# Patient Record
Sex: Male | Born: 1970
Health system: Southern US, Community
[De-identification: ages and names within clinical notes are randomized; demographics above are authoritative.]

## PROBLEM LIST (undated history)

## (undated) DIAGNOSIS — K219 Gastro-esophageal reflux disease without esophagitis: Secondary | ICD-10-CM

## (undated) DIAGNOSIS — E669 Obesity, unspecified: Secondary | ICD-10-CM

## (undated) DIAGNOSIS — E119 Type 2 diabetes mellitus without complications: Secondary | ICD-10-CM

## (undated) DIAGNOSIS — M1990 Unspecified osteoarthritis, unspecified site: Secondary | ICD-10-CM

## (undated) DIAGNOSIS — I10 Essential (primary) hypertension: Secondary | ICD-10-CM

## (undated) DIAGNOSIS — Z972 Presence of dental prosthetic device (complete) (partial): Secondary | ICD-10-CM

## (undated) DIAGNOSIS — Z9989 Dependence on other enabling machines and devices: Secondary | ICD-10-CM

## (undated) DIAGNOSIS — G4733 Obstructive sleep apnea (adult) (pediatric): Secondary | ICD-10-CM

## (undated) HISTORY — DX: Obesity, unspecified: E66.9

## (undated) HISTORY — DX: Essential (primary) hypertension: I10

## (undated) HISTORY — PX: KNEE ARTHROSCOPY W/ ACL RECONSTRUCTION: SHX1858

---

## 1998-06-23 ENCOUNTER — Emergency Department (HOSPITAL_COMMUNITY): Admission: EM | Admit: 1998-06-23 | Discharge: 1998-06-23 | Payer: Self-pay | Admitting: Emergency Medicine

## 2009-10-23 ENCOUNTER — Encounter: Admission: RE | Admit: 2009-10-23 | Discharge: 2009-10-23 | Payer: Self-pay | Admitting: Obstetrics and Gynecology

## 2009-11-08 ENCOUNTER — Encounter: Admission: RE | Admit: 2009-11-08 | Discharge: 2009-11-08 | Payer: Self-pay | Admitting: Orthopedic Surgery

## 2009-11-24 ENCOUNTER — Ambulatory Visit (HOSPITAL_COMMUNITY): Admission: RE | Admit: 2009-11-24 | Discharge: 2009-11-25 | Payer: Self-pay | Admitting: Orthopedic Surgery

## 2011-01-12 ENCOUNTER — Encounter: Payer: Self-pay | Admitting: Internal Medicine

## 2011-01-12 ENCOUNTER — Other Ambulatory Visit: Payer: Self-pay | Admitting: Internal Medicine

## 2011-01-12 ENCOUNTER — Ambulatory Visit
Admission: RE | Admit: 2011-01-12 | Discharge: 2011-01-12 | Payer: Self-pay | Source: Home / Self Care | Attending: Internal Medicine | Admitting: Internal Medicine

## 2011-01-12 DIAGNOSIS — R5383 Other fatigue: Secondary | ICD-10-CM | POA: Insufficient documentation

## 2011-01-12 DIAGNOSIS — G4733 Obstructive sleep apnea (adult) (pediatric): Secondary | ICD-10-CM | POA: Insufficient documentation

## 2011-01-12 DIAGNOSIS — I10 Essential (primary) hypertension: Secondary | ICD-10-CM | POA: Insufficient documentation

## 2011-01-12 DIAGNOSIS — R5381 Other malaise: Secondary | ICD-10-CM | POA: Insufficient documentation

## 2011-01-12 LAB — LIPID PANEL
HDL: 50.7 mg/dL (ref >39.00)
Total CHOL/HDL Ratio: 3
Triglycerides: 65 mg/dL (ref 0.0–149.0)

## 2011-01-12 LAB — CBC WITH DIFFERENTIAL/PLATELET
Basophils Absolute: 0 10*3/uL (ref 0.0–0.1)
Eosinophils Absolute: 0.2 10*3/uL (ref 0.0–0.7)
Lymphocytes Relative: 25.2 % (ref 12.0–46.0)
MCHC: 34.8 g/dL (ref 30.0–36.0)
Neutrophils Relative %: 64.5 % (ref 43.0–77.0)
RDW: 13.9 % (ref 11.5–14.6)

## 2011-01-12 LAB — HEPATIC FUNCTION PANEL
Albumin: 4.1 g/dL (ref 3.5–5.2)
Alkaline Phosphatase: 49 U/L (ref 39–117)
Bilirubin, Direct: 0.1 mg/dL (ref 0.0–0.3)

## 2011-01-12 LAB — BASIC METABOLIC PANEL
CO2: 31 mEq/L (ref 19–32)
Calcium: 9.6 mg/dL (ref 8.4–10.5)
Creatinine, Ser: 1.1 mg/dL (ref 0.4–1.5)
Glucose, Bld: 93 mg/dL (ref 70–99)

## 2011-01-12 LAB — PSA: PSA: 1.15 ng/mL (ref 0.10–4.00)

## 2011-01-13 ENCOUNTER — Encounter: Payer: Self-pay | Admitting: Internal Medicine

## 2011-01-14 ENCOUNTER — Telehealth: Payer: Self-pay | Admitting: Internal Medicine

## 2011-01-20 NOTE — Assessment & Plan Note (Signed)
Summary: new / cigna / # cd   Vital Signs:  Patient profile:   40 year old male Height:      68 inches Weight:      370 pounds BMI:     56.46 O2 Sat:      97 % on Room air Temp:     97.9 degrees F oral Pulse rate:   74 / minute Pulse rhythm:   regular Resp:     16 per minute BP sitting:   138 / 84  (left arm) Cuff size:   large  Vitals Entered By: Estell Harpin CMA (January 12, 2011 10:42 AM)  Nutrition Counseling: Patient's BMI is greater than 25 and therefore counseled on weight management options.  O2 Flow:  Room air CC: Fatigue, Hypertension Management   Primary Care Provider:  Janith Lima MD  CC:  Fatigue and Hypertension Management.  History of Present Illness:  Fatigue      This is a 40 year old man who presents with Fatigue.  The symptoms began 4-8 weeks ago.  The severity is described as mild.  The patient reports persistent fatigue, fatigue without physical limitations, and primarily motivational fatigue, but denies fatigue with vigorous exertion, fatigue with moderate exertion, fatigue with minimal exertion, and primarily physical fatigue.  The patient denies fever, night sweats, weight loss, exertional chest pain, dyspnea, cough, hemoptysis, and new medications.  Other symptoms include severe snoring and daytime sleepiness.  The patient denies the following symptoms: leg swelling, orthopnea, PND, melena, adenopathy, and skin changes.  The patient denies anhedonia, feeling depressed, altered appetite, and poor sleep.    Hypertension History:      He denies headache, chest pain, palpitations, dyspnea with exertion, orthopnea, PND, peripheral edema, visual symptoms, neurologic problems, syncope, and side effects from treatment.  He notes no problems with any antihypertensive medication side effects.        Positive major cardiovascular risk factors include hypertension.  Negative major cardiovascular risk factors include male age less than 83 years old, no history of  diabetes or hyperlipidemia, negative family history for ischemic heart disease, and non-tobacco-user status.        Further assessment for target organ damage reveals no history of ASHD, cardiac end-organ damage (CHF/LVH), stroke/TIA, peripheral vascular disease, renal insufficiency, or hypertensive retinopathy.    Preventive Screening-Counseling & Management  Alcohol-Tobacco     Alcohol drinks/day: 0     Alcohol Counseling: not indicated; patient does not drink     Smoking Status: never     Tobacco Counseling: not indicated; no tobacco use  Caffeine-Diet-Exercise     Does Patient Exercise: no  Hep-HIV-STD-Contraception     Hepatitis Risk: no risk noted     HIV Risk: no risk noted     STD Risk: no risk noted     Dental Visit-last 6 months yes     Dental Care Counseling: to seek dental care; no dental care within six months     TSE monthly: no     Testicular SE Education/Counseling to perform regular STE     Sun Exposure-Excessive: no  Safety-Violence-Falls     Seat Belt Use: yes     Helmet Use: n/a     Firearms in the Home: no firearms in the home     Smoke Detectors: yes     Violence in the Home: no risk noted      Sexual History:  currently monogamous.        Drug Use:  no.        Blood Transfusions:  no.    Clinical Review Panels:  Immunizations   Last Tetanus Booster:  Historical (12/13/2008)   Current Medications (verified): 1)  Benazepril Hcl 20 Mg Tabs (Benazepril Hcl) .... Take 1 Tablet By Mouth Once A Day  Allergies (verified): No Known Drug Allergies  Past History:  Past Medical History: Hypertension  Past Surgical History: right ACL repair  Family History: Family History of Aneurysm Aortic Family History of Arthritis Family History Breast cancer 1st degree relative <50 Family History Hypertension Family History of Prostate CA 1st degree relative <50  Social History: Married Never Smoked Alcohol use-no Drug use-no Regular  exercise-no Smoking Status:  never Drug Use:  no Does Patient Exercise:  no Hepatitis Risk:  no risk noted HIV Risk:  no risk noted STD Risk:  no risk noted Dental Care w/in 6 mos.:  yes Sun Exposure-Excessive:  no Seat Belt Use:  yes Sexual History:  currently monogamous Blood Transfusions:  no  Review of Systems       The patient complains of weight gain.  The patient denies anorexia, fever, weight loss, chest pain, syncope, dyspnea on exertion, peripheral edema, prolonged cough, headaches, hemoptysis, abdominal pain, melena, hematochezia, severe indigestion/heartburn, hematuria, muscle weakness, suspicious skin lesions, transient blindness, difficulty walking, depression, unusual weight change, abnormal bleeding, enlarged lymph nodes, angioedema, and testicular masses.   Resp:  Complains of excessive snoring, hypersomnolence, and morning headaches; denies chest discomfort, chest pain with inspiration, cough, coughing up blood, pleuritic, shortness of breath, sputum productive, and wheezing. Endo:  Complains of weight change; denies cold intolerance, excessive hunger, excessive thirst, excessive urination, heat intolerance, and polyuria.  Physical Exam  General:  alert, well-developed, well-nourished, well-hydrated, appropriate dress, cooperative to examination, good hygiene, and overweight-appearing.   Head:  normocephalic, atraumatic, no abnormalities observed, and no abnormalities palpated.   Eyes:  vision grossly intact, pupils equal, pupils round, and pupils reactive to light.   Ears:  R ear normal and L ear normal.   Nose:  External nasal examination shows no deformity or inflammation. Nasal mucosa are pink and moist without lesions or exudates. Mouth:  Oral mucosa and oropharynx without lesions or exudates.  Teeth in good repair. Neck:  supple, full ROM, no masses, no thyromegaly, no thyroid nodules or tenderness, no JVD, no HJR, normal carotid upstroke, no carotid bruits, no  cervical lymphadenopathy, and no neck tenderness.   Breasts:  No masses or gynecomastia noted Lungs:  normal respiratory effort, no intercostal retractions, no accessory muscle use, normal breath sounds, no dullness, no fremitus, no crackles, and no wheezes.   Heart:  normal rate, regular rhythm, no murmur, no gallop, no rub, and no JVD.   Abdomen:  soft, non-tender, normal bowel sounds, no distention, no masses, no guarding, no rigidity, no rebound tenderness, no abdominal hernia, no inguinal hernia, no hepatomegaly, and no splenomegaly.   Rectal:  No external abnormalities noted. Normal sphincter tone. No rectal masses or tenderness. Genitalia:  uncircumcised, no hydrocele, no varicocele, no scrotal masses, no testicular masses or atrophy, no cutaneous lesions, and no urethral discharge.   Prostate:  Prostate gland firm and smooth, no enlargement, nodularity, tenderness, mass, asymmetry or induration. Msk:  No deformity or scoliosis noted of thoracic or lumbar spine.   Pulses:  R and L carotid,radial,femoral,dorsalis pedis and posterior tibial pulses are full and equal bilaterally Extremities:  No clubbing, cyanosis, edema, or deformity noted with normal full range of motion of all  joints.   Neurologic:  No cranial nerve deficits noted. Station and gait are normal. Plantar reflexes are down-going bilaterally. DTRs are symmetrical throughout. Sensory, motor and coordinative functions appear intact. Skin:  Intact without suspicious lesions or rashes Cervical Nodes:  no anterior cervical adenopathy and no posterior cervical adenopathy.   Axillary Nodes:  no R axillary adenopathy and no L axillary adenopathy.   Inguinal Nodes:  no R inguinal adenopathy and no L inguinal adenopathy.   Psych:  Cognition and judgment appear intact. Alert and cooperative with normal attention span and concentration. No apparent delusions, illusions, hallucinations   Impression & Recommendations:  Problem # 1:   HYPERTENSION, BENIGN ESSENTIAL (ICD-401.1) Assessment Improved  His updated medication list for this problem includes:    Benazepril Hcl 20 Mg Tabs (Benazepril hcl) .Marland Kitchen... Take 1 tablet by mouth once a day  BP today: 138/84  10 Yr Risk Heart Disease: Not enough information  Orders: Venipuncture (45997) TLB-Lipid Panel (80061-LIPID) TLB-BMP (Basic Metabolic Panel-BMET) (74142-LTRVUYE) TLB-CBC Platelet - w/Differential (85025-CBCD) TLB-Hepatic/Liver Function Pnl (80076-HEPATIC) TLB-TSH (Thyroid Stimulating Hormone) (84443-TSH) TLB-A1C / Hgb A1C (Glycohemoglobin) (83036-A1C) EKG w/ Interpretation (93000)  Problem # 2:  ROUTINE GENERAL MEDICAL EXAM@HEALTH  CARE FACL (ICD-V70.0)  Orders: TLB-PSA (Prostate Specific Antigen) (84153-PSA)  Td Booster: Historical (12/13/2008)    Discussed using sunscreen, use of alcohol, drug use, self testicular exam, routine dental care, routine eye care, routine physical exam, seat belts, multiple vitamins, and recommendations for immunizations.  Discussed exercise and checking cholesterol.  Also recommend checking PSA.  Problem # 3:  SLEEP APNEA (ICD-780.57) Assessment: New  Orders: Venipuncture (33435) TLB-Lipid Panel (80061-LIPID) TLB-BMP (Basic Metabolic Panel-BMET) (68616-OHFGBMS) TLB-CBC Platelet - w/Differential (85025-CBCD) TLB-Hepatic/Liver Function Pnl (80076-HEPATIC) TLB-TSH (Thyroid Stimulating Hormone) (84443-TSH) TLB-A1C / Hgb A1C (Glycohemoglobin) (83036-A1C) Sleep Disorder Referral (Sleep Disorder)  Problem # 4:  OBESITY (ICD-278.00) Assessment: Deteriorated  Orders: Venipuncture (11155) TLB-Lipid Panel (80061-LIPID) TLB-BMP (Basic Metabolic Panel-BMET) (20802-MVVKPQA) TLB-CBC Platelet - w/Differential (85025-CBCD) TLB-Hepatic/Liver Function Pnl (80076-HEPATIC) TLB-TSH (Thyroid Stimulating Hormone) (84443-TSH) TLB-A1C / Hgb A1C (Glycohemoglobin) (83036-A1C)  Ht: 68 (01/12/2011)   Wt: 370 (01/12/2011)   BMI: 56.46  (01/12/2011)  Problem # 5:  FATIGUE (ICD-780.79) Assessment: New will check for secondary causes, I think this will be due to his sleep apnea Orders: Venipuncture (44975) TLB-Lipid Panel (80061-LIPID) TLB-BMP (Basic Metabolic Panel-BMET) (30051-TMYTRZN) TLB-CBC Platelet - w/Differential (85025-CBCD) TLB-Hepatic/Liver Function Pnl (80076-HEPATIC) TLB-TSH (Thyroid Stimulating Hormone) (84443-TSH) TLB-A1C / Hgb A1C (Glycohemoglobin) (83036-A1C)  Complete Medication List: 1)  Benazepril Hcl 20 Mg Tabs (Benazepril hcl) .... Take 1 tablet by mouth once a day  Hypertension Assessment/Plan:      The patient's hypertensive risk group is category A: No risk factors and no target organ damage.  Today's blood pressure is 138/84.  His blood pressure goal is < 140/90.   Patient Instructions: 1)  Please schedule a follow-up appointment in 1 month. 2)  It is important that you exercise regularly at least 20 minutes 5 times a week. If you develop chest pain, have severe difficulty breathing, or feel very tired , stop exercising immediately and seek medical attention. 3)  You need to lose weight. Consider a lower calorie diet and regular exercise.  4)  Check your Blood Pressure regularly. If it is above 140/90: you should make an appointment. Prescriptions: BENAZEPRIL HCL 20 MG TABS (BENAZEPRIL HCL) Take 1 tablet by mouth once a day  #30 x 11   Entered and Authorized by:   Janith Lima MD   Signed  by:   Janith Lima MD on 01/12/2011   Method used:   Electronically to        [facility] [phone]* (retail)       [address]       [place], [place]  [address]       Ph: [phone]       Fax: [phone]   RxID:   [id number]    Orders Added: 1)  Venipuncture [41287] 2)  TLB-Lipid Panel [80061-LIPID] 3)  TLB-BMP (Basic Metabolic Panel-BMET) [86767-MCNOBSJ] 4)  TLB-CBC Platelet - w/Differential [85025-CBCD] 5)  TLB-Hepatic/Liver Function Pnl [80076-HEPATIC] 6)  TLB-TSH  (Thyroid Stimulating Hormone) [84443-TSH] 7)  TLB-A1C / Hgb A1C (Glycohemoglobin) [83036-A1C] 8)  TLB-PSA (Prostate Specific Antigen) [84153-PSA] 9)  Sleep Disorder Referral [Sleep Disorder] 10)  EKG w/ Interpretation [93000] 11)  New Patient Level IV [99204] 12)  New Patient 18-39 years [99385]   Immunization History:  Tetanus/Td Immunization History:    Tetanus/Td:  historical (12/13/2008)   Immunization History:  Tetanus/Td Immunization History:    Tetanus/Td:  Historical (12/13/2008)

## 2011-01-20 NOTE — Letter (Signed)
Summary: Lipid Letter  [facility]   [place], [place] [address]   Phone: [phone]  Fax: [phone]    01/13/2011  [facility] [address] [place], [place]  [address]  Dear Joseph Wood:  We have carefully reviewed your last lipid profile from 01/12/2011 and the results are noted below with a summary of recommendations for lipid management.    Cholesterol:       177     Goal: <200   HDL "good" Cholesterol:   50.70     Goal: >40   LDL "bad" Cholesterol:   113     Goal: <100   Triglycerides:       65.0     Goal: <150        TLC Diet (Therapeutic Lifestyle Change): Saturated Fats & Transfatty acids should be kept < 7% of total calories ***Reduce Saturated Fats Polyunstaurated Fat can be up to 10% of total calories Monounsaturated Fat Fat can be up to 20% of total calories Total Fat should be no greater than 25-35% of total calories Carbohydrates should be 50-60% of total calories Protein should be approximately 15% of total calories Fiber should be at least 20-30 grams a day ***Increased fiber may help lower LDL Total Cholesterol should be < 257m/day Consider adding plant stanol/sterols to diet (example: Benacol spread) ***A higher intake of unsaturated fat may reduce Triglycerides and Increase HDL    Adjunctive Measures (may lower LIPIDS and reduce risk of Heart Attack) include: Aerobic Exercise (20-30 minutes 3-4 times a week) Limit Alcohol Consumption Weight Reduction Aspirin 75-81 mg a day by mouth (if not allergic or contraindicated) Dietary Fiber 20-30 grams a day by mouth     Current Medications: 1)    Benazepril Hcl 20 Mg Tabs (Benazepril hcl) .... Take 1 tablet by mouth once a day  If you have any questions, please call. We appreciate being able to work with you.   Sincerely,    [facility] TJanith LimaMD

## 2011-01-20 NOTE — Letter (Signed)
Summary: Results Follow-up Letter  [facility]   [place], [place] [address]   Phone: [phone]  Fax: [phone]    01/13/2011  [place], [place]  [address]  Dear Mr. Mcvicar,   The following are the results of your recent test(s):  Test     Result     Blood sugars   early type II diabetes Prostate     normal Liver/kidney   normal Thyroid     normal CBC       normal   _________________________________________________________  Please call for an appointment soon _________________________________________________________ _________________________________________________________ _________________________________________________________  Sincerely,  Scarlette Calico MD [facility]

## 2011-01-20 NOTE — Progress Notes (Signed)
Summary: pt req lab results  Phone Note Call from Patient   Caller: [phone] Call For: Janith Lima MD Summary of Call: Pt requests a call regarding lab results. Initial call taken by: Denice Paradise,  January 14, 2011 11:42 AM  Follow-up for Phone Call        Patient notified of lab result and transferred to schedulers to make a follow up appt.Marland KitchenMarland KitchenSterling  January 14, 2011 1:23 PM

## 2011-02-01 ENCOUNTER — Ambulatory Visit: Payer: Self-pay | Admitting: Internal Medicine

## 2011-02-05 ENCOUNTER — Institutional Professional Consult (permissible substitution): Payer: Self-pay | Admitting: Pulmonary Disease

## 2011-02-08 ENCOUNTER — Telehealth: Payer: Self-pay | Admitting: Pulmonary Disease

## 2011-02-18 NOTE — Progress Notes (Signed)
Summary: nos appt  Phone Note Call from Patient   Caller: [email or web address]  Call For: Joseph Wood Summary of Call: In ref to nos from 2/24, pt states he won't be rescheduling. Initial call taken by: Netta Neat,  February 08, 2011 11:22 AM

## 2011-03-16 LAB — BASIC METABOLIC PANEL
Chloride: 104 mEq/L (ref 96–112)
Creatinine, Ser: 0.93 mg/dL (ref 0.4–1.5)
GFR calc Af Amer: >60 mL/min (ref >60)
GFR calc non Af Amer: >60 mL/min (ref >60)
Potassium: 4.1 mEq/L (ref 3.5–5.1)

## 2011-03-16 LAB — DIFFERENTIAL
Eosinophils Absolute: 0.2 10*3/uL (ref 0.0–0.7)
Lymphocytes Relative: 18 % (ref 12–46)
Lymphs Abs: 1.3 10*3/uL (ref 0.7–4.0)
Monocytes Relative: 9 % (ref 3–12)
Neutrophils Relative %: 71 % (ref 43–77)

## 2011-03-16 LAB — CBC
MCV: 89.5 fL (ref 78.0–100.0)
RBC: 5.2 MIL/uL (ref 4.22–5.81)
WBC: 7 10*3/uL (ref 4.0–10.5)

## 2011-03-16 LAB — TYPE AND SCREEN: Antibody Screen: NEGATIVE

## 2011-03-16 LAB — ABO/RH: ABO/RH(D): O POS

## 2011-04-26 ENCOUNTER — Ambulatory Visit: Payer: Self-pay | Admitting: Internal Medicine

## 2011-06-15 ENCOUNTER — Ambulatory Visit (INDEPENDENT_AMBULATORY_CARE_PROVIDER_SITE_OTHER): Payer: Managed Care, Other (non HMO) | Admitting: Internal Medicine

## 2011-06-15 ENCOUNTER — Other Ambulatory Visit (INDEPENDENT_AMBULATORY_CARE_PROVIDER_SITE_OTHER): Payer: Managed Care, Other (non HMO)

## 2011-06-15 ENCOUNTER — Encounter: Payer: Self-pay | Admitting: Internal Medicine

## 2011-06-15 DIAGNOSIS — R5381 Other malaise: Secondary | ICD-10-CM

## 2011-06-15 DIAGNOSIS — G4730 Sleep apnea, unspecified: Secondary | ICD-10-CM

## 2011-06-15 DIAGNOSIS — I10 Essential (primary) hypertension: Secondary | ICD-10-CM

## 2011-06-15 DIAGNOSIS — R5383 Other fatigue: Secondary | ICD-10-CM

## 2011-06-15 DIAGNOSIS — R7309 Other abnormal glucose: Secondary | ICD-10-CM

## 2011-06-15 DIAGNOSIS — R739 Hyperglycemia, unspecified: Secondary | ICD-10-CM

## 2011-06-15 DIAGNOSIS — E118 Type 2 diabetes mellitus with unspecified complications: Secondary | ICD-10-CM | POA: Insufficient documentation

## 2011-06-15 DIAGNOSIS — E669 Obesity, unspecified: Secondary | ICD-10-CM

## 2011-06-15 LAB — CBC WITH DIFFERENTIAL/PLATELET
Basophils Absolute: 0 10*3/uL (ref 0.0–0.1)
Basophils Relative: 0.4 % (ref 0.0–3.0)
Eosinophils Absolute: 0.3 10*3/uL (ref 0.0–0.7)
HCT: 45.6 % (ref 39.0–52.0)
Hemoglobin: 15.6 g/dL (ref 13.0–17.0)
Lymphs Abs: 1.9 10*3/uL (ref 0.7–4.0)
MCHC: 34.1 g/dL (ref 30.0–36.0)
MCV: 90.8 fl (ref 78.0–100.0)
Monocytes Absolute: 0.6 10*3/uL (ref 0.1–1.0)
Neutro Abs: 5 10*3/uL (ref 1.4–7.7)
RBC: 5.02 Mil/uL (ref 4.22–5.81)
RDW: 13.3 % (ref 11.5–14.6)

## 2011-06-15 LAB — COMPREHENSIVE METABOLIC PANEL
AST: 19 U/L (ref 0–37)
Alkaline Phosphatase: 52 U/L (ref 39–117)
BUN: 16 mg/dL (ref 6–23)
Calcium: 9.6 mg/dL (ref 8.4–10.5)
Creatinine, Ser: 1 mg/dL (ref 0.4–1.5)
Glucose, Bld: 97 mg/dL (ref 70–99)

## 2011-06-15 LAB — LIPID PANEL
Cholesterol: 164 mg/dL (ref 0–200)
LDL Cholesterol: 101 mg/dL — ABNORMAL HIGH (ref 0–99)
Triglycerides: 82 mg/dL (ref 0.0–149.0)

## 2011-06-15 LAB — URINALYSIS, ROUTINE W REFLEX MICROSCOPIC
Bilirubin Urine: NEGATIVE
Hgb urine dipstick: NEGATIVE
Leukocytes, UA: NEGATIVE
Nitrite: NEGATIVE
Total Protein, Urine: NEGATIVE
pH: 5.5 (ref 5.0–8.0)

## 2011-06-15 LAB — TSH: TSH: 2.97 u[IU]/mL (ref 0.35–5.50)

## 2011-06-15 MED ORDER — BENAZEPRIL HCL 20 MG PO TABS: 20.0000 mg | ORAL_TABLET | Freq: Every day | ORAL | Status: DC

## 2011-06-15 NOTE — Assessment & Plan Note (Signed)
No changes on this

## 2011-06-15 NOTE — Assessment & Plan Note (Signed)
His BP is well controlled, today I will check his lytes and renal function

## 2011-06-15 NOTE — Progress Notes (Signed)
Subjective:    Patient ID: Joseph Wood, male    DOB: October 21, 1971, 40 y.o.   MRN: [id number]  Hypertension This is a chronic problem. The current episode started more than 1 year ago. The problem has been gradually improving since onset. The problem is controlled. Pertinent negatives include no anxiety, blurred vision, chest pain, headaches, malaise/fatigue, neck pain, orthopnea, palpitations, peripheral edema, PND, shortness of breath or sweats. Past treatments include ACE inhibitors. The current treatment provides significant improvement. Compliance problems include diet and exercise.       Review of Systems  Constitutional: Negative for fever, chills, malaise/fatigue, diaphoresis, activity change, appetite change, fatigue and unexpected weight change.  HENT: Negative for sore throat, facial swelling, trouble swallowing, neck pain, neck stiffness and voice change.   Eyes: Negative for blurred vision, photophobia, redness and visual disturbance.  Respiratory: Positive for apnea. Negative for cough, choking, chest tightness, shortness of breath, wheezing and stridor.   Cardiovascular: Negative for chest pain, palpitations, orthopnea, leg swelling and PND.  Gastrointestinal: Negative for nausea, vomiting, abdominal pain, diarrhea, constipation, blood in stool and anal bleeding.  Genitourinary: Negative for dysuria, urgency, frequency, hematuria, flank pain, decreased urine volume, enuresis and difficulty urinating.  Musculoskeletal: Negative for myalgias, back pain, joint swelling, arthralgias and gait problem.  Skin: Negative for color change, pallor, rash and wound.  Neurological: Negative for dizziness, tremors, seizures, syncope, facial asymmetry, speech difficulty, weakness, light-headedness, numbness and headaches.  Hematological: Negative for adenopathy. Does not bruise/bleed easily.  Psychiatric/Behavioral: Negative.        Objective:   Physical Exam  Vitals  reviewed. Constitutional: He is oriented to person, place, and time. He appears well-developed and well-nourished. No distress.  HENT:  Head: Normocephalic and atraumatic.  Right Ear: External ear normal.  Left Ear: External ear normal.  Nose: Nose normal.  Mouth/Throat: Oropharynx is clear and moist. No oropharyngeal exudate.  Eyes: Conjunctivae and EOM are normal. Pupils are equal, round, and reactive to light. Right eye exhibits no discharge. Left eye exhibits no discharge. No scleral icterus.  Neck: Normal range of motion. Neck supple. No JVD present. No tracheal deviation present. No thyromegaly present.  Cardiovascular: Normal rate, regular rhythm, normal heart sounds and intact distal pulses.  Exam reveals no gallop and no friction rub.   No murmur heard. Pulmonary/Chest: Effort normal and breath sounds normal. No stridor. No respiratory distress. He has no wheezes. He has no rales. He exhibits no tenderness.  Abdominal: Soft. Bowel sounds are normal. He exhibits no distension and no mass. There is no tenderness. There is no rebound and no guarding.  Musculoskeletal: Normal range of motion. He exhibits no edema and no tenderness.  Lymphadenopathy:    He has no cervical adenopathy.  Neurological: He is alert and oriented to person, place, and time. He has normal reflexes. No cranial nerve deficit. He exhibits normal muscle tone. Coordination normal.  Skin: Skin is warm and dry. No rash noted. He is not diaphoretic. No erythema. No pallor.  Psychiatric: He has a normal mood and affect. His behavior is normal. Judgment and thought content normal.        Lab Results  Component Value Date   WBC 7.7 01/12/2011   HGB 15.8 01/12/2011   HCT 45.4 01/12/2011   PLT 229.0 01/12/2011   CHOL 177 01/12/2011   TRIG 65.0 01/12/2011   HDL 50.70 01/12/2011   ALT 26 01/12/2011   AST 25 01/12/2011   NA 143 01/12/2011   K 4.5 01/12/2011  CL 105 01/12/2011   CREATININE 1.1 01/12/2011   BUN 16 01/12/2011    CO2 31 01/12/2011   TSH 1.85 01/12/2011   PSA 1.15 01/12/2011   HGBA1C 6.1 01/12/2011    Assessment & Plan:

## 2011-06-15 NOTE — Assessment & Plan Note (Signed)
His last A1C was 6.1, I will recheck that number today

## 2011-06-15 NOTE — Assessment & Plan Note (Signed)
He tells me that he is not treating the sleep apnea so I have referred him for a sleep evaluation

## 2011-06-15 NOTE — Patient Instructions (Signed)

## 2011-06-16 ENCOUNTER — Encounter: Payer: Self-pay | Admitting: Internal Medicine

## 2011-07-16 ENCOUNTER — Institutional Professional Consult (permissible substitution): Payer: Managed Care, Other (non HMO) | Admitting: Pulmonary Disease

## 2011-08-10 ENCOUNTER — Institutional Professional Consult (permissible substitution): Payer: Managed Care, Other (non HMO) | Admitting: Pulmonary Disease

## 2011-11-19 ENCOUNTER — Encounter: Payer: Self-pay | Admitting: Internal Medicine

## 2011-11-19 ENCOUNTER — Ambulatory Visit (INDEPENDENT_AMBULATORY_CARE_PROVIDER_SITE_OTHER): Payer: Managed Care, Other (non HMO) | Admitting: Internal Medicine

## 2011-11-19 VITALS — BP 120/70 | HR 61 | Temp 98.7°F | Resp 16

## 2011-11-19 DIAGNOSIS — S335XXA Sprain of ligaments of lumbar spine, initial encounter: Secondary | ICD-10-CM

## 2011-11-19 DIAGNOSIS — S139XXA Sprain of joints and ligaments of unspecified parts of neck, initial encounter: Secondary | ICD-10-CM

## 2011-11-19 DIAGNOSIS — I10 Essential (primary) hypertension: Secondary | ICD-10-CM

## 2011-11-19 DIAGNOSIS — S39012A Strain of muscle, fascia and tendon of lower back, initial encounter: Secondary | ICD-10-CM | POA: Insufficient documentation

## 2011-11-19 DIAGNOSIS — S161XXA Strain of muscle, fascia and tendon at neck level, initial encounter: Secondary | ICD-10-CM | POA: Insufficient documentation

## 2011-11-19 MED ORDER — METHOCARBAMOL 500 MG PO TABS: 500.0000 mg | ORAL_TABLET | Freq: Four times a day (QID) | ORAL | Status: AC

## 2011-11-19 MED ORDER — NAPROXEN 500 MG PO TABS: 500.0000 mg | ORAL_TABLET | Freq: Two times a day (BID) | ORAL | Status: DC

## 2011-11-19 NOTE — Patient Instructions (Signed)
Back Pain, Adult Low back pain is very common. About 1 in 5 people have back pain.The cause of low back pain is rarely dangerous. The pain often gets better over time.About half of people with a sudden onset of back pain feel better in just 2 weeks. About 8 in 10 people feel better by 6 weeks.  CAUSES Some common causes of back pain include:  Strain of the muscles or ligaments supporting the spine.   Wear and tear (degeneration) of the spinal discs.   Arthritis.   Direct injury to the back.  DIAGNOSIS Most of the time, the direct cause of low back pain is not known.However, back pain can be treated effectively even when the exact cause of the pain is unknown.Answering your caregiver's questions about your overall health and symptoms is one of the most accurate ways to make sure the cause of your pain is not dangerous. If your caregiver needs more information, he or she may order lab work or imaging tests (X-rays or MRIs).However, even if imaging tests show changes in your back, this usually does not require surgery. HOME CARE INSTRUCTIONS For many people, back pain returns.Since low back pain is rarely dangerous, it is often a condition that people can learn to [facility] their own.   Remain active. It is stressful on the back to sit or stand in one place. Do not sit, drive, or stand in one place for more than 30 minutes at a time. Take short walks on level surfaces as soon as pain allows.Try to increase the length of time you walk each day.   Do not stay in bed.Resting more than 1 or 2 days can delay your recovery.   Do not avoid exercise or work.Your body is made to move.It is not dangerous to be active, even though your back may hurt.Your back will likely heal faster if you return to being active before your pain is gone.   Pay attention to your body when you bend and lift. Many people have less discomfortwhen lifting if they bend their knees, keep the load close to their  bodies,and avoid twisting. Often, the most comfortable positions are those that put less stress on your recovering back.   Find a comfortable position to sleep. Use a firm mattress and lie on your side with your knees slightly bent. If you lie on your back, put a pillow under your knees.   Only take over-the-counter or prescription medicines as directed by your caregiver. Over-the-counter medicines to reduce pain and inflammation are often the most helpful.Your caregiver may prescribe muscle relaxant drugs.These medicines help dull your pain so you can more quickly return to your normal activities and healthy exercise.   Put ice on the injured area.   Put ice in a plastic bag.   Place a towel between your skin and the bag.   Leave the ice on for 15 to 20 minutes, 3 to 4 times a day for the first 2 to 3 days. After that, ice and heat may be alternated to reduce pain and spasms.   Ask your caregiver about trying back exercises and gentle massage. This may be of some benefit.   Avoid feeling anxious or stressed.Stress increases muscle tension and can worsen back pain.It is important to recognize when you are anxious or stressed and learn ways to manage it.Exercise is a great option.  SEEK MEDICAL CARE IF:  You have pain that is not relieved with rest or medicine.   You have  pain that does not improve in 1 week.   You have new symptoms.   You are generally not feeling well.  SEEK IMMEDIATE MEDICAL CARE IF:   You have pain that radiates from your back into your legs.   You develop new bowel or bladder control problems.   You have unusual weakness or numbness in your arms or legs.   You develop nausea or vomiting.   You develop abdominal pain.   You feel faint.  Document Released: 11/29/2005 Document Revised: 08/11/2011 Document Reviewed: 04/19/2011 [facility] Patient Information 2012 [place] [place].

## 2011-11-21 ENCOUNTER — Encounter: Payer: Self-pay | Admitting: Internal Medicine

## 2011-11-21 NOTE — Progress Notes (Signed)
Subjective:    Patient ID: Joseph Wood, male    DOB: 03-10-1971, 40 y.o.   MRN: [id number]  Back Pain This is a new problem. The current episode started in the past 7 days. The problem occurs intermittently. The problem is unchanged. The pain is present in the lumbar spine. The quality of the pain is described as aching. The pain radiates to the right thigh. The pain is at a severity of 2/10. The pain is mild. The pain is worse during the day. The symptoms are aggravated by bending. Stiffness is present in the morning. Pertinent negatives include no abdominal pain, bladder incontinence, bowel incontinence, chest pain, dysuria, fever, headaches, leg pain, numbness, paresis, paresthesias, pelvic pain, perianal numbness, tingling, weakness or weight loss. Risk factors include obesity and lack of exercise. He has tried analgesics for the symptoms. The treatment provided moderate relief.  Neck Pain  This is a new problem. The current episode started in the past 7 days. The problem occurs intermittently. The problem has been gradually improving. The pain is associated with nothing. The pain is present in the midline. The quality of the pain is described as aching. The pain is at a severity of 2/10. The pain is moderate. The symptoms are aggravated by nothing. The pain is worse during the day. Stiffness is present all day. Pertinent negatives include no chest pain, fever, headaches, leg pain, numbness, paresis, photophobia, syncope, tingling, trouble swallowing, visual change, weakness or weight loss. He has tried acetaminophen for the symptoms. The treatment provided moderate relief.      Review of Systems  Constitutional: Negative for fever, chills, weight loss, diaphoresis, activity change, appetite change, fatigue and unexpected weight change.  HENT: Positive for neck pain. Negative for sore throat, trouble swallowing and voice change.   Eyes: Negative.  Negative for photophobia.  Respiratory:  Negative for cough, shortness of breath, wheezing and stridor.   Cardiovascular: Negative for chest pain, palpitations, leg swelling and syncope.  Gastrointestinal: Negative for nausea, vomiting, abdominal pain, diarrhea, constipation, blood in stool, abdominal distention and bowel incontinence.  Genitourinary: Negative for bladder incontinence, dysuria, urgency, frequency, hematuria, flank pain, decreased urine volume, enuresis, difficulty urinating and pelvic pain.  Musculoskeletal: Positive for back pain. Negative for myalgias, joint swelling, arthralgias and gait problem.  Skin: Negative for color change, pallor, rash and wound.  Neurological: Negative for dizziness, tingling, tremors, seizures, syncope, facial asymmetry, speech difficulty, weakness, light-headedness, numbness, headaches and paresthesias.  Hematological: Negative for adenopathy. Does not bruise/bleed easily.  Psychiatric/Behavioral: Negative.        Objective:   Physical Exam  Vitals reviewed. Constitutional: He is oriented to person, place, and time. He appears well-developed and well-nourished. No distress.  HENT:  Head: Normocephalic and atraumatic.  Mouth/Throat: Oropharynx is clear and moist. No oropharyngeal exudate.  Eyes: Conjunctivae are normal. Right eye exhibits no discharge. Left eye exhibits no discharge. No scleral icterus.  Neck: Normal range of motion. Neck supple. No JVD present. No tracheal deviation present. No thyromegaly present.  Cardiovascular: Normal rate, regular rhythm, normal heart sounds and intact distal pulses.  Exam reveals no gallop and no friction rub.   No murmur heard. Pulmonary/Chest: Effort normal and breath sounds normal. No stridor. No respiratory distress. He has no wheezes. He has no rales. He exhibits no tenderness.  Abdominal: Soft. Bowel sounds are normal. He exhibits no distension and no mass. There is no tenderness. There is no rebound and no guarding.  Musculoskeletal:  Normal range of motion.  He exhibits no edema and no tenderness.       Cervical back: Normal. He exhibits normal range of motion, no tenderness, no bony tenderness, no swelling, no edema, no deformity, no laceration, no pain, no spasm and normal pulse.       Lumbar back: Normal. He exhibits normal range of motion, no tenderness, no bony tenderness, no swelling, no edema, no deformity, no laceration, no pain, no spasm and normal pulse.  Lymphadenopathy:    He has no cervical adenopathy.  Neurological: He is alert and oriented to person, place, and time. He has normal strength. He displays no atrophy, no tremor and normal reflexes. No cranial nerve deficit or sensory deficit. He exhibits normal muscle tone. He displays a negative Romberg sign. He displays no seizure activity. Coordination and gait normal. He displays no Babinski's sign on the right side. He displays no Babinski's sign on the left side.  Reflex Scores:      Tricep reflexes are 1+ on the right side and 1+ on the left side.      Bicep reflexes are 1+ on the right side and 1+ on the left side.      Brachioradialis reflexes are 1+ on the right side and 1+ on the left side.      Patellar reflexes are 1+ on the right side and 1+ on the left side.      Achilles reflexes are 1+ on the right side and 1+ on the left side.      -SLR in both legs  Skin: Skin is warm and dry. No rash noted. He is not diaphoretic. No erythema. No pallor.  Psychiatric: He has a normal mood and affect. His behavior is normal. Judgment and thought content normal.          Assessment & Plan:

## 2011-11-21 NOTE — Assessment & Plan Note (Signed)
There is no evidence of radiculopathy and the pain is improving so I will offer meds for pain and I gave him pt ed material

## 2011-11-21 NOTE — Assessment & Plan Note (Signed)
There is no evidence of radiculopathy and the pain is improving so I will offer meds for pain and spasm

## 2011-11-21 NOTE — Assessment & Plan Note (Signed)
Start meds for pain

## 2012-01-14 ENCOUNTER — Other Ambulatory Visit: Payer: Self-pay | Admitting: Internal Medicine

## 2012-08-20 ENCOUNTER — Other Ambulatory Visit: Payer: Self-pay | Admitting: Internal Medicine

## 2012-09-05 ENCOUNTER — Other Ambulatory Visit (INDEPENDENT_AMBULATORY_CARE_PROVIDER_SITE_OTHER): Payer: PRIVATE HEALTH INSURANCE

## 2012-09-05 ENCOUNTER — Ambulatory Visit (INDEPENDENT_AMBULATORY_CARE_PROVIDER_SITE_OTHER): Payer: PRIVATE HEALTH INSURANCE | Admitting: Internal Medicine

## 2012-09-05 ENCOUNTER — Encounter: Payer: Self-pay | Admitting: Internal Medicine

## 2012-09-05 VITALS — BP 140/80 | HR 66 | Temp 97.6°F | Resp 16 | Ht 69.0 in | Wt 367.5 lb

## 2012-09-05 DIAGNOSIS — K921 Melena: Secondary | ICD-10-CM

## 2012-09-05 DIAGNOSIS — Z23 Encounter for immunization: Secondary | ICD-10-CM

## 2012-09-05 DIAGNOSIS — I10 Essential (primary) hypertension: Secondary | ICD-10-CM

## 2012-09-05 DIAGNOSIS — R7309 Other abnormal glucose: Secondary | ICD-10-CM

## 2012-09-05 DIAGNOSIS — R739 Hyperglycemia, unspecified: Secondary | ICD-10-CM

## 2012-09-05 DIAGNOSIS — Z0000 Encounter for general adult medical examination without abnormal findings: Secondary | ICD-10-CM | POA: Insufficient documentation

## 2012-09-05 DIAGNOSIS — G4730 Sleep apnea, unspecified: Secondary | ICD-10-CM

## 2012-09-05 LAB — CBC WITH DIFFERENTIAL/PLATELET
Basophils Relative: 0.4 % (ref 0.0–3.0)
Eosinophils Relative: 3.2 % (ref 0.0–5.0)
HCT: 46.4 % (ref 39.0–52.0)
Monocytes Relative: 8.7 % (ref 3.0–12.0)
Neutrophils Relative %: 63.1 % (ref 43.0–77.0)
Platelets: 256 10*3/uL (ref 150.0–400.0)
RBC: 5.12 Mil/uL (ref 4.22–5.81)
WBC: 7.9 10*3/uL (ref 4.5–10.5)

## 2012-09-05 LAB — COMPREHENSIVE METABOLIC PANEL
Albumin: 4 g/dL (ref 3.5–5.2)
CO2: 30 mEq/L (ref 19–32)
Calcium: 9.8 mg/dL (ref 8.4–10.5)
GFR: 100 mL/min (ref >60.00)
Glucose, Bld: 98 mg/dL (ref 70–99)
Potassium: 4.5 mEq/L (ref 3.5–5.1)
Sodium: 143 mEq/L (ref 135–145)
Total Protein: 7.1 g/dL (ref 6.0–8.3)

## 2012-09-05 LAB — URINALYSIS, ROUTINE W REFLEX MICROSCOPIC
Bilirubin Urine: NEGATIVE
Leukocytes, UA: NEGATIVE
Nitrite: NEGATIVE
Specific Gravity, Urine: 1.02 (ref 1.000–1.030)
pH: 6 (ref 5.0–8.0)

## 2012-09-05 LAB — HEMOGLOBIN A1C: Hgb A1c MFr Bld: 5.7 % (ref 4.6–6.5)

## 2012-09-05 LAB — PSA: PSA: 0.78 ng/mL (ref 0.10–4.00)

## 2012-09-05 MED ORDER — BENAZEPRIL HCL 20 MG PO TABS: 20.0000 mg | ORAL_TABLET | Freq: Every day | ORAL | Status: DC

## 2012-09-05 NOTE — Assessment & Plan Note (Signed)
His BP is well controlled, I will check his lytes and renal function 

## 2012-09-05 NOTE — Assessment & Plan Note (Signed)
I will check his A1C today to see if he has developed DM II

## 2012-09-05 NOTE — Assessment & Plan Note (Signed)
Exam done, vaccines were updated, labs ordered, pt ed material was given

## 2012-09-05 NOTE — Progress Notes (Signed)
Subjective:    Patient ID: Joseph Wood, male    DOB: [birth year], 41 y.o.   MRN: [id number]  Hypertension This is a chronic problem. The current episode started more than 1 year ago. The problem has been gradually improving since onset. The problem is controlled. Pertinent negatives include no anxiety, blurred vision, chest pain, headaches, malaise/fatigue, neck pain, orthopnea, palpitations, peripheral edema, PND, shortness of breath or sweats. There are no associated agents to hypertension. Risk factors for coronary artery disease include male gender and obesity. Past treatments include ACE inhibitors. The current treatment provides moderate improvement. Compliance problems include exercise, diet and psychosocial issues.  Identifiable causes of hypertension include sleep apnea.      Review of Systems  Constitutional: Negative for fever, chills, malaise/fatigue, diaphoresis, activity change, appetite change, fatigue and unexpected weight change.  HENT: Negative.  Negative for neck pain.   Eyes: Negative.  Negative for blurred vision.  Respiratory: Positive for apnea. Negative for cough, choking, chest tightness, shortness of breath, wheezing and stridor.   Cardiovascular: Negative for chest pain, palpitations, orthopnea, leg swelling and PND.  Gastrointestinal: Negative for nausea, vomiting, abdominal pain, diarrhea, constipation, blood in stool, anal bleeding and rectal pain.  Genitourinary: Negative.   Musculoskeletal: Negative.   Skin: Negative.   Neurological: Negative.  Negative for headaches.  Hematological: Negative for adenopathy. Does not bruise/bleed easily.  Psychiatric/Behavioral: Negative.        Objective:   Physical Exam  Vitals reviewed. Constitutional: He is oriented to person, place, and time. He appears well-developed and well-nourished. No distress.  HENT:  Head: Normocephalic and atraumatic.  Mouth/Throat: Oropharynx is clear and moist. No oropharyngeal  exudate.  Eyes: Conjunctivae normal are normal. Right eye exhibits no discharge. Left eye exhibits no discharge. No scleral icterus.  Neck: Normal range of motion. Neck supple. No JVD present. No tracheal deviation present. No thyromegaly present.  Cardiovascular: Normal rate, regular rhythm, normal heart sounds and intact distal pulses.  Exam reveals no gallop and no friction rub.   No murmur heard. Pulmonary/Chest: Effort normal and breath sounds normal. No stridor. No respiratory distress. He has no wheezes. He has no rales. He exhibits no tenderness.  Abdominal: Soft. Bowel sounds are normal. He exhibits no distension and no mass. There is no tenderness. There is no rebound and no guarding. Hernia confirmed negative in the right inguinal area and confirmed negative in the left inguinal area.  Genitourinary: Prostate normal, testes normal and penis normal. Rectal exam shows no external hemorrhoid, no internal hemorrhoid, no fissure, no mass, no tenderness and anal tone normal. Guaiac positive stool. Prostate is not enlarged and not tender. Right testis shows no mass, no swelling and no tenderness. Right testis is descended. Left testis shows no mass, no swelling and no tenderness. Left testis is descended. Uncircumcised. No phimosis, paraphimosis, hypospadias, penile erythema or penile tenderness. No discharge found.  Musculoskeletal: Normal range of motion. He exhibits no edema and no tenderness.  Lymphadenopathy:    He has no cervical adenopathy.       Right: No inguinal adenopathy present.       Left: No inguinal adenopathy present.  Neurological: He is oriented to person, place, and time.  Skin: Skin is warm and dry. No rash noted. He is not diaphoretic. No erythema. No pallor.  Psychiatric: He has a normal mood and affect. His behavior is normal. Judgment and thought content normal.     Lab Results  Component Value Date   WBC 7.8  06/15/2011   HGB 15.6 06/15/2011   HCT 45.6 06/15/2011   PLT  247.0 06/15/2011   GLUCOSE 97 06/15/2011   CHOL 164 06/15/2011   TRIG 82.0 06/15/2011   HDL 46.70 06/15/2011   LDLCALC 101* 06/15/2011   ALT 19 06/15/2011   AST 19 06/15/2011   NA 143 06/15/2011   K 4.9 06/15/2011   CL 108 06/15/2011   CREATININE 1.0 06/15/2011   BUN 16 06/15/2011   CO2 30 06/15/2011   TSH 2.97 06/15/2011   PSA 1.15 01/12/2011   HGBA1C 6.1 06/15/2011       Assessment & Plan:

## 2012-09-05 NOTE — Patient Instructions (Signed)
Health Maintenance, Males A healthy lifestyle and preventative care can promote health and wellness.  Maintain regular health, dental, and eye exams.   Eat a healthy diet. Foods like vegetables, fruits, whole grains, low-fat dairy products, and lean protein foods contain the nutrients you need without too many calories. Decrease your intake of foods high in solid fats, added sugars, and salt. Get information about a proper diet from your caregiver, if necessary.   Regular physical exercise is one of the most important things you can do for your health. Most adults should get at least 150 minutes of moderate-intensity exercise (any activity that increases your heart rate and causes you to sweat) each week. In addition, most adults need muscle-strengthening exercises on 2 or more days a week.    Maintain a healthy weight. The body mass index (BMI) is a screening tool to identify possible weight problems. It provides an estimate of body fat based on height and weight. Your caregiver can help determine your BMI, and can help you achieve or maintain a healthy weight. For adults 20 years and older:   A BMI below 18.5 is considered underweight.   A BMI of 18.5 to 24.9 is normal.   A BMI of 25 to 29.9 is considered overweight.   A BMI of 30 and above is considered obese.   Maintain normal blood lipids and cholesterol by exercising and minimizing your intake of saturated fat. Eat a balanced diet with plenty of fruits and vegetables. Blood tests for lipids and cholesterol should begin at age 20 and be repeated every 5 years. If your lipid or cholesterol levels are high, you are over 50, or you are a high risk for heart disease, you may need your cholesterol levels checked more frequently.Ongoing high lipid and cholesterol levels should be treated with medicines, if diet and exercise are not effective.   If you smoke, find out from your caregiver how to quit. If you do not use tobacco, do not start.    If you choose to drink alcohol, do not exceed 2 drinks per day. One drink is considered to be 12 ounces (355 mL) of beer, 5 ounces (148 mL) of wine, or 1.5 ounces (44 mL) of liquor.   Avoid use of street drugs. Do not share needles with anyone. Ask for help if you need support or instructions about stopping the use of drugs.   High blood pressure causes heart disease and increases the risk of stroke. Blood pressure should be checked at least every 1 to 2 years. Ongoing high blood pressure should be treated with medicines if weight loss and exercise are not effective.   If you are 45 to 41 years old, ask your caregiver if you should take aspirin to prevent heart disease.   Diabetes screening involves taking a blood sample to check your fasting blood sugar level. This should be done once every 3 years, after age 45, if you are within normal weight and without risk factors for diabetes. Testing should be considered at a younger age or be carried out more frequently if you are overweight and have at least 1 risk factor for diabetes.   Colorectal cancer can be detected and often prevented. Most routine colorectal cancer screening begins at the age of 50 and continues through age 75. However, your caregiver may recommend screening at an earlier age if you have risk factors for colon cancer. On a yearly basis, your caregiver may provide home test kits to check for hidden   blood in the stool. Use of a small camera at the end of a tube, to directly examine the colon (sigmoidoscopy or colonoscopy), can detect the earliest forms of colorectal cancer. Talk to your caregiver about this at age 50, when routine screening begins. Direct examination of the colon should be repeated every 5 to 10 years through age 75, unless early forms of pre-cancerous polyps or small growths are found.   Hepatitis C blood testing is recommended for all people born from 1945 through 1965 and any individual with known risks for  hepatitis C.   Healthy men should no longer receive prostate-specific antigen (PSA) blood tests as part of routine cancer screening. Consult with your caregiver about prostate cancer screening.   Testicular cancer screening is not recommended for adolescents or adult males who have no symptoms. Screening includes self-exam, caregiver exam, and other screening tests. Consult with your caregiver about any symptoms you have or any concerns you have about testicular cancer.   Practice safe sex. Use condoms and avoid high-risk sexual practices to reduce the spread of sexually transmitted infections (STIs).   Use sunscreen with a sun protection factor (SPF) of 30 or greater. Apply sunscreen liberally and repeatedly throughout the day. You should seek shade when your shadow is shorter than you. Protect yourself by wearing long sleeves, pants, a wide-brimmed hat, and sunglasses year round, whenever you are outdoors.   Notify your caregiver of new moles or changes in moles, especially if there is a change in shape or color. Also notify your caregiver if a mole is larger than the size of a pencil eraser.   A one-time screening for abdominal aortic aneurysm (AAA) and surgical repair of large AAAs by sound wave imaging (ultrasonography) is recommended for ages 65 to 75 years who are current or former smokers.   Stay current with your immunizations.  Document Released: 05/27/2008 Document Revised: 11/18/2011 Document Reviewed: 04/26/2011 [employer] Patient Information 2012 [employer]. 

## 2012-09-05 NOTE — Assessment & Plan Note (Signed)
He has not been treating this, will refer him to pulm for further evaluation

## 2012-09-05 NOTE — Assessment & Plan Note (Signed)
I have asked him to see GI to see if this needs further investigation

## 2012-09-06 ENCOUNTER — Encounter: Payer: Self-pay | Admitting: Internal Medicine

## 2012-09-06 LAB — TSH: TSH: 2.7 u[IU]/mL (ref 0.35–5.50)

## 2012-09-29 ENCOUNTER — Encounter: Payer: Self-pay | Admitting: Internal Medicine

## 2012-10-03 ENCOUNTER — Encounter: Payer: Self-pay | Admitting: Internal Medicine

## 2012-10-03 ENCOUNTER — Ambulatory Visit (INDEPENDENT_AMBULATORY_CARE_PROVIDER_SITE_OTHER): Payer: PRIVATE HEALTH INSURANCE | Admitting: Internal Medicine

## 2012-10-03 VITALS — BP 130/80 | HR 70 | Ht 69.0 in | Wt 369.0 lb

## 2012-10-03 DIAGNOSIS — R195 Other fecal abnormalities: Secondary | ICD-10-CM

## 2012-10-03 DIAGNOSIS — L290 Pruritus ani: Secondary | ICD-10-CM

## 2012-10-03 MED ORDER — PEG-KCL-NACL-NASULF-NA ASC-C 100 G PO SOLR: 1.0000 | Freq: Once | ORAL | Status: DC

## 2012-10-03 MED ORDER — ZINC OXIDE 11.3 % EX CREA: 1.0000 "application " | TOPICAL_CREAM | Freq: Two times a day (BID) | CUTANEOUS | Status: DC

## 2012-10-03 NOTE — Patient Instructions (Addendum)
You have been scheduled for a colonoscopy with propofol. Please follow written instructions given to you at your visit today.  Please pick up your prep kit at the pharmacy within the next 1-3 days. If you use inhalers (even only as needed), please bring them with you on the day of your procedure.  We have sent the following medications to your pharmacy for you to pick up at your convenience: Moviprep, you were given instructions at your visit today. Balmex, you can get the over the counter, please take as directed.

## 2012-10-03 NOTE — Progress Notes (Signed)
Patient ID: Joseph Wood, male   DOB: [birth year], 41 y.o.   MRN: [id number]  SUBJECTIVE: HPI Mr. Spratling is a 41 year old man with a past medical history of hypertension and obesity who seen in consultation at the request of Dr. Ronnald Ramp for heme positive stool found on DRE.  The patient reports that he feels well with no specific complaint. He reports normal bowel habits for him without diarrhea or constipation. He denies abdominal pain. Appetite is good. Weight is stable. No nausea or vomiting. No heartburn. No trouble swallowing. He does occasionally feel perianal irritation after a bowel movement and very rarely see scant blood per rectum on the tissue only. No melena. No previous history of colonoscopy. His father did have colorectal cancer in his early 69s but is doing well now.  Review of Systems  As per history of present illness, otherwise negative   Past Medical History  Diagnosis Date  . Hypertension   . Obesity     Current Outpatient Prescriptions  Medication Sig Dispense Refill  . benazepril (LOTENSIN) 20 MG tablet Take 1 tablet (20 mg total) by mouth daily.  90 tablet  1  . naproxen (NAPROSYN) 500 MG tablet Take 1 tablet (500 mg total) by mouth 2 (two) times daily with a meal.  60 tablet  2  . peg 3350 powder (MOVIPREP) 100 G SOLR Take 1 kit (100 g total) by mouth once.  1 kit  0  . zinc oxide (BALMEX) 11.3 % CREA cream Apply 1 application topically 2 (two) times daily.        No Known Allergies  Family History  Problem Relation Age of Onset  . Arthritis Mother   . Hypertension Mother   . Breast cancer Mother   . Aneurysm Father     aortic  . Prostate cancer Father   . Hypertension Father   . Diabetes Mother   . Diabetes Father     History  Substance Use Topics  . Smoking status: Never Smoker   . Smokeless tobacco: Never Used  . Alcohol Use: No    OBJECTIVE: BP 130/80  Pulse 70  Ht 5' 9"  (1.753 m)  Wt 369 lb (167.377 kg)  BMI 54.49  kg/m2 Constitutional: Well-developed and well-nourished. No distress. HEENT: Normocephalic and atraumatic. Oropharynx is clear and moist. No oropharyngeal exudate. Conjunctivae are normal.  No scleral icterus. Neck: Neck supple. Trachea midline. Cardiovascular: Normal rate, regular rhythm and intact distal pulses. No M/R/G Pulmonary/chest: Effort normal and breath sounds normal. No wheezing, rales or rhonchi. Abdominal: Soft, obese which limits this abdominal exam, nontender. Bowel sounds active throughout.  Extremities: no clubbing, cyanosis, or edema Lymphadenopathy: No cervical adenopathy noted. Neurological: Alert and oriented to person place and time. Skin: Skin is warm and dry. No rashes noted. Psychiatric: Normal mood and affect. Behavior is normal.  Labs  CBC    Component Value Date/Time   WBC 7.9 09/05/2012 0831   RBC 5.12 09/05/2012 0831   HGB 15.3 09/05/2012 0831   HCT 46.4 09/05/2012 0831   PLT 256.0 09/05/2012 0831   MCV 90.5 09/05/2012 0831   MCHC 33.1 09/05/2012 0831   RDW 14.1 09/05/2012 0831   LYMPHSABS 1.9 09/05/2012 0831   MONOABS 0.7 09/05/2012 0831   EOSABS 0.2 09/05/2012 0831   BASOSABS 0.0 09/05/2012 0831    CMP     Component Value Date/Time   NA 143 09/05/2012 0831   K 4.5 09/05/2012 0831   CL 100 09/05/2012 0831   CO2  30 09/05/2012 0831   GLUCOSE 98 09/05/2012 0831   BUN 17 09/05/2012 0831   CREATININE 1.1 09/05/2012 0831   CALCIUM 9.8 09/05/2012 0831   PROT 7.1 09/05/2012 0831   ALBUMIN 4.0 09/05/2012 0831   AST 24 09/05/2012 0831   ALT 26 09/05/2012 0831   ALKPHOS 49 09/05/2012 0831   BILITOT 1.0 09/05/2012 0831   GFRNONAA >60 11/24/2009 0721   GFRAA  Value: >60        The eGFR has been calculated using the MDRD equation. This calculation has not been validated in all clinical situations. eGFR's persistently <60 mL/min signify possible Chronic Kidney Disease. 11/24/2009 0721    ASSESSMENT AND PLAN: 41 year old man with a past medical history of hypertension and  obesity who seen in consultation at the request of Dr. Ronnald Ramp for heme positive stool found on DRE.    1.  Hemoccult-positive stool -- the patient does have periodic perianal irritation, but no hemorrhoidal disease or fissure disease was seen on Dr. Ronnald Ramp' rectal exam.  Given the heme positive stool, I recommended colonoscopy. We discussed the test today including the risks and benefits and he is agreeable to proceed. This will need to be performed in hospital setting as an outpatient due to his BMI.  I have recommended twice daily Balmex cream for perianal irritation.  Further recommendations to be made after colonoscopy.

## 2012-11-02 NOTE — Progress Notes (Signed)
11-02-12 1230 Instructed. Teach back method used.W. Floy Sabina

## 2012-11-06 ENCOUNTER — Ambulatory Visit (HOSPITAL_COMMUNITY)
Admission: RE | Admit: 2012-11-06 | Discharge: 2012-11-06 | Disposition: A | Discharge status: Home / Self Care | Payer: PRIVATE HEALTH INSURANCE | Source: Ambulatory Visit | Attending: Internal Medicine | Admitting: Internal Medicine

## 2012-11-06 ENCOUNTER — Ambulatory Visit (HOSPITAL_COMMUNITY): Payer: PRIVATE HEALTH INSURANCE | Admitting: Anesthesiology

## 2012-11-06 ENCOUNTER — Encounter (HOSPITAL_COMMUNITY): Payer: Self-pay

## 2012-11-06 ENCOUNTER — Encounter (HOSPITAL_COMMUNITY)
Admission: RE | Disposition: A | Discharge status: Home / Self Care | Payer: Self-pay | Source: Ambulatory Visit | Attending: Internal Medicine

## 2012-11-06 ENCOUNTER — Encounter (HOSPITAL_COMMUNITY): Payer: Self-pay | Admitting: Anesthesiology

## 2012-11-06 ENCOUNTER — Other Ambulatory Visit: Payer: Self-pay | Admitting: Gastroenterology

## 2012-11-06 ENCOUNTER — Encounter (HOSPITAL_COMMUNITY): Payer: Self-pay | Admitting: Internal Medicine

## 2012-11-06 DIAGNOSIS — Z8042 Family history of malignant neoplasm of prostate: Secondary | ICD-10-CM

## 2012-11-06 DIAGNOSIS — G4730 Sleep apnea, unspecified: Secondary | ICD-10-CM | POA: Insufficient documentation

## 2012-11-06 DIAGNOSIS — E669 Obesity, unspecified: Secondary | ICD-10-CM | POA: Insufficient documentation

## 2012-11-06 DIAGNOSIS — K635 Polyp of colon: Secondary | ICD-10-CM

## 2012-11-06 DIAGNOSIS — Z125 Encounter for screening for malignant neoplasm of prostate: Secondary | ICD-10-CM

## 2012-11-06 DIAGNOSIS — K921 Melena: Secondary | ICD-10-CM

## 2012-11-06 DIAGNOSIS — I10 Essential (primary) hypertension: Secondary | ICD-10-CM | POA: Insufficient documentation

## 2012-11-06 DIAGNOSIS — K648 Other hemorrhoids: Secondary | ICD-10-CM | POA: Insufficient documentation

## 2012-11-06 DIAGNOSIS — K5730 Diverticulosis of large intestine without perforation or abscess without bleeding: Secondary | ICD-10-CM | POA: Insufficient documentation

## 2012-11-06 DIAGNOSIS — D126 Benign neoplasm of colon, unspecified: Secondary | ICD-10-CM | POA: Insufficient documentation

## 2012-11-06 HISTORY — PX: COLONOSCOPY WITH PROPOFOL: SHX5780

## 2012-11-06 LAB — BASIC METABOLIC PANEL
CO2: 25 mEq/L (ref 19–32)
Glucose, Bld: 91 mg/dL (ref 70–99)
Potassium: 3.8 mEq/L (ref 3.5–5.1)
Sodium: 140 mEq/L (ref 135–145)

## 2012-11-06 SURGERY — COLONOSCOPY
Anesthesia: Moderate Sedation

## 2012-11-06 SURGERY — COLONOSCOPY WITH PROPOFOL
Anesthesia: Monitor Anesthesia Care

## 2012-11-06 MED ORDER — PROPOFOL 10 MG/ML IV EMUL
INTRAVENOUS | Status: DC | PRN
  Administered 2012-11-06: 70 ug/kg/min via INTRAVENOUS

## 2012-11-06 MED ORDER — SODIUM CHLORIDE 0.9 % IV SOLN
INTRAVENOUS | Status: DC
Start: 2012-11-06 — End: 2012-11-06

## 2012-11-06 MED ORDER — LACTATED RINGERS IV SOLN
INTRAVENOUS | Status: DC | PRN
  Administered 2012-11-06: 11:00:00 via INTRAVENOUS

## 2012-11-06 MED ORDER — FENTANYL CITRATE 0.05 MG/ML IJ SOLN
INTRAMUSCULAR | Status: DC | PRN
  Administered 2012-11-06 (×2): 50 ug via INTRAVENOUS

## 2012-11-06 MED ORDER — KETAMINE HCL 50 MG/ML IJ SOLN
INTRAMUSCULAR | Status: DC | PRN
  Administered 2012-11-06: 25 mg via INTRAMUSCULAR

## 2012-11-06 MED ORDER — LACTATED RINGERS IV SOLN
INTRAVENOUS | Status: DC
  Administered 2012-11-06: 11:00:00 via INTRAVENOUS

## 2012-11-06 MED ORDER — MIDAZOLAM HCL 5 MG/5ML IJ SOLN
INTRAMUSCULAR | Status: DC | PRN
  Administered 2012-11-06: 2 mg via INTRAVENOUS

## 2012-11-06 SURGICAL SUPPLY — 22 items

## 2012-11-06 NOTE — Anesthesia Postprocedure Evaluation (Signed)
  Anesthesia Post-op Note  Patient: Joseph Wood  Procedure(s) Performed: Procedure(s) (LRB): COLONOSCOPY WITH PROPOFOL (N/A)  Patient Location: PACU  Anesthesia Type: MAC  Level of Consciousness: awake and alert   Airway and Oxygen Therapy: Patient Spontanous Breathing  Post-op Pain: mild  Post-op Assessment: Post-op Vital signs reviewed, Patient's Cardiovascular Status Stable, Respiratory Function Stable, Patent Airway and No signs of Nausea or vomiting  Last Vitals:  Filed Vitals:   11/06/12 1230  BP: 141/80  Pulse:   Temp:   Resp: 7    Post-op Vital Signs: stable   Complications: No apparent anesthesia complications

## 2012-11-06 NOTE — Transfer of Care (Signed)
Immediate Anesthesia Transfer of Care Note  Patient: Joseph Wood  Procedure(s) Performed: Procedure(s) (LRB) with comments: COLONOSCOPY WITH PROPOFOL (N/A) - Andrea/  Patient Location: PACU  Anesthesia Type:MAC  Level of Consciousness: sedated  Airway & Oxygen Therapy: Patient Spontanous Breathing and Patient connected to face mask oxygen  Post-op Assessment: Report given to PACU RN and Post -op Vital signs reviewed and stable  Post vital signs: Reviewed and stable  Complications: No apparent anesthesia complications

## 2012-11-06 NOTE — H&P (Signed)
   SUBJECTIVE: HPI Joseph Wood is a 41 year old man with a past medical history of hypertension and obesity who presents today for colonoscopy after being seen in the office in October 2013. He was initially seen for heme-positive stool found on DRE. He was and remains without specific complaint. No abdominal pain, nausea, vomiting, diarrhea or constipation. The recent rectal bleeding or melena.   Review of Systems  As per history of present illness, otherwise negative   Past Medical History  Diagnosis Date  . Hypertension   . Obesity     No current facility-administered medications for this encounter.    No Known Allergies  Family History  Problem Relation Age of Onset  . Arthritis Mother   . Hypertension Mother   . Breast cancer Mother   . Aneurysm Father     aortic  . Prostate cancer Father   . Hypertension Father   . Diabetes Mother   . Diabetes Father     History  Substance Use Topics  . Smoking status: Never Smoker   . Smokeless tobacco: Never Used  . Alcohol Use: No    OBJECTIVE: BP 154/92  Pulse 81  Temp 98.5 F (36.9 C) (Oral)  Resp 19  Ht 5' 9"  (1.753 m)  Wt 365 lb (165.563 kg)  BMI 53.90 kg/m2  SpO2 97% Constitutional: Well-developed and well-nourished. No distress.  HEENT: Normocephalic and atraumatic.  Cardiovascular: Normal rate, regular rhythm and intact distal pulses. No M/R/G  Pulmonary/chest: Effort normal and breath sounds normal. No wheezing, rales or rhonchi.  Abdominal: Soft, obese, nontender. Bowel sounds active throughout.  Extremities: no clubbing, cyanosis, or edema  Neurological: Alert and oriented to person place and time.  Skin: Skin is warm and dry. No rashes noted.  Psychiatric: Normal mood and affect. Behavior is normal.   Labs  CBC    Component Value Date/Time   WBC 7.9 09/05/2012 0831   RBC 5.12 09/05/2012 0831   HGB 15.3 09/05/2012 0831   HCT 46.4 09/05/2012 0831   PLT 256.0 09/05/2012 0831   MCV 90.5 09/05/2012 0831     MCHC 33.1 09/05/2012 0831   RDW 14.1 09/05/2012 0831   LYMPHSABS 1.9 09/05/2012 0831   MONOABS 0.7 09/05/2012 0831   EOSABS 0.2 09/05/2012 0831   BASOSABS 0.0 09/05/2012 0831      ASSESSMENT AND PLAN: 41 year old man with a past medical history of hypertension and obesity who presents today for colonoscopy after being seen in the office in October 2013.  1.  + FOBT -- patient with positive FOBT found during general health exam. We discussed colonoscopy, including the risks and benefits and he is agreeable to proceed. Further recommendations to be made thereafter.  2.  prostate cancer screening -- patient states that his father had prostate cancer, and also an uncle. He would like to find out more about this and has asked for urology referral. We'll place referral for him to be seen by urology

## 2012-11-06 NOTE — Anesthesia Preprocedure Evaluation (Addendum)
Anesthesia Evaluation  Patient identified by MRN, date of birth, ID band Patient awake    Reviewed: Allergy & Precautions, H&P , NPO status , Patient's Chart, lab work & pertinent test results, reviewed documented beta blocker date and time   Airway Mallampati: II TM Distance: >3 FB Neck ROM: full    Dental No notable dental hx. (+) Partial Upper   Pulmonary sleep apnea ,  breath sounds clear to auscultation  Pulmonary exam normal       Cardiovascular Exercise Tolerance: Good hypertension, On Medications Rhythm:regular Rate:Normal     Neuro/Psych negative neurological ROS  negative psych ROS   GI/Hepatic negative GI ROS, Neg liver ROS,   Endo/Other  Morbid obesity  Renal/GU negative Renal ROS  negative genitourinary   Musculoskeletal   Abdominal (+) + obese,   Peds  Hematology negative hematology ROS (+)   Anesthesia Other Findings   Reproductive/Obstetrics negative OB ROS                          Anesthesia Physical Anesthesia Plan  ASA: III  Anesthesia Plan: MAC   Post-op Pain Management:    Induction: Intravenous  Airway Management Planned:   Additional Equipment:   Intra-op Plan:   Post-operative Plan:   Informed Consent: I have reviewed the patients History and Physical, chart, labs and discussed the procedure including the risks, benefits and alternatives for the proposed anesthesia with the patient or authorized representative who has indicated his/her understanding and acceptance.   Dental Advisory Given  Plan Discussed with: CRNA  Anesthesia Plan Comments:         Anesthesia Quick Evaluation

## 2012-11-06 NOTE — Op Note (Signed)
[facility] [place], [address]   COLONOSCOPY PROCEDURE REPORT  PATIENT: Joseph, Wood.  MR#: [id number] BIRTHDATE: 11-28-1971 , 41  yrs. old GENDER: Male ENDOSCOPIST: Jerene Bears, MD REFERRED MH:[id number], [place] PROCEDURE DATE:  11/06/2012 PROCEDURE:   Colonoscopy with snare polypectomy ASA CLASS:   Class III INDICATIONS:heme-positive stool. MEDICATIONS: MAC sedation, administered by CRNA and See Anesthesia Report.  DESCRIPTION OF PROCEDURE:   After the risks benefits and alternatives of the procedure were thoroughly explained, informed consent was obtained.  A digital rectal exam revealed no rectal mass.   The     endoscope was introduced through the anus and advanced to the cecum, which was identified by both the appendix and ileocecal valve. No adverse events experienced.   The quality of the prep was good, using MoviPrep  The instrument was then slowly withdrawn as the colon was fully examined.    COLON FINDINGS: A sessile polyp measuring 5 mm in size was found in the transverse colon.  A polypectomy was performed with a cold snare.  The resection was complete and the polyp tissue was completely retrieved.   Mild diverticulosis was noted in the sigmoid colon.  Retroflexed views revealed small internal hemorrhoids.         The scope was withdrawn and the procedure completed.  COMPLICATIONS: There were no complications.  ENDOSCOPIC IMPRESSION: 1.   Sessile polyp measuring 5 mm in size was found in the transverse colon; polypectomy was performed with a cold snare 2.   Mild diverticulosis was noted in the sigmoid colon 3.   Small internal hemorrhoids  RECOMMENDATIONS: 1.  Await pathology results 2.  High fiber diet 3.  Timing of repeat colonoscopy will be determined by pathology findings. 4.  You will receive a letter within 1-2 weeks with the results of your biopsy as well as final recommendations.  Please call my office if  you have not received a letter after 3 weeks.   eSigned:  Jerene Bears, MD 11/06/2012 12:05 PM   cc: Janith Lima, MD and The Patient

## 2012-11-07 ENCOUNTER — Ambulatory Visit (HOSPITAL_COMMUNITY): Admit: 2012-11-07 | Payer: Self-pay | Admitting: Internal Medicine

## 2012-11-07 ENCOUNTER — Encounter (HOSPITAL_COMMUNITY): Payer: Self-pay | Admitting: Internal Medicine

## 2012-11-08 ENCOUNTER — Encounter: Payer: Self-pay | Admitting: Internal Medicine

## 2012-11-14 ENCOUNTER — Telehealth: Payer: Self-pay | Admitting: Internal Medicine

## 2012-11-14 NOTE — Telephone Encounter (Signed)
VM not set up on cell phone. Called home phone and left a message with his wife to have him call me back.

## 2012-11-17 NOTE — Telephone Encounter (Signed)
Pt reports he received his path letter and we discussed the results, type of polyp and the recall; pt stated understanding.

## 2013-06-06 ENCOUNTER — Other Ambulatory Visit: Payer: Self-pay | Admitting: Internal Medicine

## 2015-02-05 ENCOUNTER — Emergency Department (HOSPITAL_COMMUNITY)
Admission: EM | Admit: 2015-02-05 | Discharge: 2015-02-05 | Disposition: A | Discharge status: Home / Self Care | Payer: BLUE CROSS/BLUE SHIELD | Attending: Emergency Medicine | Admitting: Emergency Medicine

## 2015-02-05 ENCOUNTER — Encounter (HOSPITAL_COMMUNITY): Payer: Self-pay | Admitting: Emergency Medicine

## 2015-02-05 DIAGNOSIS — E1165 Type 2 diabetes mellitus with hyperglycemia: Secondary | ICD-10-CM | POA: Insufficient documentation

## 2015-02-05 DIAGNOSIS — I10 Essential (primary) hypertension: Secondary | ICD-10-CM | POA: Insufficient documentation

## 2015-02-05 DIAGNOSIS — E669 Obesity, unspecified: Secondary | ICD-10-CM | POA: Insufficient documentation

## 2015-02-05 DIAGNOSIS — Z8669 Personal history of other diseases of the nervous system and sense organs: Secondary | ICD-10-CM | POA: Diagnosis not present

## 2015-02-05 DIAGNOSIS — Z7982 Long term (current) use of aspirin: Secondary | ICD-10-CM | POA: Insufficient documentation

## 2015-02-05 DIAGNOSIS — R739 Hyperglycemia, unspecified: Secondary | ICD-10-CM | POA: Diagnosis present

## 2015-02-05 DIAGNOSIS — R358 Other polyuria: Secondary | ICD-10-CM | POA: Diagnosis not present

## 2015-02-05 DIAGNOSIS — E119 Type 2 diabetes mellitus without complications: Secondary | ICD-10-CM

## 2015-02-05 LAB — URINALYSIS, ROUTINE W REFLEX MICROSCOPIC
Bilirubin Urine: NEGATIVE
Glucose, UA: 250 mg/dL — AB
HGB URINE DIPSTICK: NEGATIVE
Ketones, ur: NEGATIVE mg/dL
Leukocytes, UA: NEGATIVE
Nitrite: NEGATIVE
Protein, ur: NEGATIVE mg/dL
SPECIFIC GRAVITY, URINE: 1.026 (ref 1.005–1.030)
Urobilinogen, UA: 0.2 mg/dL (ref 0.0–1.0)
pH: 5 (ref 5.0–8.0)

## 2015-02-05 LAB — CBG MONITORING, ED
GLUCOSE-CAPILLARY: 197 mg/dL — AB (ref 70–99)
Glucose-Capillary: 268 mg/dL — ABNORMAL HIGH (ref 70–99)

## 2015-02-05 LAB — CBC WITH DIFFERENTIAL/PLATELET
BASOS ABS: 0 10*3/uL (ref 0.0–0.1)
BASOS PCT: 0 % (ref 0–1)
EOS PCT: 2 % (ref 0–5)
Eosinophils Absolute: 0.2 10*3/uL (ref 0.0–0.7)
HCT: 48.2 % (ref 39.0–52.0)
Hemoglobin: 16 g/dL (ref 13.0–17.0)
LYMPHS ABS: 3 10*3/uL (ref 0.7–4.0)
LYMPHS PCT: 28 % (ref 12–46)
MCH: 29.4 pg (ref 26.0–34.0)
MCHC: 33.2 g/dL (ref 30.0–36.0)
MCV: 88.6 fL (ref 78.0–100.0)
Monocytes Absolute: 0.7 10*3/uL (ref 0.1–1.0)
Monocytes Relative: 6 % (ref 3–12)
NEUTROS ABS: 6.7 10*3/uL (ref 1.7–7.7)
Neutrophils Relative %: 64 % (ref 43–77)
PLATELETS: 268 10*3/uL (ref 150–400)
RBC: 5.44 MIL/uL (ref 4.22–5.81)
RDW: 13.6 % (ref 11.5–15.5)
WBC: 10.6 10*3/uL — AB (ref 4.0–10.5)

## 2015-02-05 LAB — COMPREHENSIVE METABOLIC PANEL
ALT: 33 U/L (ref 0–53)
AST: 20 U/L (ref 0–37)
Albumin: 4 g/dL (ref 3.5–5.2)
Alkaline Phosphatase: 77 U/L (ref 39–117)
Anion gap: 6 (ref 5–15)
BUN: 21 mg/dL (ref 6–23)
CALCIUM: 9.1 mg/dL (ref 8.4–10.5)
CO2: 27 mmol/L (ref 19–32)
CREATININE: 1.05 mg/dL (ref 0.50–1.35)
Chloride: 103 mmol/L (ref 96–112)
GFR calc Af Amer: >90 mL/min (ref >90)
GFR, EST NON AFRICAN AMERICAN: 85 mL/min — AB (ref >90)
GLUCOSE: 239 mg/dL — AB (ref 70–99)
Potassium: 3.9 mmol/L (ref 3.5–5.1)
Sodium: 136 mmol/L (ref 135–145)
Total Bilirubin: 0.8 mg/dL (ref 0.3–1.2)
Total Protein: 7.8 g/dL (ref 6.0–8.3)

## 2015-02-05 MED ORDER — SODIUM CHLORIDE 0.9 % IV BOLUS (SEPSIS)
1000.0000 mL | Freq: Once | INTRAVENOUS | Status: AC
  Administered 2015-02-05: 1000 mL via INTRAVENOUS

## 2015-02-05 MED ORDER — METFORMIN HCL 500 MG PO TABS: 500.0000 mg | ORAL_TABLET | Freq: Two times a day (BID) | ORAL | Status: DC

## 2015-02-05 NOTE — ED Notes (Signed)
Pt states he has never been diagnosed with diabetes but checked CBG today and it was in the 300s, pt  Has family hx of DM. Pt also c/o nausea, thirsty, and urinary frequency

## 2015-02-05 NOTE — ED Provider Notes (Addendum)
CSN: [id number]     Arrival date & time 02/05/15  1836 History   First MD Initiated Contact with Patient 02/05/15 2110     Chief Complaint  Patient presents with  . Nausea  . Hyperglycemia     (Consider location/radiation/quality/duration/timing/severity/associated sxs/prior Treatment) Patient is a 44 y.o. male presenting with hyperglycemia. The history is provided by the patient.  Hyperglycemia Blood sugar level PTA:  380 Severity:  Severe Onset quality:  Unable to specify Timing:  Constant Progression:  Worsening Chronicity:  New Diabetes status:  Non-diabetic Context: not recent change in diet   Relieved by:  Nothing Ineffective treatments:  None tried Associated symptoms: fatigue, increased thirst, nausea, polyuria and weakness   Associated symptoms: no abdominal pain, no altered mental status, no blurred vision, no chest pain, no confusion, no shortness of breath and no vomiting   Risk factors: family hx of diabetes and obesity   Risk factors: no pancreatic disease and no recent steroid use     Past Medical History  Diagnosis Date  . Hypertension   . Obesity   . Sleep apnea    Past Surgical History  Procedure Laterality Date  . Acl repair      Right  . Colonoscopy with propofol  11/06/2012    Procedure: COLONOSCOPY WITH PROPOFOL;  Surgeon: Jerene Bears, MD;  Location: [facility];  Service: Gastroenterology;  Laterality: N/A;  Andrea/   Family History  Problem Relation Age of Onset  . Arthritis Mother   . Hypertension Mother   . Breast cancer Mother   . Aneurysm Father     aortic  . Prostate cancer Father   . Hypertension Father   . Diabetes Mother   . Diabetes Father    History  Substance Use Topics  . Smoking status: Never Smoker   . Smokeless tobacco: Never Used  . Alcohol Use: No    Review of Systems  Constitutional: Positive for fatigue.  Eyes: Negative for blurred vision.  Respiratory: Negative for shortness of breath.   Cardiovascular:  Negative for chest pain.  Gastrointestinal: Positive for nausea. Negative for vomiting and abdominal pain.  Endocrine: Positive for polydipsia and polyuria.  Neurological: Positive for weakness.  Psychiatric/Behavioral: Negative for confusion.  All other systems reviewed and are negative.     Allergies  Review of patient's allergies indicates no known allergies.  Home Medications   Prior to Admission medications   Medication Sig Start Date End Date Taking? Authorizing Provider  ibuprofen (ADVIL,MOTRIN) 200 MG tablet Take 200 mg by mouth every 6 (six) hours as needed for mild pain.   Yes Historical Provider, MD  OMEPRAZOLE PO Take 1 tablet by mouth daily as needed (reflux.).   Yes Historical Provider, MD  aspirin EC 81 MG tablet Take 81 mg by mouth daily.    Historical Provider, MD  benazepril (LOTENSIN) 20 MG tablet TAKE ONE TABLET BY MOUTH EVERY DAY Patient not taking: Reported on 02/05/2015 06/06/13   Janith Lima, MD  peg 3350 powder (MOVIPREP) 100 G SOLR Take 1 kit (100 g total) by mouth once. Patient not taking: Reported on 02/05/2015 10/03/12   Jerene Bears, MD  zinc oxide (BALMEX) 11.3 % CREA cream Apply 1 application topically 2 (two) times daily. Patient not taking: Reported on 02/05/2015 10/03/12   Jerene Bears, MD   BP 122/80 mmHg  Pulse 82  Temp(Src) 98.3 F (36.8 C) (Oral)  Resp 16  SpO2 97% Physical Exam  Constitutional: He is oriented  to person, place, and time. He appears well-developed and well-nourished. No distress.  obese  HENT:  Head: Normocephalic and atraumatic.  Mouth/Throat: Oropharynx is clear and moist. Mucous membranes are dry.  Eyes: Conjunctivae and EOM are normal. Pupils are equal, round, and reactive to light.  Neck: Normal range of motion. Neck supple.  Cardiovascular: Normal rate, regular rhythm and intact distal pulses.   No murmur heard. Pulmonary/Chest: Effort normal and breath sounds normal. No respiratory distress. He has no wheezes. He  has no rales.  Abdominal: Soft. He exhibits no distension. There is no tenderness. There is no rebound and no guarding.  Musculoskeletal: Normal range of motion. He exhibits no edema or tenderness.  Neurological: He is alert and oriented to person, place, and time.  Skin: Skin is warm and dry. No rash noted. No erythema.  Psychiatric: He has a normal mood and affect. His behavior is normal.  Nursing note and vitals reviewed.   ED Course  Procedures (including critical care time) Labs Review Labs Reviewed  CBC WITH DIFFERENTIAL/PLATELET - Abnormal; Notable for the following:    WBC 10.6 (*)    All other components within normal limits  URINALYSIS, ROUTINE W REFLEX MICROSCOPIC - Abnormal; Notable for the following:    APPearance CLOUDY (*)    Glucose, UA 250 (*)    All other components within normal limits  COMPREHENSIVE METABOLIC PANEL - Abnormal; Notable for the following:    Glucose, Bld 239 (*)    GFR calc non Af Amer 85 (*)    All other components within normal limits  CBG MONITORING, ED - Abnormal; Notable for the following:    Glucose-Capillary 268 (*)    All other components within normal limits    Imaging Review No results found.   EKG Interpretation None      MDM   Final diagnoses:  Type 2 diabetes mellitus without complication    Patient here with new onset diabetes. Otherwise vital signs are stable and no evidence of DKA. Patient given IV fluids and started on metformin    Blanchie Dessert, MD 02/05/15 2209  Blanchie Dessert, MD 02/05/15 2210

## 2015-02-05 NOTE — Discharge Instructions (Signed)
Diabetes and Exercise Exercising regularly is important. It is not just about losing weight. It has many health benefits, such as:  Improving your overall fitness, flexibility, and endurance.  Increasing your bone density.  Helping with weight control.  Decreasing your body fat.  Increasing your muscle strength.  Reducing stress and tension.  Improving your overall health. People with diabetes who exercise gain additional benefits because exercise:  Reduces appetite.  Improves the body's use of blood sugar (glucose).  Helps lower or control blood glucose.  Decreases blood pressure.  Helps control blood lipids (such as cholesterol and triglycerides).  Improves the body's use of the hormone insulin by:  Increasing the body's insulin sensitivity.  Reducing the body's insulin needs.  Decreases the risk for heart disease because exercising:  Lowers cholesterol and triglycerides levels.  Increases the levels of good cholesterol (such as high-density lipoproteins [HDL]) in the body.  Lowers blood glucose levels. YOUR ACTIVITY PLAN  Choose an activity that you enjoy and set realistic goals. Your health care provider or diabetes educator can help you make an activity plan that works for you. Exercise regularly as directed by your health care provider. This includes:  Performing resistance training twice a week such as push-ups, sit-ups, lifting weights, or using resistance bands.  Performing 150 minutes of cardio exercises each week such as walking, running, or playing sports.  Staying active and spending no more than 90 minutes at one time being inactive. Even short bursts of exercise are good for you. Three 10-minute sessions spread throughout the day are just as beneficial as a single 30-minute session. Some exercise ideas include:  Taking the dog for a walk.  Taking the stairs instead of the elevator.  Dancing to your favorite song.  Doing an exercise  video.  Doing your favorite exercise with a friend. RECOMMENDATIONS FOR EXERCISING WITH TYPE 1 OR TYPE 2 DIABETES   Check your blood glucose before exercising. If blood glucose levels are greater than 240 mg/dL, check for urine ketones. Do not exercise if ketones are present.  Avoid injecting insulin into areas of the body that are going to be exercised. For example, avoid injecting insulin into:  The arms when playing tennis.  The legs when jogging.  Keep a record of:  Food intake before and after you exercise.  Expected peak times of insulin action.  Blood glucose levels before and after you exercise.  The type and amount of exercise you have done.  Review your records with your health care provider. Your health care provider will help you to develop guidelines for adjusting food intake and insulin amounts before and after exercising.  If you take insulin or oral hypoglycemic agents, watch for signs and symptoms of hypoglycemia. They include:  Dizziness.  Shaking.  Sweating.  Chills.  Confusion.  Drink plenty of water while you exercise to prevent dehydration or heat stroke. Body water is lost during exercise and must be replaced.  Talk to your health care provider before starting an exercise program to make sure it is safe for you. Remember, almost any type of activity is better than none. Document Released: 02/19/2004 Document Revised: 04/15/2014 Document Reviewed: 05/08/2013 ExitCare Patient Information 2015 [employer]. This information is not intended to replace advice given to you by your health care provider. Make sure you discuss any questions you have with your health care provider.  

## 2015-02-06 ENCOUNTER — Telehealth: Payer: Self-pay | Admitting: Internal Medicine

## 2015-02-06 NOTE — Telephone Encounter (Signed)
yes

## 2015-02-06 NOTE — Telephone Encounter (Signed)
Would like to transfer from [place] to [place].  Please advise.

## 2015-02-06 NOTE — Telephone Encounter (Signed)
Would appear patient has not been here in some time and would need to actually show up to a visit before I would accept him.

## 2015-02-12 ENCOUNTER — Encounter: Payer: Self-pay | Admitting: Internal Medicine

## 2015-02-12 ENCOUNTER — Other Ambulatory Visit (INDEPENDENT_AMBULATORY_CARE_PROVIDER_SITE_OTHER): Payer: BLUE CROSS/BLUE SHIELD

## 2015-02-12 ENCOUNTER — Ambulatory Visit (INDEPENDENT_AMBULATORY_CARE_PROVIDER_SITE_OTHER): Payer: BLUE CROSS/BLUE SHIELD | Admitting: Internal Medicine

## 2015-02-12 VITALS — BP 142/90 | HR 93 | Temp 98.7°F | Resp 18 | Wt >= 6400 oz

## 2015-02-12 DIAGNOSIS — R739 Hyperglycemia, unspecified: Secondary | ICD-10-CM

## 2015-02-12 DIAGNOSIS — E118 Type 2 diabetes mellitus with unspecified complications: Secondary | ICD-10-CM

## 2015-02-12 DIAGNOSIS — E785 Hyperlipidemia, unspecified: Secondary | ICD-10-CM

## 2015-02-12 DIAGNOSIS — I10 Essential (primary) hypertension: Secondary | ICD-10-CM

## 2015-02-12 DIAGNOSIS — Z23 Encounter for immunization: Secondary | ICD-10-CM

## 2015-02-12 DIAGNOSIS — G4730 Sleep apnea, unspecified: Secondary | ICD-10-CM

## 2015-02-12 LAB — URINALYSIS, ROUTINE W REFLEX MICROSCOPIC
Bilirubin Urine: NEGATIVE
KETONES UR: NEGATIVE
LEUKOCYTES UA: NEGATIVE
Nitrite: NEGATIVE
Total Protein, Urine: NEGATIVE
URINE GLUCOSE: NEGATIVE
UROBILINOGEN UA: 0.2 (ref 0.0–1.0)
WBC, UA: NONE SEEN (ref 0–?)
pH: 5.5 (ref 5.0–8.0)

## 2015-02-12 LAB — COMPREHENSIVE METABOLIC PANEL
ALBUMIN: 4.1 g/dL (ref 3.5–5.2)
ALK PHOS: 70 U/L (ref 39–117)
ALT: 30 U/L (ref 0–53)
AST: 24 U/L (ref 0–37)
BILIRUBIN TOTAL: 0.4 mg/dL (ref 0.2–1.2)
BUN: 18 mg/dL (ref 6–23)
CO2: 28 mEq/L (ref 19–32)
Calcium: 9.6 mg/dL (ref 8.4–10.5)
Chloride: 105 mEq/L (ref 96–112)
Creatinine, Ser: 1.08 mg/dL (ref 0.40–1.50)
GFR: 95.68 mL/min (ref >60.00)
Glucose, Bld: 171 mg/dL — ABNORMAL HIGH (ref 70–99)
Potassium: 4.1 mEq/L (ref 3.5–5.1)
SODIUM: 139 meq/L (ref 135–145)
TOTAL PROTEIN: 7.4 g/dL (ref 6.0–8.3)

## 2015-02-12 LAB — LIPID PANEL
CHOL/HDL RATIO: 4
Cholesterol: 166 mg/dL (ref 0–200)
HDL: 44.8 mg/dL (ref >39.00)
LDL Cholesterol: 94 mg/dL (ref 0–99)
NONHDL: 121.2
TRIGLYCERIDES: 135 mg/dL (ref 0.0–149.0)
VLDL: 27 mg/dL (ref 0.0–40.0)

## 2015-02-12 LAB — CBC WITH DIFFERENTIAL/PLATELET
BASOS ABS: 0 10*3/uL (ref 0.0–0.1)
Basophils Relative: 0.4 % (ref 0.0–3.0)
EOS ABS: 0.2 10*3/uL (ref 0.0–0.7)
Eosinophils Relative: 2.2 % (ref 0.0–5.0)
HEMATOCRIT: 44.4 % (ref 39.0–52.0)
Hemoglobin: 15.1 g/dL (ref 13.0–17.0)
LYMPHS PCT: 24.5 % (ref 12.0–46.0)
Lymphs Abs: 2.1 10*3/uL (ref 0.7–4.0)
MCHC: 34.1 g/dL (ref 30.0–36.0)
MCV: 86.7 fl (ref 78.0–100.0)
MONOS PCT: 6.2 % (ref 3.0–12.0)
Monocytes Absolute: 0.5 10*3/uL (ref 0.1–1.0)
NEUTROS PCT: 66.7 % (ref 43.0–77.0)
Neutro Abs: 5.8 10*3/uL (ref 1.4–7.7)
PLATELETS: 267 10*3/uL (ref 150.0–400.0)
RBC: 5.12 Mil/uL (ref 4.22–5.81)
RDW: 14.2 % (ref 11.5–15.5)
WBC: 8.7 10*3/uL (ref 4.0–10.5)

## 2015-02-12 LAB — GLUCOSE, POCT (MANUAL RESULT ENTRY): POC Glucose: 157 mg/dl — AB (ref 70–99)

## 2015-02-12 LAB — HEMOGLOBIN A1C: Hgb A1c MFr Bld: 10.1 % — ABNORMAL HIGH (ref 4.6–6.5)

## 2015-02-12 LAB — MICROALBUMIN / CREATININE URINE RATIO
CREATININE, U: 152.6 mg/dL
MICROALB UR: 1.5 mg/dL (ref 0.0–1.9)
Microalb Creat Ratio: 1 mg/g (ref 0.0–30.0)

## 2015-02-12 LAB — TSH: TSH: 2.03 u[IU]/mL (ref 0.35–4.50)

## 2015-02-12 MED ORDER — ASPIRIN EC 81 MG PO TBEC: 81.0000 mg | DELAYED_RELEASE_TABLET | Freq: Every day | ORAL | Status: DC

## 2015-02-12 MED ORDER — CANAGLIFLOZIN 300 MG PO TABS: 300.0000 mg | ORAL_TABLET | Freq: Every day | ORAL | Status: DC

## 2015-02-12 MED ORDER — VALSARTAN 160 MG PO TABS: 160.0000 mg | ORAL_TABLET | Freq: Every day | ORAL | Status: DC

## 2015-02-12 MED ORDER — INSULIN GLARGINE 300 UNIT/ML ~~LOC~~ SOPN: 50.0000 [IU] | PEN_INJECTOR | Freq: Every day | SUBCUTANEOUS | Status: DC

## 2015-02-12 MED ORDER — BENAZEPRIL HCL 20 MG PO TABS: 20.0000 mg | ORAL_TABLET | Freq: Every day | ORAL | Status: DC

## 2015-02-12 NOTE — Progress Notes (Signed)
Pre visit review using our clinic review tool, if applicable. No additional management support is needed unless otherwise documented below in the visit note. 

## 2015-02-12 NOTE — Assessment & Plan Note (Signed)
Will recheck his FLP Now that he has DM2 will likely start a statin

## 2015-02-12 NOTE — Addendum Note (Signed)
Addended by: Janith Lima on: 02/12/2015 02:55 PM   Modules accepted: Orders

## 2015-02-12 NOTE — Assessment & Plan Note (Signed)
He appears to be doing well on metformin I will check his A1C today, will monitor his renal function He was referred for an eye exam and for diabetic education

## 2015-02-12 NOTE — Assessment & Plan Note (Signed)
He wants to have this evaluated and treated

## 2015-02-12 NOTE — Assessment & Plan Note (Signed)
His BP is not adequately well controlled as he has not been taking the ACEI due to a side effect I will start an ARB for BP control and renal protection Will check his labs to screen for end organ damage and secondary causes of HTN He will work on his lifestyle modifications

## 2015-02-12 NOTE — Patient Instructions (Signed)

## 2015-02-12 NOTE — Progress Notes (Signed)
Subjective:    Patient ID: Joseph Wood, male    DOB: 23-Feb-1971, 44 y.o.   MRN: [id number]  Hypertension Pertinent negatives include no blurred vision, chest pain, palpitations or shortness of breath.  Diabetes He presents for his initial diabetic visit. He has type 2 diabetes mellitus. His disease course has been fluctuating. There are no hypoglycemic associated symptoms. Associated symptoms include polydipsia and polyuria. Pertinent negatives for diabetes include no blurred vision, no chest pain, no fatigue, no foot paresthesias, no foot ulcerations, no visual change, no weakness and no weight loss. There are no hypoglycemic complications. There are no diabetic complications. Current diabetic treatment includes oral agent (monotherapy). He is compliant with treatment all of the time. His weight is stable. He is following a generally unhealthy diet. When asked about meal planning, he reported none. He has not had a previous visit with a dietitian. He never participates in exercise. There is no change in his home blood glucose trend. His breakfast blood glucose range is generally 110-130 mg/dl. His dinner blood glucose range is generally 140-180 mg/dl. His highest blood glucose is 140-180 mg/dl. His overall blood glucose range is 140-180 mg/dl. An ACE inhibitor/angiotensin II receptor blocker is not being taken. He does not see a podiatrist.Eye exam is not current.      Review of Systems  Constitutional: Negative.  Negative for chills, weight loss, diaphoresis, appetite change and fatigue.  HENT: Negative.   Eyes: Negative.  Negative for blurred vision.  Respiratory: Positive for apnea. Negative for cough, choking, chest tightness, shortness of breath and stridor.   Cardiovascular: Negative.  Negative for chest pain, palpitations and leg swelling.  Gastrointestinal: Negative.  Negative for nausea, vomiting, abdominal pain, diarrhea, constipation and blood in stool.  Endocrine: Positive for  polydipsia and polyuria.  Genitourinary: Positive for frequency. Negative for dysuria, urgency, hematuria, flank pain and decreased urine volume.  Musculoskeletal: Negative.  Negative for myalgias, back pain, joint swelling and arthralgias.  Skin: Negative.  Negative for rash.  Allergic/Immunologic: Negative.   Neurological: Negative.  Negative for weakness.  Hematological: Negative.  Negative for adenopathy. Does not bruise/bleed easily.  Psychiatric/Behavioral: Negative.        Objective:   Physical Exam  Constitutional: He is oriented to person, place, and time. He appears well-developed and well-nourished. No distress.  HENT:  Head: Normocephalic and atraumatic.  Mouth/Throat: Oropharynx is clear and moist. No oropharyngeal exudate.  Eyes: Conjunctivae are normal. Right eye exhibits no discharge. Left eye exhibits no discharge. No scleral icterus.  Neck: Normal range of motion. Neck supple. No JVD present. No tracheal deviation present. No thyromegaly present.  Cardiovascular: Normal rate, regular rhythm, normal heart sounds and intact distal pulses.  Exam reveals no gallop and no friction rub.   No murmur heard. Pulmonary/Chest: Effort normal and breath sounds normal. No stridor. No respiratory distress. He has no wheezes. He has no rales. He exhibits no tenderness.  Abdominal: Soft. Bowel sounds are normal. He exhibits no distension and no mass. There is no tenderness. There is no rebound and no guarding.  Musculoskeletal: Normal range of motion. He exhibits no edema or tenderness.  Lymphadenopathy:    He has no cervical adenopathy.  Neurological: He is oriented to person, place, and time.  Skin: Skin is warm and dry. No rash noted. He is not diaphoretic. No erythema. No pallor.  Vitals reviewed.     Lab Results  Component Value Date   WBC 10.6* 02/05/2015   HGB 16.0 02/05/2015  HCT 48.2 02/05/2015   PLT 268 02/05/2015   GLUCOSE 239* 02/05/2015   CHOL 179 09/05/2012    TRIG 122.0 09/05/2012   HDL 48.90 09/05/2012   LDLCALC 106* 09/05/2012   ALT 33 02/05/2015   AST 20 02/05/2015   NA 136 02/05/2015   K 3.9 02/05/2015   CL 103 02/05/2015   CREATININE 1.05 02/05/2015   BUN 21 02/05/2015   CO2 27 02/05/2015   TSH 2.70 09/05/2012   PSA 0.78 09/05/2012   HGBA1C 5.7 09/05/2012      Assessment & Plan:

## 2015-02-14 ENCOUNTER — Telehealth: Payer: Self-pay | Admitting: Internal Medicine

## 2015-02-14 NOTE — Telephone Encounter (Signed)
emmi emailed °

## 2015-02-17 ENCOUNTER — Encounter: Payer: Self-pay | Admitting: Internal Medicine

## 2015-02-17 ENCOUNTER — Ambulatory Visit (INDEPENDENT_AMBULATORY_CARE_PROVIDER_SITE_OTHER): Payer: BLUE CROSS/BLUE SHIELD | Admitting: Internal Medicine

## 2015-02-17 VITALS — BP 118/80 | HR 75 | Temp 98.3°F | Resp 16 | Ht 69.0 in | Wt >= 6400 oz

## 2015-02-17 DIAGNOSIS — E118 Type 2 diabetes mellitus with unspecified complications: Secondary | ICD-10-CM

## 2015-02-17 DIAGNOSIS — I10 Essential (primary) hypertension: Secondary | ICD-10-CM

## 2015-02-17 MED ORDER — GLUCOSE BLOOD VI STRP: ORAL_STRIP | Status: DC

## 2015-02-17 MED ORDER — ONETOUCH ULTRASOFT LANCETS MISC: Status: DC

## 2015-02-17 MED ORDER — INSULIN PEN NEEDLE 31G X 5 MM MISC: Status: DC

## 2015-02-17 NOTE — Patient Instructions (Signed)

## 2015-02-17 NOTE — Assessment & Plan Note (Signed)
His blood sugars are not well controlled Will try a regimen of 2 oral meds plus basal insulin He has started lifestyle modifications, will refer for diabetic education

## 2015-02-17 NOTE — Progress Notes (Signed)
Pre visit review using our clinic review tool, if applicable. No additional management support is needed unless otherwise documented below in the visit note. 

## 2015-02-17 NOTE — Assessment & Plan Note (Signed)
His BP is well controlled on the ARB

## 2015-02-17 NOTE — Progress Notes (Signed)
   Subjective:    Patient ID: Joseph Wood, male    DOB: [birth year], 44 y.o.   MRN: [id number]  HPI  He returns for f/up and to learn how to give the insulin injections, he offers no complains today.  Review of Systems  Constitutional: Negative.  Negative for fever, chills and fatigue.  HENT: Negative.   Eyes: Negative.   Respiratory: Negative.  Negative for cough, choking, shortness of breath and stridor.   Cardiovascular: Negative.  Negative for chest pain, palpitations and leg swelling.  Gastrointestinal: Negative.  Negative for abdominal pain.  Endocrine: Positive for polyphagia. Negative for polydipsia and polyuria.  Genitourinary: Negative.   Musculoskeletal: Negative.   Skin: Negative.   Allergic/Immunologic: Negative.   Neurological: Negative.  Negative for dizziness, tremors, weakness, light-headedness and numbness.  Hematological: Negative.  Negative for adenopathy. Does not bruise/bleed easily.  Psychiatric/Behavioral: Negative.   All other systems reviewed and are negative.      Objective:   Physical Exam  Constitutional: He is oriented to person, place, and time. He appears well-developed and well-nourished. No distress.  HENT:  Head: Normocephalic and atraumatic.  Mouth/Throat: Oropharynx is clear and moist. No oropharyngeal exudate.  Eyes: Conjunctivae are normal. Right eye exhibits no discharge. Left eye exhibits no discharge. No scleral icterus.  Neck: Normal range of motion. Neck supple. No JVD present. No tracheal deviation present. No thyromegaly present.  Cardiovascular: Normal rate, regular rhythm, normal heart sounds and intact distal pulses.  Exam reveals no gallop and no friction rub.   No murmur heard. Pulmonary/Chest: Effort normal and breath sounds normal. No stridor. No respiratory distress. He has no wheezes. He has no rales. He exhibits no tenderness.  Abdominal: Soft. Bowel sounds are normal. He exhibits no distension and no mass. There is no  tenderness. There is no rebound and no guarding.  Musculoskeletal: Normal range of motion. He exhibits no edema or tenderness.  Lymphadenopathy:    He has no cervical adenopathy.  Neurological: He is oriented to person, place, and time.  Skin: Skin is warm and dry. No rash noted. He is not diaphoretic. No erythema. No pallor.  Vitals reviewed.    Lab Results  Component Value Date   WBC 8.7 02/12/2015   HGB 15.1 02/12/2015   HCT 44.4 02/12/2015   PLT 267.0 02/12/2015   GLUCOSE 171* 02/12/2015   CHOL 166 02/12/2015   TRIG 135.0 02/12/2015   HDL 44.80 02/12/2015   LDLCALC 94 02/12/2015   ALT 30 02/12/2015   AST 24 02/12/2015   NA 139 02/12/2015   K 4.1 02/12/2015   CL 105 02/12/2015   CREATININE 1.08 02/12/2015   BUN 18 02/12/2015   CO2 28 02/12/2015   TSH 2.03 02/12/2015   PSA 0.78 09/05/2012   HGBA1C 10.1* 02/12/2015   MICROALBUR 1.5 02/12/2015       Assessment & Plan:

## 2015-02-18 ENCOUNTER — Telehealth: Payer: Self-pay | Admitting: Internal Medicine

## 2015-02-18 NOTE — Telephone Encounter (Signed)
emmi emailed °

## 2015-03-10 ENCOUNTER — Encounter: Payer: Self-pay | Admitting: Dietician

## 2015-03-10 ENCOUNTER — Encounter: Payer: BLUE CROSS/BLUE SHIELD | Attending: Internal Medicine | Admitting: Dietician

## 2015-03-10 VITALS — Ht 70.0 in | Wt >= 6400 oz

## 2015-03-10 DIAGNOSIS — Z6841 Body Mass Index (BMI) 40.0 and over, adult: Secondary | ICD-10-CM | POA: Insufficient documentation

## 2015-03-10 DIAGNOSIS — Z713 Dietary counseling and surveillance: Secondary | ICD-10-CM | POA: Insufficient documentation

## 2015-03-10 DIAGNOSIS — E118 Type 2 diabetes mellitus with unspecified complications: Secondary | ICD-10-CM | POA: Diagnosis not present

## 2015-03-10 DIAGNOSIS — Z794 Long term (current) use of insulin: Secondary | ICD-10-CM | POA: Diagnosis not present

## 2015-03-10 NOTE — Patient Instructions (Signed)
Consistent meals are great!  Try not to skip.  Breakfast idea:  Boost Glucose Control and fruit  1/2 cup grits with 1 oz cheese and fruit  Smoothie: 1 cup plain yogurt, 1/2 banana, 1 cup strawberries (or mixed fruit), 1/2 cup milk Plan:  Aim for 4 Carb Choices per meal (60 grams) +/- 1 either way  Aim for 0-2 Carbs per snack if hungry  Include protein in moderation with your meals and snacks Consider reading food labels for Total Carbohydrate and Fat Grams of foods Continue to walk or get other exercise that you enjoy daily. Continue checking BG at alternate times per day as directed by MD  Consider taking medication as directed by MD

## 2015-03-10 NOTE — Progress Notes (Signed)
Diabetes Self-Management Education  Visit Type:    Appt. Start Time: 0800 Appt. End Time: 0930  03/10/2015  [name], identified by name and date of birth, is a 44 y.o. male with a diagnosis of Diabetes: Type 2 one month ago.  .  Patient is here alone.  He has researched on his own since being diagnosed and has started to used brown rice, whole wheat bread, decreased portion sizes, fried food and sugar.  He has lost from 413 lbs 1 month ago to 409 lbs today.    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  He lives with his sister, brother in law and niece.    ASSESSMENT  Height 5' 10"  (1.778 m), weight 409 lb (185.521 kg). Body mass index is 58.69 kg/(m^2).  Initial Visit Information:  Are you currently following a meal plan?: Yes What type of meal plan do you follow?: changed to [employer] bread, brown rice, increasing non starchy vegetables, eleminated sugar drinks Are you taking your medications as prescribed?: Yes Are you checking your feet?: Yes How many days per week are you checking your feet?: 3 How often do you need to have someone help you when you read instructions, pamphlets, or other written materials from your doctor or pharmacy?: 1 - Never What is the last grade level you completed in school?: 9th grade HS plus GED  Psychosocial:   Patient Belief/Attitude about Diabetes: Motivated to manage diabetes Self-care barriers: None Self-management support: Doctor's office, Friends (exercising with friend) Other persons present: Patient Patient Concerns: Nutrition/Meal planning, Healthy Lifestyle, Weight Control Special Needs: None Preferred Learning Style: No preference indicated Learning Readiness: Change in progress  Complications:   Last HgB A1C per patient/outside source: 10.1 mg/dL (02/12/15) How often do you check your blood sugar?: 1-2 times/day (2-3 times per day) Fasting Blood glucose range (mg/dL): 70-129 Postprandial Blood glucose  range (mg/dL): 70-129 Number of hypoglycemic episodes per month: 0 Number of hyperglycemic episodes per week: 1 Have you had a dilated eye exam in the past 12 months?: No (patient has an appointment next month) Have you had a dental exam in the past 12 months?: No  Diet Intake:  Breakfast: instant grits and fresh fruit or fruit cup or sugar free pudding to take medicine Lunch: leftovers or sandwich with fruit (used to skip) Snack (afternoon): pringles and dip Dinner: pot roast or chicken or fish or steak, brown rice, vegetables Snack (evening): pringles or NABS Beverage(s): water, rare diet soda  Exercise:  Exercise: Light (walking / raking leaves) (walking, bike riding, hard work) Light Exercise amount of time (min / week): 150 (45 minutes walking per day)  Individualized Plan for Diabetes Self-Management Training:   Learning Objective:  Patient will have a greater understanding of diabetes self-management.  Patient education plan per assessed needs and concerns is to attend individual sessions for     Education Topics Reviewed with Patient Today:  Definition of diabetes, type 1 and 2, and the diagnosis of diabetes, Factors that contribute to the development of diabetes Role of diet in the treatment of diabetes and the relationship between the three main macronutrients and blood glucose level, Food label reading, portion sizes and measuring food., Carbohydrate counting, Reviewed blood glucose goals for pre and post meals and how to evaluate the patients' food intake on their blood glucose level., Meal options for control of blood glucose level and chronic complications. Role of exercise on diabetes management, blood pressure control and cardiac health.  Interpreting lab values - A1C, lipid, urine microalbumina., Yearly dilated eye exam, Daily foot exams, Taught/discussed recording of test results and interpretation of SMBG.           PATIENTS GOALS/Plan (Developed by the  patient):   Plan:   Patient Instructions  Consistent meals are great!  Try not to skip.  Breakfast idea:  Boost Glucose Control and fruit  1/2 cup grits with 1 oz cheese and fruit  Smoothie: 1 cup plain yogurt, 1/2 banana, 1 cup strawberries (or mixed fruit), 1/2 cup milk Plan:  Aim for 4 Carb Choices per meal (60 grams) +/- 1 either way  Aim for 0-2 Carbs per snack if hungry  Include protein in moderation with your meals and snacks Consider reading food labels for Total Carbohydrate and Fat Grams of foods Continue to walk or get other exercise that you enjoy daily. Continue checking BG at alternate times per day as directed by MD  Consider taking medication as directed by MD    Expected Outcomes:     Education material provided: Living Well with Diabetes, Food label handouts, A1C conversion sheet, Meal plan card, My Plate and Snack sheet  If problems or questions, patient to contact team via:  Phone and Email  Future DSME appointment:  prn

## 2015-04-14 ENCOUNTER — Institutional Professional Consult (permissible substitution): Payer: BLUE CROSS/BLUE SHIELD | Admitting: Pulmonary Disease

## 2015-04-28 ENCOUNTER — Ambulatory Visit (INDEPENDENT_AMBULATORY_CARE_PROVIDER_SITE_OTHER): Payer: BLUE CROSS/BLUE SHIELD | Admitting: Pulmonary Disease

## 2015-04-28 ENCOUNTER — Encounter: Payer: Self-pay | Admitting: Pulmonary Disease

## 2015-04-28 DIAGNOSIS — G4733 Obstructive sleep apnea (adult) (pediatric): Secondary | ICD-10-CM

## 2015-04-28 LAB — HM DIABETES EYE EXAM

## 2015-04-28 NOTE — Assessment & Plan Note (Signed)
The patient's history is very suggestive of clinically significant sleep disordered breathing. I have had a long discussion with him today about sleep apnea, including its impact to his quality of life and cardiovascular health. I think he needs to have a sleep study in order to make a diagnosis, and he is an excellent candidate for home sleep testing.  The patient is agreeable to this approach.

## 2015-04-28 NOTE — Progress Notes (Signed)
   Subjective:    Patient ID: Joseph Wood, male    DOB: October 14, 1971, 44 y.o.   MRN: [id number]  HPI The patient is a 44 year old male who I've been asked to see for possible obstructive sleep apnea. He is been noted to have loud snoring, as well as an abnormal breathing pattern during sleep. He has frequent awakenings, and is not rested in the mornings upon arising. However, he is only getting about 4-5 hours of sleep a night. He does note inappropriate daytime sleepiness with inactivity, and can doze in the evening watching television or movies. He does get some sleepiness with driving. The patient states his weight is up 10 pounds over the last 2 years, and he did not fill out his Epworth sleepiness score.   Sleep Questionnaire What time do you typically go to bed?( Between what hours) 10-12 10-12 at 1501 on 04/28/15 by Glean Hess, CMA How long does it take you to fall asleep? 10-15 minutes 10-15 minutes at 1501 on 04/28/15 by Glean Hess, CMA How many times during the night do you wake up? 3 3 at 1501 on 04/28/15 by Glean Hess, CMA What time do you get out of bed to start your day? 0400 0400 at 1501 on 04/28/15 by Glean Hess, CMA Do you drive or operate heavy machinery in your occupation? No No at 1501 on 04/28/15 by Glean Hess, CMA How much has your weight changed (up or down) over the past two years? (In pounds) 10 lb (4.536 kg) 10 lb (4.536 kg) at 1501 on 04/28/15 by Glean Hess, CMA Have you ever had a sleep study before? No No at 1501 on 04/28/15 by Glean Hess, CMA Do you currently use CPAP? No No at 1501 on 04/28/15 by Glean Hess, CMA Do you wear oxygen at any time? No No at 1501 on 04/28/15 by Glean Hess, CMA   Review of Systems  Constitutional: Negative for fever and unexpected weight change.  HENT: Negative for congestion, dental problem, ear pain, nosebleeds, postnasal drip, rhinorrhea, sinus pressure, sneezing, sore throat  and trouble swallowing.   Eyes: Negative for redness and itching.  Respiratory: Negative for cough, chest tightness, shortness of breath and wheezing.   Cardiovascular: Negative for palpitations and leg swelling.  Gastrointestinal: Negative for nausea and vomiting.  Genitourinary: Negative for dysuria.  Musculoskeletal: Negative for joint swelling.  Skin: Negative for rash.  Neurological: Negative for headaches.  Hematological: Does not bruise/bleed easily.  Psychiatric/Behavioral: Negative for dysphoric mood. The patient is not nervous/anxious.        Objective:   Physical Exam Constitutional:  Well developed, no acute distress  HENT:  Nares patent without discharge  Oropharynx without exudate, palate and uvula are thick and elongated.  Eyes:  Perrla, eomi, no scleral icterus  Neck:  No JVD, no TMG  Cardiovascular:  Normal rate, regular rhythm, no rubs or gallops.  No murmurs        Intact distal pulses  Pulmonary :  Normal breath sounds, no stridor or respiratory distress   No rales, rhonchi, or wheezing  Abdominal:  Soft, nondistended, bowel sounds present.  No tenderness noted.   Musculoskeletal:  mild lower extremity edema noted.  Lymph Nodes:  No cervical lymphadenopathy noted  Skin:  No cyanosis noted  Neurologic:  Appears sleepy but appropriate, moves all 4 extremities without obvious deficit.         Assessment & Plan:

## 2015-04-28 NOTE — Patient Instructions (Signed)
Will schedule for home sleep testing.  Will arrange followup once the results are available.

## 2015-05-01 ENCOUNTER — Encounter: Payer: Self-pay | Admitting: Internal Medicine

## 2015-05-09 ENCOUNTER — Telehealth: Payer: Self-pay | Admitting: Pulmonary Disease

## 2015-05-09 NOTE — Telephone Encounter (Signed)
I checked and we have received approval from [facility] to do pt's HST.  I called pt & scheduled him to pick up HST on 05/19/15 (per his request that he needs to do this on a Monday night & we are closed 05/12/15).

## 2015-05-09 NOTE — Telephone Encounter (Signed)
Spoke with pt, states he is checking on the status of his home sleep test.  Pt was seen 5/16 and ordered then, and was told it could take 3-4 weeks to order but wants to know if this has been approved by his insurance or if he needs to do a in-clinic sleep study.   PCC's please advise.  Thanks!

## 2015-05-19 DIAGNOSIS — G4730 Sleep apnea, unspecified: Secondary | ICD-10-CM | POA: Diagnosis not present

## 2015-05-21 ENCOUNTER — Telehealth: Payer: Self-pay | Admitting: Pulmonary Disease

## 2015-05-21 DIAGNOSIS — G4730 Sleep apnea, unspecified: Secondary | ICD-10-CM | POA: Diagnosis not present

## 2015-05-21 DIAGNOSIS — G4733 Obstructive sleep apnea (adult) (pediatric): Secondary | ICD-10-CM | POA: Diagnosis not present

## 2015-05-21 NOTE — Telephone Encounter (Signed)
Pt is scheduled for Friday at 3:30

## 2015-05-21 NOTE — Telephone Encounter (Signed)
Pt needs ov to review sleep study results. ?next week.

## 2015-05-22 ENCOUNTER — Other Ambulatory Visit: Payer: Self-pay | Admitting: *Deleted

## 2015-05-22 ENCOUNTER — Telehealth: Payer: Self-pay | Admitting: Internal Medicine

## 2015-05-22 DIAGNOSIS — G4733 Obstructive sleep apnea (adult) (pediatric): Secondary | ICD-10-CM

## 2015-05-22 MED ORDER — METFORMIN HCL 500 MG PO TABS: 500.0000 mg | ORAL_TABLET | Freq: Two times a day (BID) | ORAL | Status: DC

## 2015-05-22 NOTE — Telephone Encounter (Signed)
Refill sent to [facility],,,/lmb

## 2015-05-22 NOTE — Telephone Encounter (Signed)
Patient is needing a refill for metFORMIN (GLUCOPHAGE) 500 MG tablet [[id number]] . Pharmacy is [facility] on [address]

## 2015-05-23 ENCOUNTER — Ambulatory Visit (INDEPENDENT_AMBULATORY_CARE_PROVIDER_SITE_OTHER): Payer: BLUE CROSS/BLUE SHIELD | Admitting: Pulmonary Disease

## 2015-05-23 ENCOUNTER — Encounter: Payer: Self-pay | Admitting: Pulmonary Disease

## 2015-05-23 DIAGNOSIS — G4733 Obstructive sleep apnea (adult) (pediatric): Secondary | ICD-10-CM | POA: Diagnosis not present

## 2015-05-23 NOTE — Progress Notes (Signed)
   Subjective:    Patient ID: Joseph Wood, male    DOB: 1971/05/24, 43 y.o.   MRN: [id number]  HPI The patient comes in today for follow-up of his recent sleep study. He was found to have mild to moderate OSA, with an AHI of 17 events per hour. He did have severe desaturation as low as 66%, and I suspect this was during REM. I have reviewed the study with him in detail, and answered all of his questions.   Review of Systems  Constitutional: Negative for fever and unexpected weight change.  HENT: Negative for congestion, dental problem, ear pain, nosebleeds, postnasal drip, rhinorrhea, sinus pressure, sneezing, sore throat and trouble swallowing.   Eyes: Negative for redness and itching.  Respiratory: Negative for cough, chest tightness, shortness of breath and wheezing.   Cardiovascular: Negative for palpitations and leg swelling.  Gastrointestinal: Negative for nausea and vomiting.  Genitourinary: Negative for dysuria.  Musculoskeletal: Negative for joint swelling.  Skin: Negative for rash.  Neurological: Negative for headaches.  Hematological: Does not bruise/bleed easily.  Psychiatric/Behavioral: Negative for dysphoric mood. The patient is not nervous/anxious.        Objective:   Physical Exam Morbidly obese male in no acute distress Nose without purulence or discharge noted Neck without lymphadenopathy or thyromegaly Lower extremities with edema noted, no cyanosis Alert and oriented, moves all 4 extremities.       Assessment & Plan:

## 2015-05-23 NOTE — Assessment & Plan Note (Signed)
The patient has mild to moderate obstructive sleep apnea by his recent home sleep test, but is very symptomatic during the night and during the day.  He obviously needs to work on weight reduction, but I also talked with him about a dental appliance and C Pap. I really think that his best treatment option is C Pap while working on weight loss. The patient is agreeable to this approach.

## 2015-05-23 NOTE — Patient Instructions (Signed)
Will start on cpap with a moderate pressure level.  Please call if having tolerance issues. Work on weight loss. followup with Dr.Sood in 8 weeks.

## 2015-06-13 ENCOUNTER — Other Ambulatory Visit: Payer: Self-pay | Admitting: Internal Medicine

## 2015-06-13 NOTE — Telephone Encounter (Signed)
Patient need a refill of insulin pen Toujeo solostar

## 2015-06-23 ENCOUNTER — Other Ambulatory Visit (INDEPENDENT_AMBULATORY_CARE_PROVIDER_SITE_OTHER): Payer: BLUE CROSS/BLUE SHIELD

## 2015-06-23 ENCOUNTER — Encounter: Payer: Self-pay | Admitting: Internal Medicine

## 2015-06-23 ENCOUNTER — Ambulatory Visit (INDEPENDENT_AMBULATORY_CARE_PROVIDER_SITE_OTHER): Payer: BLUE CROSS/BLUE SHIELD | Admitting: Internal Medicine

## 2015-06-23 VITALS — BP 114/80 | HR 78 | Temp 98.3°F | Resp 16 | Ht 70.0 in | Wt 387.2 lb

## 2015-06-23 DIAGNOSIS — I10 Essential (primary) hypertension: Secondary | ICD-10-CM

## 2015-06-23 DIAGNOSIS — E118 Type 2 diabetes mellitus with unspecified complications: Secondary | ICD-10-CM

## 2015-06-23 DIAGNOSIS — Z0000 Encounter for general adult medical examination without abnormal findings: Secondary | ICD-10-CM

## 2015-06-23 LAB — CBC WITH DIFFERENTIAL/PLATELET
BASOS PCT: 0.4 % (ref 0.0–3.0)
Basophils Absolute: 0 10*3/uL (ref 0.0–0.1)
EOS PCT: 2.4 % (ref 0.0–5.0)
Eosinophils Absolute: 0.2 10*3/uL (ref 0.0–0.7)
HEMATOCRIT: 47.4 % (ref 39.0–52.0)
HEMOGLOBIN: 15.6 g/dL (ref 13.0–17.0)
Lymphocytes Relative: 19.6 % (ref 12.0–46.0)
Lymphs Abs: 1.8 10*3/uL (ref 0.7–4.0)
MCHC: 33 g/dL (ref 30.0–36.0)
MCV: 87.4 fl (ref 78.0–100.0)
Monocytes Absolute: 0.6 10*3/uL (ref 0.1–1.0)
Monocytes Relative: 6.5 % (ref 3.0–12.0)
NEUTROS ABS: 6.4 10*3/uL (ref 1.4–7.7)
Neutrophils Relative %: 71.1 % (ref 43.0–77.0)
Platelets: 288 10*3/uL (ref 150.0–400.0)
RBC: 5.43 Mil/uL (ref 4.22–5.81)
RDW: 15.3 % (ref 11.5–15.5)
WBC: 9 10*3/uL (ref 4.0–10.5)

## 2015-06-23 LAB — BASIC METABOLIC PANEL
BUN: 25 mg/dL — ABNORMAL HIGH (ref 6–23)
CO2: 28 meq/L (ref 19–32)
CREATININE: 1.06 mg/dL (ref 0.40–1.50)
Calcium: 9.4 mg/dL (ref 8.4–10.5)
Chloride: 105 mEq/L (ref 96–112)
GFR: 80.66 mL/min (ref >60.00)
Glucose, Bld: 102 mg/dL — ABNORMAL HIGH (ref 70–99)
Potassium: 3.9 mEq/L (ref 3.5–5.1)
SODIUM: 141 meq/L (ref 135–145)

## 2015-06-23 LAB — FECAL OCCULT BLOOD, GUAIAC: FECAL OCCULT BLD: POSITIVE

## 2015-06-23 LAB — HEMOGLOBIN A1C: HEMOGLOBIN A1C: 6 % (ref 4.6–6.5)

## 2015-06-23 LAB — PSA: PSA: 1.01 ng/mL (ref 0.10–4.00)

## 2015-06-23 NOTE — Patient Instructions (Signed)

## 2015-06-23 NOTE — Progress Notes (Signed)
Subjective:  Patient ID: Joseph Wood, male    DOB: [birth year]  Age: 44 y.o. MRN: [id number]  CC: Diabetes and Annual Exam   HPI Joseph Wood presents for a complete physical and follow-up on diabetes. He has the rare occasion of polyuria but otherwise feels well.   Outpatient Prescriptions Prior to Visit  Medication Sig Dispense Refill  . aspirin EC 81 MG tablet Take 1 tablet (81 mg total) by mouth daily. 90 tablet 3  . canagliflozin (INVOKANA) 300 MG TABS tablet Take 300 mg by mouth daily before breakfast. 90 tablet 3  . glucose blood (ONETOUCH VERIO) test strip Test up to TID 100 each 12  . Insulin Pen Needle 31G X 5 MM MISC Use with insulin 100 each 3  . Lancets (ONETOUCH ULTRASOFT) lancets Test up to TID 100 each 12  . metFORMIN (GLUCOPHAGE) 500 MG tablet Take 1 tablet (500 mg total) by mouth 2 (two) times daily with a meal. 60 tablet 5  . OMEPRAZOLE PO Take 1 tablet by mouth daily as needed (reflux.).    Marland Kitchen valsartan (DIOVAN) 160 MG tablet Take 1 tablet (160 mg total) by mouth daily. 90 tablet 3  . TOUJEO SOLOSTAR 300 UNIT/ML SOPN INJECT 50 UNITS SUBCUTANEOUSLY INTO THE SKIN DAILY 6 pen 2   No facility-administered medications prior to visit.    ROS Review of Systems  Constitutional: Negative.  Negative for fever, chills, diaphoresis, appetite change and fatigue.  HENT: Negative.  Negative for trouble swallowing and voice change.   Eyes: Negative.   Respiratory: Negative.  Negative for cough, choking, chest tightness, shortness of breath and stridor.   Cardiovascular: Negative.  Negative for chest pain, palpitations and leg swelling.  Gastrointestinal: Negative.  Negative for nausea, vomiting, abdominal pain, diarrhea and constipation.  Endocrine: Positive for polyuria. Negative for polydipsia and polyphagia.  Genitourinary: Negative.   Musculoskeletal: Negative.  Negative for myalgias, back pain, joint swelling and arthralgias.  Skin: Negative.  Negative for  rash.  Allergic/Immunologic: Negative.   Neurological: Negative.  Negative for dizziness, tremors, syncope, light-headedness and numbness.  Hematological: Negative.  Negative for adenopathy. Does not bruise/bleed easily.  Psychiatric/Behavioral: Negative.     Objective:  BP 114/80 mmHg  Pulse 78  Temp(Src) 98.3 F (36.8 C) (Oral)  Resp 16  Ht 5' 10"  (1.778 m)  Wt 387 lb 4 oz (175.655 kg)  BMI 55.56 kg/m2  SpO2 95%  BP Readings from Last 3 Encounters:  06/23/15 114/80  05/23/15 126/74  04/28/15 110/92    Wt Readings from Last 3 Encounters:  06/23/15 387 lb 4 oz (175.655 kg)  05/23/15 394 lb 9.6 oz (178.989 kg)  04/28/15 391 lb (177.356 kg)    Physical Exam  Constitutional: He is oriented to person, place, and time. He appears well-developed and well-nourished. No distress.  HENT:  Mouth/Throat: Oropharynx is clear and moist. No oropharyngeal exudate.  Eyes: Conjunctivae are normal. Right eye exhibits no discharge. Left eye exhibits no discharge. No scleral icterus.  Neck: Normal range of motion. Neck supple. No JVD present. No tracheal deviation present. No thyromegaly present.  Cardiovascular: Normal rate, regular rhythm, normal heart sounds and intact distal pulses.  Exam reveals no gallop and no friction rub.   No murmur heard. Pulmonary/Chest: Effort normal and breath sounds normal. No stridor. No respiratory distress. He has no wheezes. He has no rales. He exhibits no tenderness.  Abdominal: Soft. Bowel sounds are normal. He exhibits no distension and no mass. There is  no tenderness. There is no rebound and no guarding. Hernia confirmed negative in the right inguinal area and confirmed negative in the left inguinal area.  Genitourinary: Rectum normal, prostate normal, testes normal and penis normal. Rectal exam shows no external hemorrhoid, no internal hemorrhoid, no fissure, no mass, no tenderness and anal tone normal. Guaiac negative stool. Prostate is not enlarged and  not tender. Right testis shows no mass, no swelling and no tenderness. Right testis is descended. Left testis shows no mass, no swelling and no tenderness. Left testis is descended. Uncircumcised. No penile erythema or penile tenderness. No discharge found.  Musculoskeletal: Normal range of motion. He exhibits no edema or tenderness.  Lymphadenopathy:    He has no cervical adenopathy.       Right: No inguinal adenopathy present.       Left: No inguinal adenopathy present.  Neurological: He is oriented to person, place, and time.  Skin: Skin is warm and dry. No rash noted. He is not diaphoretic. No erythema. No pallor.  Psychiatric: He has a normal mood and affect. His behavior is normal. Judgment and thought content normal.    Lab Results  Component Value Date   WBC 9.0 06/23/2015   HGB 15.6 06/23/2015   HCT 47.4 06/23/2015   PLT 288.0 06/23/2015   GLUCOSE 102* 06/23/2015   CHOL 166 02/12/2015   TRIG 135.0 02/12/2015   HDL 44.80 02/12/2015   LDLCALC 94 02/12/2015   ALT 30 02/12/2015   AST 24 02/12/2015   NA 141 06/23/2015   K 3.9 06/23/2015   CL 105 06/23/2015   CREATININE 1.06 06/23/2015   BUN 25* 06/23/2015   CO2 28 06/23/2015   TSH 2.03 02/12/2015   PSA 1.01 06/23/2015   HGBA1C 6.0 06/23/2015   MICROALBUR 1.5 02/12/2015    No results found.  Assessment & Plan:   Joseph Wood was seen today for diabetes and annual exam.  Diagnoses and all orders for this visit:  HYPERTENSION, BENIGN ESSENTIAL- his blood sugar is well controlled, lites and renal function are stable. Orders: -     Basic metabolic panel; Future  Type II diabetes mellitus with manifestations- his A1c is down to 6.0 so I have asked him to stop using the insulin. For now he will continue Invokana and metformin Orders: -     Basic metabolic panel; Future -     Hemoglobin A1c; Future  Routine general medical examination at a health care facility- complete exam was done, labs were ordered and he was screened  for prostate cancer. Vaccines were reviewed and updated. He was given patient education information. Orders: -     PSA; Future -     CBC with Differential/Platelet; Future   I have discontinued [name] EHMCNOBS. I am also having him maintain his OMEPRAZOLE PO, aspirin EC, valsartan, canagliflozin, onetouch ultrasoft, glucose blood, Insulin Pen Needle, and metFORMIN.  No orders of the defined types were placed in this encounter.     Follow-up: Return in about 4 months (around 10/24/2015).  Scarlette Calico, MD

## 2015-06-23 NOTE — Progress Notes (Signed)
Pre visit review using our clinic review tool, if applicable. No additional management support is needed unless otherwise documented below in the visit note. 

## 2015-06-24 ENCOUNTER — Telehealth: Payer: Self-pay | Admitting: Internal Medicine

## 2015-06-24 NOTE — Telephone Encounter (Signed)
Please call patient. He returned your call

## 2015-06-24 NOTE — Telephone Encounter (Signed)
Called pt. He received Lakisha's message and is aware of PCP instructions.

## 2015-07-28 ENCOUNTER — Ambulatory Visit: Payer: BLUE CROSS/BLUE SHIELD | Admitting: Pulmonary Disease

## 2015-07-29 ENCOUNTER — Emergency Department (HOSPITAL_COMMUNITY): Payer: BLUE CROSS/BLUE SHIELD

## 2015-07-29 ENCOUNTER — Encounter (HOSPITAL_COMMUNITY): Payer: Self-pay | Admitting: *Deleted

## 2015-07-29 ENCOUNTER — Emergency Department (HOSPITAL_COMMUNITY)
Admission: EM | Admit: 2015-07-29 | Discharge: 2015-07-29 | Disposition: A | Discharge status: Home / Self Care | Payer: BLUE CROSS/BLUE SHIELD | Attending: Emergency Medicine | Admitting: Emergency Medicine

## 2015-07-29 DIAGNOSIS — R300 Dysuria: Secondary | ICD-10-CM | POA: Insufficient documentation

## 2015-07-29 DIAGNOSIS — Z7982 Long term (current) use of aspirin: Secondary | ICD-10-CM | POA: Insufficient documentation

## 2015-07-29 DIAGNOSIS — Z8669 Personal history of other diseases of the nervous system and sense organs: Secondary | ICD-10-CM | POA: Diagnosis not present

## 2015-07-29 DIAGNOSIS — R110 Nausea: Secondary | ICD-10-CM | POA: Insufficient documentation

## 2015-07-29 DIAGNOSIS — R197 Diarrhea, unspecified: Secondary | ICD-10-CM | POA: Insufficient documentation

## 2015-07-29 DIAGNOSIS — E669 Obesity, unspecified: Secondary | ICD-10-CM | POA: Diagnosis not present

## 2015-07-29 DIAGNOSIS — Z79899 Other long term (current) drug therapy: Secondary | ICD-10-CM | POA: Insufficient documentation

## 2015-07-29 DIAGNOSIS — E119 Type 2 diabetes mellitus without complications: Secondary | ICD-10-CM | POA: Insufficient documentation

## 2015-07-29 DIAGNOSIS — R350 Frequency of micturition: Secondary | ICD-10-CM | POA: Diagnosis not present

## 2015-07-29 DIAGNOSIS — R1030 Lower abdominal pain, unspecified: Secondary | ICD-10-CM | POA: Diagnosis not present

## 2015-07-29 DIAGNOSIS — I10 Essential (primary) hypertension: Secondary | ICD-10-CM | POA: Diagnosis not present

## 2015-07-29 DIAGNOSIS — Z794 Long term (current) use of insulin: Secondary | ICD-10-CM | POA: Diagnosis not present

## 2015-07-29 DIAGNOSIS — R319 Hematuria, unspecified: Secondary | ICD-10-CM | POA: Insufficient documentation

## 2015-07-29 LAB — URINALYSIS, ROUTINE W REFLEX MICROSCOPIC
BILIRUBIN URINE: NEGATIVE
Glucose, UA: >1000 mg/dL — AB
KETONES UR: NEGATIVE mg/dL
Nitrite: NEGATIVE
PROTEIN: 100 mg/dL — AB
Specific Gravity, Urine: 1.023 (ref 1.005–1.030)
UROBILINOGEN UA: 0.2 mg/dL (ref 0.0–1.0)
pH: 5.5 (ref 5.0–8.0)

## 2015-07-29 LAB — CBC
HEMATOCRIT: 46.7 % (ref 39.0–52.0)
Hemoglobin: 15.4 g/dL (ref 13.0–17.0)
MCH: 29.1 pg (ref 26.0–34.0)
MCHC: 33 g/dL (ref 30.0–36.0)
MCV: 88.1 fL (ref 78.0–100.0)
PLATELETS: 268 10*3/uL (ref 150–400)
RBC: 5.3 MIL/uL (ref 4.22–5.81)
RDW: 14.8 % (ref 11.5–15.5)
WBC: 11.3 10*3/uL — ABNORMAL HIGH (ref 4.0–10.5)

## 2015-07-29 LAB — COMPREHENSIVE METABOLIC PANEL
ALT: 24 U/L (ref 17–63)
AST: 23 U/L (ref 15–41)
Albumin: 4.3 g/dL (ref 3.5–5.0)
Alkaline Phosphatase: 65 U/L (ref 38–126)
Anion gap: 9 (ref 5–15)
BUN: 14 mg/dL (ref 6–20)
CHLORIDE: 105 mmol/L (ref 101–111)
CO2: 27 mmol/L (ref 22–32)
CREATININE: 1.07 mg/dL (ref 0.61–1.24)
Calcium: 9.5 mg/dL (ref 8.9–10.3)
GFR calc non Af Amer: >60 mL/min (ref >60)
Glucose, Bld: 102 mg/dL — ABNORMAL HIGH (ref 65–99)
POTASSIUM: 4.1 mmol/L (ref 3.5–5.1)
Sodium: 141 mmol/L (ref 135–145)
TOTAL PROTEIN: 7.9 g/dL (ref 6.5–8.1)
Total Bilirubin: 0.7 mg/dL (ref 0.3–1.2)

## 2015-07-29 LAB — URINE MICROSCOPIC-ADD ON

## 2015-07-29 MED ORDER — CEPHALEXIN 500 MG PO CAPS: 500.0000 mg | ORAL_CAPSULE | Freq: Four times a day (QID) | ORAL | Status: DC

## 2015-07-29 MED ORDER — SODIUM CHLORIDE 0.9 % IV BOLUS (SEPSIS)
1000.0000 mL | Freq: Once | INTRAVENOUS | Status: AC
  Administered 2015-07-29: 1000 mL via INTRAVENOUS

## 2015-07-29 MED ORDER — ONDANSETRON HCL 4 MG/2ML IJ SOLN
4.0000 mg | Freq: Once | INTRAMUSCULAR | Status: AC
  Administered 2015-07-29: 4 mg via INTRAVENOUS
  Filled 2015-07-29: qty 2

## 2015-07-29 MED ORDER — PHENAZOPYRIDINE HCL 100 MG PO TABS: 100.0000 mg | ORAL_TABLET | Freq: Three times a day (TID) | ORAL | Status: DC | PRN

## 2015-07-29 NOTE — Discharge Instructions (Signed)

## 2015-07-29 NOTE — ED Provider Notes (Signed)
CSN: [id number]     Arrival date & time 07/29/15  1219 History   First MD Initiated Contact with Patient 07/29/15 1432     Chief Complaint  Patient presents with  . Abdominal Pain  . Hematuria   Joseph Wood is a 44 y.o. male with a history of morbid obesity, hypertension, and diabetes who presents to the emergency department complaining of suprapubic abdominal pain, nausea, dysuria and hematuria ongoing since early this morning. Patient reports he started having suprapubic abdominal pain around 5 AM this morning which progressed to having dysuria and hematuria. Patient reports he feels like he is passing blood clots in his urine. Patient also reports having 4 loose stools today. He denies having any flank pain. Denies history of UTIs or kidney stones. The patient reports that since arriving to the emergency department his abdominal pain has resolved. Patient denies fevers, chills, vomiting, hematemesis, hematochezia, penile discharge, testicular pain, scrotal swelling, rashes or lesions.  (Consider location/radiation/quality/duration/timing/severity/associated sxs/prior Treatment) HPI  Past Medical History  Diagnosis Date  . Hypertension   . Obesity   . Sleep apnea   . Diabetes mellitus without complication    Past Surgical History  Procedure Laterality Date  . Acl repair      Right  . Colonoscopy with propofol  11/06/2012    Procedure: COLONOSCOPY WITH PROPOFOL;  Surgeon: Jerene Bears, MD;  Location: [facility];  Service: Gastroenterology;  Laterality: N/A;  Andrea/   Family History  Problem Relation Age of Onset  . Arthritis Mother   . Hypertension Mother   . Breast cancer Mother   . Aneurysm Father     aortic  . Prostate cancer Father   . Hypertension Father   . Diabetes Mother   . Diabetes Father    Social History  Substance Use Topics  . Smoking status: Never Smoker   . Smokeless tobacco: Never Used  . Alcohol Use: No    Review of Systems  Constitutional:  Negative for fever and chills.  HENT: Negative for congestion and sore throat.   Eyes: Negative for visual disturbance.  Respiratory: Negative for cough and shortness of breath.   Cardiovascular: Negative for chest pain.  Gastrointestinal: Positive for nausea, abdominal pain and diarrhea. Negative for vomiting and blood in stool.  Genitourinary: Positive for dysuria, frequency and hematuria. Negative for flank pain, discharge, penile swelling, scrotal swelling, difficulty urinating, genital sores, penile pain and testicular pain.  Musculoskeletal: Negative for back pain and neck pain.  Skin: Negative for rash.  Neurological: Negative for light-headedness and headaches.      Allergies  Benazepril hcl  Home Medications   Prior to Admission medications   Medication Sig Start Date End Date Taking? Authorizing Provider  acetaminophen (TYLENOL) 500 MG tablet Take 500 mg by mouth every 6 (six) hours as needed for mild pain, moderate pain or headache.   Yes Historical Provider, MD  aspirin EC 81 MG tablet Take 1 tablet (81 mg total) by mouth daily. 02/12/15  Yes Janith Lima, MD  canagliflozin [facility]) 300 MG TABS tablet Take 300 mg by mouth daily before breakfast. 02/12/15  Yes Janith Lima, MD  glucose blood [facility] VERIO) test strip Test up to TID 02/17/15  Yes Janith Lima, MD  Insulin Pen Needle 31G X 5 MM MISC Use with insulin 02/17/15  Yes Janith Lima, MD  Lancets [facility] ULTRASOFT) lancets Test up to TID 02/17/15  Yes Janith Lima, MD  metFORMIN (GLUCOPHAGE) 500 MG  tablet Take 1 tablet (500 mg total) by mouth 2 (two) times daily with a meal. 05/22/15  Yes Janith Lima, MD  naproxen sodium (ANAPROX) 220 MG tablet Take 440-660 mg by mouth every 12 (twelve) hours as needed (pain).   Yes Historical Provider, MD  omeprazole (PRILOSEC OTC) 20 MG tablet Take 20 mg by mouth daily as needed (acid reflux).   Yes Historical Provider, MD  valsartan (DIOVAN) 160 MG tablet Take 1 tablet (160  mg total) by mouth daily. 02/12/15  Yes Janith Lima, MD  cephALEXin (KEFLEX) 500 MG capsule Take 1 capsule (500 mg total) by mouth 4 (four) times daily. 07/29/15   Waynetta Pean, PA-C  phenazopyridine (PYRIDIUM) 100 MG tablet Take 1 tablet (100 mg total) by mouth 3 (three) times daily as needed for pain. 07/29/15   Waynetta Pean, PA-C   BP 134/72 mmHg  Pulse 71  Temp(Src) 98.2 F (36.8 C) (Oral)  Resp 16  SpO2 98% Physical Exam  Constitutional: He appears well-developed and well-nourished. No distress.  Nontoxic appearing. Obese.  HENT:  Head: Normocephalic and atraumatic.  Mouth/Throat: Oropharynx is clear and moist.  Eyes: Conjunctivae are normal. Pupils are equal, round, and reactive to light. Right eye exhibits no discharge. Left eye exhibits no discharge.  Neck: Neck supple.  Cardiovascular: Normal rate, regular rhythm, normal heart sounds and intact distal pulses.  Exam reveals no gallop and no friction rub.   No murmur heard. Pulmonary/Chest: Effort normal and breath sounds normal. No respiratory distress. He has no wheezes. He has no rales.  Abdominal: Soft. Bowel sounds are normal. He exhibits no distension. There is no tenderness. There is no rebound and no guarding.  Abdomen is soft and nontender to palpation. No suprapubic tenderness.  Genitourinary: Testes normal. Right testis shows no mass, no swelling and no tenderness. Left testis shows no mass, no swelling and no tenderness. Uncircumcised. No phimosis, paraphimosis, penile erythema or penile tenderness. No discharge found.  Musculoskeletal: He exhibits no edema.  Lymphadenopathy:    He has no cervical adenopathy.  Neurological: He is alert. Coordination normal.  Skin: Skin is warm and dry. No rash noted. He is not diaphoretic. No erythema. No pallor.  Psychiatric: He has a normal mood and affect. His behavior is normal.  Nursing note and vitals reviewed.   ED Course  Procedures (including critical care time) Labs  Review Labs Reviewed  COMPREHENSIVE METABOLIC PANEL - Abnormal; Notable for the following:    Glucose, Bld 102 (*)    All other components within normal limits  CBC - Abnormal; Notable for the following:    WBC 11.3 (*)    All other components within normal limits  URINALYSIS, ROUTINE W REFLEX MICROSCOPIC (NOT AT [facility]) - Abnormal; Notable for the following:    APPearance CLOUDY (*)    Glucose, UA >1000 (*)    Hgb urine dipstick LARGE (*)    Protein, ur 100 (*)    Leukocytes, UA SMALL (*)    All other components within normal limits  URINE MICROSCOPIC-ADD ON    Imaging Review Ct Renal Stone Study  07/29/2015   CLINICAL DATA:  Pelvic pain with gross hematuria today; no history of stones  EXAM: CT ABDOMEN AND PELVIS WITHOUT CONTRAST  TECHNIQUE: Multidetector CT imaging of the abdomen and pelvis was performed following the standard protocol without IV contrast.  COMPARISON:  None.  FINDINGS: Lower chest:  Normal  Hepatobiliary: Mildly hyper attenuating gallbladder lumen suggests possibility of sludge. Otherwise normal.  Pancreas: Normal  Spleen: Normal  Adrenals/Urinary Tract: 2 mm calcification within or adjacent to the distal left ureter just inside the left acetabulum. This could be a ureteral stone or phlebolith. No hydronephrosis. Right side negative. Other phleboliths are noted bilaterally.  Stomach/Bowel: Evidence of probably chronic constipation as there is fecalized material throughout the nail. There is stool retained throughout the proximal third of the colon.  Vascular/Lymphatic: Negative  Reproductive: Not identified  Other: No ascites  Musculoskeletal: No acute findings  IMPRESSION: In addition to multiple bilateral phleboliths in the pelvis, there is a 2 mm calcification either within or adjacent to the left ureter, possibly representing a tiny ureteral stone versus phlebolith. There is no hydronephrosis.   Electronically Signed   By: Skipper Cliche M.D.   On: 07/29/2015 16:34   I  have personally reviewed and evaluated these images and lab results as part of my medical decision-making.   EKG Interpretation None      Filed Vitals:   07/29/15 1229 07/29/15 1515 07/29/15 1707  BP: 146/87 152/92 134/72  Pulse: 96 87 71  Temp: 97.7 F (36.5 C) 98.2 F (36.8 C) 98.2 F (36.8 C)  TempSrc: Oral Oral Oral  Resp: 18 18 16   SpO2: 97% 97% 98%     MDM   Meds given in ED:  Medications  sodium chloride 0.9 % bolus 1,000 mL (1,000 mLs Intravenous New Bag/Given 07/29/15 1519)  ondansetron (ZOFRAN) injection 4 mg (4 mg Intravenous Given 07/29/15 1518)    Discharge Medication List as of 07/29/2015  5:09 PM    START taking these medications   Details  cephALEXin (KEFLEX) 500 MG capsule Take 1 capsule (500 mg total) by mouth 4 (four) times daily., Starting 07/29/2015, Until Discontinued, Print    phenazopyridine (PYRIDIUM) 100 MG tablet Take 1 tablet (100 mg total) by mouth 3 (three) times daily as needed for pain., Starting 07/29/2015, Until Discontinued, Print        Final diagnoses:  Hematuria   This is a 44 y.o. male with a history of morbid obesity, hypertension, and diabetes who presents to the emergency department complaining of suprapubic abdominal pain, nausea, dysuria and hematuria ongoing since early this morning. Patient reports he started having suprapubic abdominal pain around 5 AM this morning which progressed to having dysuria and hematuria. The patient is on Invokanna. The patient is not a smoker. The patient has history of kidney stones or UTIs. The patient denies any flank pain. On exam the patient is an obese male. He is afebrile and nontoxic-appearing. His abdomen is soft and nontender to palpation. GU exam was unremarkable. Urinalysis shows small leukocytes with too numerous to count red blood cells. There is also a large amount of glucose in his urine due to being on Invokanna. CMP is unremarkable. Glucose is 102. CBC shows a white count of 11.3 and is  otherwise unremarkable. CT renal stone study shows multiple bilateral phleboliths in the pelvis and a possible ureteral stone. There is no hydronephrosis. I doubt ureteral stone. I suspect hemorrhagic cystitis. Still feel the patient needs to follow-up with urology for further workup. We'll discharge the patient prescription for Keflex and peridium. Provided follow-up information with urology. I advised the patient to follow-up with their primary care provider this week. I advised the patient to return to the emergency department with new or worsening symptoms or new concerns. The patient verbalized understanding and agreement with plan.    This patient was discussed with Dr. Maryan Rued who agrees  with assessment and plan.     Waynetta Pean, PA-C 07/29/15 1739  Blanchie Dessert, MD 07/30/15 2122

## 2015-07-29 NOTE — ED Notes (Signed)
Questions concerns r/t dc were denied. Pt is a&ox4 and ambulatory

## 2015-07-29 NOTE — ED Notes (Signed)
Pt complains of lower abdominal pain, diarrhea, dysuria and hematuria since this 5AM this morning. Pt states the pain is worse when urinating. Pt denies flank pain, emesis.

## 2015-08-11 ENCOUNTER — Ambulatory Visit: Payer: BLUE CROSS/BLUE SHIELD | Admitting: Internal Medicine

## 2015-08-15 ENCOUNTER — Ambulatory Visit (INDEPENDENT_AMBULATORY_CARE_PROVIDER_SITE_OTHER): Payer: BLUE CROSS/BLUE SHIELD | Admitting: Internal Medicine

## 2015-08-15 ENCOUNTER — Other Ambulatory Visit (INDEPENDENT_AMBULATORY_CARE_PROVIDER_SITE_OTHER): Payer: BLUE CROSS/BLUE SHIELD

## 2015-08-15 ENCOUNTER — Encounter: Payer: Self-pay | Admitting: Internal Medicine

## 2015-08-15 VITALS — BP 138/86 | HR 84 | Temp 98.1°F | Resp 16 | Ht 70.0 in | Wt 386.0 lb

## 2015-08-15 DIAGNOSIS — N200 Calculus of kidney: Secondary | ICD-10-CM | POA: Diagnosis not present

## 2015-08-15 DIAGNOSIS — R300 Dysuria: Secondary | ICD-10-CM | POA: Diagnosis not present

## 2015-08-15 DIAGNOSIS — R3129 Other microscopic hematuria: Secondary | ICD-10-CM

## 2015-08-15 DIAGNOSIS — I10 Essential (primary) hypertension: Secondary | ICD-10-CM | POA: Diagnosis not present

## 2015-08-15 DIAGNOSIS — R312 Other microscopic hematuria: Secondary | ICD-10-CM | POA: Diagnosis not present

## 2015-08-15 DIAGNOSIS — N410 Acute prostatitis: Secondary | ICD-10-CM

## 2015-08-15 LAB — CBC WITH DIFFERENTIAL/PLATELET
BASOS PCT: 0.4 % (ref 0.0–3.0)
Basophils Absolute: 0 10*3/uL (ref 0.0–0.1)
EOS ABS: 0.2 10*3/uL (ref 0.0–0.7)
EOS PCT: 1.6 % (ref 0.0–5.0)
HCT: 49 % (ref 39.0–52.0)
HEMOGLOBIN: 16.1 g/dL (ref 13.0–17.0)
LYMPHS ABS: 1.8 10*3/uL (ref 0.7–4.0)
Lymphocytes Relative: 18.7 % (ref 12.0–46.0)
MCHC: 32.9 g/dL (ref 30.0–36.0)
MCV: 87.4 fl (ref 78.0–100.0)
MONO ABS: 0.7 10*3/uL (ref 0.1–1.0)
Monocytes Relative: 7 % (ref 3.0–12.0)
NEUTROS PCT: 72.3 % (ref 43.0–77.0)
Neutro Abs: 7 10*3/uL (ref 1.4–7.7)
Platelets: 291 10*3/uL (ref 150.0–400.0)
RBC: 5.61 Mil/uL (ref 4.22–5.81)
RDW: 16.4 % — AB (ref 11.5–15.5)
WBC: 9.7 10*3/uL (ref 4.0–10.5)

## 2015-08-15 LAB — URINALYSIS, ROUTINE W REFLEX MICROSCOPIC

## 2015-08-15 LAB — BASIC METABOLIC PANEL
BUN: 29 mg/dL — AB (ref 6–23)
CO2: 28 mEq/L (ref 19–32)
CREATININE: 1.12 mg/dL (ref 0.40–1.50)
Calcium: 9.4 mg/dL (ref 8.4–10.5)
Chloride: 104 mEq/L (ref 96–112)
GFR: 75.65 mL/min (ref >60.00)
GLUCOSE: 128 mg/dL — AB (ref 70–99)
POTASSIUM: 4.3 meq/L (ref 3.5–5.1)
Sodium: 141 mEq/L (ref 135–145)

## 2015-08-15 MED ORDER — CIPROFLOXACIN HCL 500 MG PO TABS: 500.0000 mg | ORAL_TABLET | Freq: Two times a day (BID) | ORAL | Status: DC

## 2015-08-15 NOTE — Progress Notes (Signed)
Subjective:  Patient ID: Joseph Wood, male    DOB: December 24, 1970  Age: 44 y.o. MRN: [id number]  CC: Dysuria   HPI [name] presents for follow-up after recent a trip to the emergency room for hematuria. He complains of a three-week history of left flank pain, dysuria and hematuria. He was seen in the emergency room and found to have a very small stone in his left ureter. He was placed on Keflex but is not getting any better.  Outpatient Prescriptions Prior to Visit  Medication Sig Dispense Refill  . acetaminophen (TYLENOL) 500 MG tablet Take 500 mg by mouth every 6 (six) hours as needed for mild pain, moderate pain or headache.    Marland Kitchen aspirin EC 81 MG tablet Take 1 tablet (81 mg total) by mouth daily. 90 tablet 3  . canagliflozin (INVOKANA) 300 MG TABS tablet Take 300 mg by mouth daily before breakfast. 90 tablet 3  . glucose blood (ONETOUCH VERIO) test strip Test up to TID 100 each 12  . Insulin Pen Needle 31G X 5 MM MISC Use with insulin 100 each 3  . Lancets (ONETOUCH ULTRASOFT) lancets Test up to TID 100 each 12  . metFORMIN (GLUCOPHAGE) 500 MG tablet Take 1 tablet (500 mg total) by mouth 2 (two) times daily with a meal. 60 tablet 5  . naproxen sodium (ANAPROX) 220 MG tablet Take 440-660 mg by mouth every 12 (twelve) hours as needed (pain).    Marland Kitchen omeprazole (PRILOSEC OTC) 20 MG tablet Take 20 mg by mouth daily as needed (acid reflux).    . phenazopyridine (PYRIDIUM) 100 MG tablet Take 1 tablet (100 mg total) by mouth 3 (three) times daily as needed for pain. 30 tablet 0  . valsartan (DIOVAN) 160 MG tablet Take 1 tablet (160 mg total) by mouth daily. 90 tablet 3  . cephALEXin (KEFLEX) 500 MG capsule Take 1 capsule (500 mg total) by mouth 4 (four) times daily. 40 capsule 0   No facility-administered medications prior to visit.    ROS Review of Systems  Constitutional: Negative.  Negative for fever, chills, diaphoresis, appetite change and fatigue.  HENT: Negative.   Eyes:  Negative.   Respiratory: Negative.   Cardiovascular: Negative.  Negative for chest pain and leg swelling.  Gastrointestinal: Negative.  Negative for nausea, vomiting, abdominal pain, diarrhea, constipation and blood in stool.  Endocrine: Negative.   Genitourinary: Positive for dysuria, hematuria and flank pain. Negative for urgency, frequency, decreased urine volume, discharge, penile swelling, scrotal swelling, enuresis, difficulty urinating, genital sores, penile pain and testicular pain.  Musculoskeletal: Negative.   Skin: Negative.  Negative for rash.  Allergic/Immunologic: Negative.   Neurological: Negative.  Negative for dizziness, weakness and light-headedness.  Hematological: Negative.  Negative for adenopathy. Does not bruise/bleed easily.  Psychiatric/Behavioral: Negative.     Objective:  BP 138/86 mmHg  Pulse 84  Temp(Src) 98.1 F (36.7 C) (Oral)  Ht 5' 10"  (1.778 m)  Wt 386 lb (175.088 kg)  BMI 55.39 kg/m2  SpO2 95%  BP Readings from Last 3 Encounters:  08/15/15 138/86  07/29/15 134/72  06/23/15 114/80    Wt Readings from Last 3 Encounters:  08/15/15 386 lb (175.088 kg)  06/23/15 387 lb 4 oz (175.655 kg)  05/23/15 394 lb 9.6 oz (178.989 kg)    Physical Exam  Constitutional: He is oriented to person, place, and time.  Non-toxic appearance. He does not have a sickly appearance. He does not appear ill. No distress.  HENT:  Head: Normocephalic and atraumatic.  Mouth/Throat: Oropharynx is clear and moist. No oropharyngeal exudate.  Eyes: Conjunctivae are normal. Right eye exhibits no discharge. Left eye exhibits no discharge. No scleral icterus.  Neck: Normal range of motion. Neck supple. No JVD present. No tracheal deviation present. No thyromegaly present.  Cardiovascular: Normal rate, regular rhythm, normal heart sounds and intact distal pulses.  Exam reveals no gallop and no friction rub.   No murmur heard. Pulmonary/Chest: Effort normal and breath sounds  normal. No stridor. No respiratory distress. He has no wheezes. He has no rales. He exhibits no tenderness.  Abdominal: Soft. Bowel sounds are normal. He exhibits no distension and no mass. There is no hepatosplenomegaly, splenomegaly or hepatomegaly. There is no tenderness. There is no rebound, no guarding and no CVA tenderness. No hernia. Hernia confirmed negative in the ventral area, confirmed negative in the right inguinal area and confirmed negative in the left inguinal area.  Genitourinary: Rectum normal, testes normal and penis normal. Rectal exam shows no external hemorrhoid, no internal hemorrhoid, no fissure, no mass, no tenderness and anal tone normal. Guaiac negative stool. Prostate is enlarged (right lobe is enlarged) and tender (right lobe is tender). Right testis shows no mass, no swelling and no tenderness. Right testis is descended. Left testis shows no mass, no swelling and no tenderness. Left testis is descended. Uncircumcised. No phimosis, paraphimosis, hypospadias, penile erythema or penile tenderness. No discharge found.  Musculoskeletal: Normal range of motion. He exhibits no edema or tenderness.  Lymphadenopathy:    He has no cervical adenopathy.       Right: No inguinal adenopathy present.       Left: No inguinal adenopathy present.  Neurological: He is oriented to person, place, and time.  Skin: Skin is warm and dry. No rash noted. He is not diaphoretic. No erythema. No pallor.  Psychiatric: He has a normal mood and affect. His behavior is normal. Judgment and thought content normal.  Vitals reviewed.   Lab Results  Component Value Date   WBC 9.7 08/15/2015   HGB 16.1 08/15/2015   HCT 49.0 08/15/2015   PLT 291.0 08/15/2015   GLUCOSE 128* 08/15/2015   CHOL 166 02/12/2015   TRIG 135.0 02/12/2015   HDL 44.80 02/12/2015   LDLCALC 94 02/12/2015   ALT 24 07/29/2015   AST 23 07/29/2015   NA 141 08/15/2015   K 4.3 08/15/2015   CL 104 08/15/2015   CREATININE 1.12  08/15/2015   BUN 29* 08/15/2015   CO2 28 08/15/2015   TSH 2.03 02/12/2015   PSA 1.01 06/23/2015   HGBA1C 6.0 06/23/2015   MICROALBUR 1.5 02/12/2015    Ct Renal Stone Study  07/29/2015   CLINICAL DATA:  Pelvic pain with gross hematuria today; no history of stones  EXAM: CT ABDOMEN AND PELVIS WITHOUT CONTRAST  TECHNIQUE: Multidetector CT imaging of the abdomen and pelvis was performed following the standard protocol without IV contrast.  COMPARISON:  None.  FINDINGS: Lower chest:  Normal  Hepatobiliary: Mildly hyper attenuating gallbladder lumen suggests possibility of sludge. Otherwise normal.  Pancreas: Normal  Spleen: Normal  Adrenals/Urinary Tract: 2 mm calcification within or adjacent to the distal left ureter just inside the left acetabulum. This could be a ureteral stone or phlebolith. No hydronephrosis. Right side negative. Other phleboliths are noted bilaterally.  Stomach/Bowel: Evidence of probably chronic constipation as there is fecalized material throughout the nail. There is stool retained throughout the proximal third of the colon.  Vascular/Lymphatic: Negative  Reproductive: Not identified  Other: No ascites  Musculoskeletal: No acute findings  IMPRESSION: In addition to multiple bilateral phleboliths in the pelvis, there is a 2 mm calcification either within or adjacent to the left ureter, possibly representing a tiny ureteral stone versus phlebolith. There is no hydronephrosis.   Electronically Signed   By: Skipper Cliche M.D.   On: 07/29/2015 16:34    Assessment & Plan:   [place] was seen today for dysuria.  Diagnoses and all orders for this visit:  Hematuria, microscopic- his urinalysis today is tainted by the presence of Pyridium but they're appear to be several white blood cells. I'm concerned that he has a stone that may be infected so will start ciprofloxacin. Will also screen him for gonorrhea and Chlamydia since he is sexually active. -     CULTURE, URINE COMPREHENSIVE;  Future -     Urinalysis, Routine w reflex microscopic (not at [facility]); Future -     GC/chlamydia probe amp, urine; Future  Dysuria- he appears to have acute prostatitis and a renal stone, will treat with Cipro. Will also check his urine culture and screen for gonorrhea and chlamydia. -     CULTURE, URINE COMPREHENSIVE; Future -     Urinalysis, Routine w reflex microscopic (not at [facility]); Future -     GC/chlamydia probe amp, urine; Future  Renal stone- he was previously referred to urology about this but has not made an appointment, again today encouraged him to see urology. -     Ambulatory referral to Urology -     Basic metabolic panel; Future -     CBC with Differential/Platelet; Future  HYPERTENSION, BENIGN ESSENTIAL- his blood pressure is adequately well controlled, electrolytes and renal function are stable. -     Basic metabolic panel; Future  Acute prostatitis -     ciprofloxacin (CIPRO) 500 MG tablet; Take 1 tablet (500 mg total) by mouth 2 (two) times daily.   I have discontinued Mr. Kozakiewicz cephALEXin. I am also having him start on ciprofloxacin. Additionally, I am having him maintain his aspirin EC, valsartan, canagliflozin, onetouch ultrasoft, glucose blood, Insulin Pen Needle, metFORMIN, omeprazole, naproxen sodium, acetaminophen, and phenazopyridine.  Meds ordered this encounter  Medications  . ciprofloxacin (CIPRO) 500 MG tablet    Sig: Take 1 tablet (500 mg total) by mouth 2 (two) times daily.    Dispense:  60 tablet    Refill:  0     Follow-up: Return in about 4 weeks (around 09/12/2015).  Scarlette Calico, MD

## 2015-08-15 NOTE — Progress Notes (Signed)
Pre visit review using our clinic review tool, if applicable. No additional management support is needed unless otherwise documented below in the visit note. 

## 2015-08-15 NOTE — Patient Instructions (Signed)

## 2015-08-16 LAB — CULTURE, URINE COMPREHENSIVE
COLONY COUNT: NO GROWTH
ORGANISM ID, BACTERIA: NO GROWTH

## 2015-08-20 LAB — GC/CHLAMYDIA PROBE AMP, URINE
CHLAMYDIA, SWAB/URINE, PCR: NEGATIVE
GC PROBE AMP, URINE: NEGATIVE

## 2015-09-10 ENCOUNTER — Encounter: Payer: Self-pay | Admitting: Pulmonary Disease

## 2015-09-10 ENCOUNTER — Ambulatory Visit (INDEPENDENT_AMBULATORY_CARE_PROVIDER_SITE_OTHER): Payer: BLUE CROSS/BLUE SHIELD | Admitting: Pulmonary Disease

## 2015-09-10 VITALS — BP 130/80 | HR 80 | Temp 98.4°F | Ht 70.0 in | Wt 398.0 lb

## 2015-09-10 DIAGNOSIS — G4733 Obstructive sleep apnea (adult) (pediatric): Secondary | ICD-10-CM | POA: Diagnosis not present

## 2015-09-10 NOTE — Progress Notes (Signed)
Chief Complaint  Patient presents with  . Follow-up    Former Joseph Wood pt. pt states he is doing ok. pt using CPAP everynight for about 4- 6 hours. mask and pressure good for pt.  DME: Joseph Wood     History of Present Illness: Joseph Wood is a 44 y.o. male with OSA.  He was previously seen by Dr. Gwenette Greet.  He is doing okay with CPAP.  He has nasal mask and no issue with mask fit.  He is very busy with work and home life.  As a result he can only sleep for about 5 hrs per night.  He uses his CPAP for entire time he is asleep.  He is working with his girlfriend about his diet, and trying to exercise more.   TESTS: HST 05/19/15 >> AHI 17 Auto CPAP 08/10/15 to 09/08/15 >> used on 29 of 30 nights with average 5 hrs and 3 min.  Average AHI is 3.6 with median CPAP 7 cm H2O and 95 th percentile CPAP 10 cm H20.   PMhx >> HTN, DM  Past surgical hx, Medications, Allergies, Family hx, Social hx all reviewed.   Physical Exam: BP 130/80 mmHg  Pulse 80  Temp(Src) 98.4 F (36.9 C) (Oral)  Ht 5' 10"  (1.778 m)  Wt 398 lb (180.532 kg)  BMI 57.11 kg/m2  SpO2 95%  General - No distress ENT - No sinus tenderness, no oral exudate, no LAN Cardiac - s1s2 regular, no murmur Chest - No wheeze/rales/dullness Back - No focal tenderness Abd - Soft, non-tender Ext - No edema Neuro - Normal strength Skin - No rashes Psych - normal mood, and behavior   Assessment/Plan:  Obstructive sleep apnea. He is compliant with CPAP and reports benefit. Plan: - continue auto CPAP  Morbid obesity. Plan: - reviewed options to assist with weight loss  Insufficient sleep. Plan: - discussed importance of allowing enough time for sleep   Joseph Mires, MD Joseph Wood Pulmonary/Critical Care/Sleep Pager:  [phone]

## 2015-09-10 NOTE — Patient Instructions (Signed)
Follow up in 1 year.

## 2015-09-22 ENCOUNTER — Encounter: Payer: Self-pay | Admitting: Internal Medicine

## 2015-09-22 ENCOUNTER — Ambulatory Visit (INDEPENDENT_AMBULATORY_CARE_PROVIDER_SITE_OTHER): Payer: BLUE CROSS/BLUE SHIELD | Admitting: Internal Medicine

## 2015-09-22 VITALS — BP 128/88 | HR 66 | Temp 98.2°F | Resp 16 | Ht 70.0 in | Wt 395.0 lb

## 2015-09-22 DIAGNOSIS — E118 Type 2 diabetes mellitus with unspecified complications: Secondary | ICD-10-CM | POA: Diagnosis not present

## 2015-09-22 DIAGNOSIS — M722 Plantar fascial fibromatosis: Secondary | ICD-10-CM

## 2015-09-22 DIAGNOSIS — I10 Essential (primary) hypertension: Secondary | ICD-10-CM

## 2015-09-22 DIAGNOSIS — Z794 Long term (current) use of insulin: Secondary | ICD-10-CM

## 2015-09-22 DIAGNOSIS — K219 Gastro-esophageal reflux disease without esophagitis: Secondary | ICD-10-CM

## 2015-09-22 MED ORDER — LORCASERIN HCL 10 MG PO TABS: 1.0000 | ORAL_TABLET | Freq: Two times a day (BID) | ORAL | Status: DC

## 2015-09-22 MED ORDER — OMEPRAZOLE 20 MG PO CPDR: 20.0000 mg | DELAYED_RELEASE_CAPSULE | Freq: Every day | ORAL | Status: DC

## 2015-09-22 NOTE — Patient Instructions (Signed)
Obesity Obesity is defined as having too much total body fat and a body mass index (BMI) of 30 or more. BMI is an estimate of body fat and is calculated from your height and weight. BMI is typically calculated by your health care provider during regular wellness visits. Obesity happens when you consume more calories than you can burn by exercising or performing daily physical tasks. Prolonged obesity can cause major illnesses or emergencies, such as:  Stroke.  Heart disease.  Diabetes.  Cancer.  Arthritis.  High blood pressure (hypertension).  High cholesterol.  Sleep apnea.  Erectile dysfunction.  Infertility problems. CAUSES   Regularly eating unhealthy foods.  Physical inactivity.  Certain disorders, such as an underactive thyroid (hypothyroidism), Cushing's syndrome, and polycystic ovarian syndrome.  Certain medicines, such as steroids, some depression medicines, and antipsychotics.  Genetics.  Lack of sleep. DIAGNOSIS A health care provider can diagnose obesity after calculating your BMI. Obesity will be diagnosed if your BMI is 30 or higher. There are other methods of measuring obesity levels. Some other methods include measuring your skinfold thickness, your waist circumference, and comparing your hip circumference to your waist circumference. TREATMENT  A healthy treatment program includes some or all of the following:  Long-term dietary changes.  Exercise and physical activity.  Behavioral and lifestyle changes.  Medicine only under the supervision of your health care provider. Medicines may help, but only if they are used with diet and exercise programs. If your BMI is 40 or higher, your health care provider may recommend specialized surgery or programs to help with weight loss. An unhealthy treatment program includes:  Fasting.  Fad diets.  Supplements and drugs. These choices do not succeed in long-term weight control. HOME CARE  INSTRUCTIONS  Exercise and perform physical activity as directed by your health care provider. To increase physical activity, try the following:  Use stairs instead of elevators.  Park farther away from store entrances.  Garden, bike, or walk instead of watching television or using the computer.  Eat healthy, low-calorie foods and drinks on a regular basis. Eat more fruits and vegetables. Use low-calorie cookbooks or take healthy cooking classes.  Limit fast food, sweets, and processed snack foods.  Eat smaller portions.  Keep a daily journal of everything you eat. There are many free websites to help you with this. It may be helpful to measure your foods so you can determine if you are eating the correct portion sizes.  Avoid drinking alcohol. Drink more water and drinks without calories.  Take vitamins and supplements only as recommended by your health care provider.  Weight-loss support groups, Tax adviser, counselors, and stress reduction education can also be very helpful. SEEK IMMEDIATE MEDICAL CARE IF:  You have chest pain or tightness.  You have trouble breathing or feel short of breath.  You have weakness or leg numbness.  You feel confused or have trouble talking.  You have sudden changes in your vision.   This information is not intended to replace advice given to you by your health care provider. Make sure you discuss any questions you have with your health care provider.   Document Released: 01/06/2005 Document Revised: 12/20/2014 Document Reviewed: 01/05/2012 Elsevier Interactive Patient Education [employer].

## 2015-09-22 NOTE — Progress Notes (Signed)
Subjective:  Patient ID: Joseph Wood, male    DOB: [birth year]  Age: 44 y.o. MRN: [id number]  CC: Gastrophageal Reflux   HPI Joseph Wood presents for f/up after a recent prostate infection, the urinary s/s have resolved. Today he complains of foot pain and he requests a medication to help him lose weight.  Outpatient Prescriptions Prior to Visit  Medication Sig Dispense Refill  . acetaminophen (TYLENOL) 500 MG tablet Take 500 mg by mouth every 6 (six) hours as needed for mild pain, moderate pain or headache.    Marland Kitchen aspirin EC 81 MG tablet Take 1 tablet (81 mg total) by mouth daily. 90 tablet 3  . canagliflozin (INVOKANA) 300 MG TABS tablet Take 300 mg by mouth daily before breakfast. 90 tablet 3  . glucose blood (ONETOUCH VERIO) test strip Test up to TID 100 each 12  . Insulin Pen Needle 31G X 5 MM MISC Use with insulin 100 each 3  . Lancets (ONETOUCH ULTRASOFT) lancets Test up to TID 100 each 12  . metFORMIN (GLUCOPHAGE) 500 MG tablet Take 1 tablet (500 mg total) by mouth 2 (two) times daily with a meal. 60 tablet 5  . naproxen sodium (ANAPROX) 220 MG tablet Take 440-660 mg by mouth every 12 (twelve) hours as needed (pain).    . valsartan (DIOVAN) 160 MG tablet Take 1 tablet (160 mg total) by mouth daily. 90 tablet 3  . cephALEXin (KEFLEX) 500 MG capsule Take 2 capsules by mouth daily.  0  . ciprofloxacin (CIPRO) 500 MG tablet Take 1 tablet (500 mg total) by mouth 2 (two) times daily. 60 tablet 0  . omeprazole (PRILOSEC OTC) 20 MG tablet Take 20 mg by mouth daily as needed (acid reflux).    . phenazopyridine (PYRIDIUM) 100 MG tablet Take 1 tablet (100 mg total) by mouth 3 (three) times daily as needed for pain. 30 tablet 0   No facility-administered medications prior to visit.    ROS Review of Systems  Constitutional: Positive for unexpected weight change (wt gain). Negative for fever, chills, diaphoresis, appetite change and fatigue.  HENT: Negative.   Eyes: Negative for  pain.  Respiratory: Positive for apnea. Negative for cough, choking, chest tightness, shortness of breath, wheezing and stridor.   Cardiovascular: Negative.  Negative for chest pain, palpitations and leg swelling.  Gastrointestinal: Negative.  Negative for nausea, vomiting, abdominal pain, diarrhea, constipation and blood in stool.       +heartburn  Endocrine: Negative.  Negative for polydipsia, polyphagia and polyuria.  Genitourinary: Negative.  Negative for dysuria, urgency, frequency, hematuria, flank pain, decreased urine volume, enuresis and difficulty urinating.  Musculoskeletal: Positive for arthralgias. Negative for myalgias, back pain, joint swelling and neck pain.       He has had bilateral foot pain for about one month, L>>>>R, the pain is described as sharp, stinging, needle sticks that occurs with activity. He denies N/T in his feet.  Skin: Negative.  Negative for rash.  Allergic/Immunologic: Negative.   Neurological: Negative.   Hematological: Negative.  Negative for adenopathy. Does not bruise/bleed easily.  Psychiatric/Behavioral: Negative.     Objective:  BP 128/88 mmHg  Pulse 66  Temp(Src) 98.2 F (36.8 C) (Oral)  Ht 5' 10"  (1.778 m)  Wt 395 lb (179.171 kg)  BMI 56.68 kg/m2  SpO2 98%  BP Readings from Last 3 Encounters:  09/22/15 128/88  09/10/15 130/80  08/15/15 138/86    Wt Readings from Last 3 Encounters:  09/22/15 395 lb (  179.171 kg)  09/10/15 398 lb (180.532 kg)  08/15/15 386 lb (175.088 kg)    Physical Exam  Constitutional: He is oriented to person, place, and time. No distress.  HENT:  Mouth/Throat: Oropharynx is clear and moist. No oropharyngeal exudate.  Eyes: Conjunctivae are normal. Right eye exhibits no discharge. Left eye exhibits no discharge. No scleral icterus.  Neck: Normal range of motion. Neck supple. No JVD present. No tracheal deviation present. No thyromegaly present.  Cardiovascular: Normal rate, regular rhythm, normal heart sounds  and intact distal pulses.  Exam reveals no gallop and no friction rub.   No murmur heard. Pulmonary/Chest: Effort normal and breath sounds normal. No stridor. No respiratory distress. He has no wheezes. He has no rales. He exhibits no tenderness.  Abdominal: Soft. Bowel sounds are normal. He exhibits no distension and no mass. There is no tenderness. There is no rebound and no guarding.  Musculoskeletal: Normal range of motion. He exhibits no edema or tenderness.       Right foot: Normal. There is normal range of motion, no tenderness, no bony tenderness, no swelling, normal capillary refill, no crepitus, no deformity and no laceration.       Left foot: Normal. There is normal range of motion, no tenderness, no bony tenderness, no swelling, normal capillary refill, no crepitus, no deformity and no laceration.  Lymphadenopathy:    He has no cervical adenopathy.  Neurological: He is oriented to person, place, and time.  Skin: Skin is warm and dry. No rash noted. He is not diaphoretic. No erythema. No pallor.  Psychiatric: He has a normal mood and affect. His behavior is normal. Judgment and thought content normal.  Vitals reviewed.   Lab Results  Component Value Date   WBC 9.7 08/15/2015   HGB 16.1 08/15/2015   HCT 49.0 08/15/2015   PLT 291.0 08/15/2015   GLUCOSE 128* 08/15/2015   CHOL 166 02/12/2015   TRIG 135.0 02/12/2015   HDL 44.80 02/12/2015   LDLCALC 94 02/12/2015   ALT 24 07/29/2015   AST 23 07/29/2015   NA 141 08/15/2015   K 4.3 08/15/2015   CL 104 08/15/2015   CREATININE 1.12 08/15/2015   BUN 29* 08/15/2015   CO2 28 08/15/2015   TSH 2.03 02/12/2015   PSA 1.01 06/23/2015   HGBA1C 6.0 06/23/2015   MICROALBUR 1.5 02/12/2015    Ct Renal Stone Study  07/29/2015   CLINICAL DATA:  Pelvic pain with gross hematuria today; no history of stones  EXAM: CT ABDOMEN AND PELVIS WITHOUT CONTRAST  TECHNIQUE: Multidetector CT imaging of the abdomen and pelvis was performed following the  standard protocol without IV contrast.  COMPARISON:  None.  FINDINGS: Lower chest:  Normal  Hepatobiliary: Mildly hyper attenuating gallbladder lumen suggests possibility of sludge. Otherwise normal.  Pancreas: Normal  Spleen: Normal  Adrenals/Urinary Tract: 2 mm calcification within or adjacent to the distal left ureter just inside the left acetabulum. This could be a ureteral stone or phlebolith. No hydronephrosis. Right side negative. Other phleboliths are noted bilaterally.  Stomach/Bowel: Evidence of probably chronic constipation as there is fecalized material throughout the nail. There is stool retained throughout the proximal third of the colon.  Vascular/Lymphatic: Negative  Reproductive: Not identified  Other: No ascites  Musculoskeletal: No acute findings  IMPRESSION: In addition to multiple bilateral phleboliths in the pelvis, there is a 2 mm calcification either within or adjacent to the left ureter, possibly representing a tiny ureteral stone versus phlebolith. There is no  hydronephrosis.   Electronically Signed   By: Skipper Cliche M.D.   On: 07/29/2015 16:34    Assessment & Plan:   Kayla was seen today for gastrophageal reflux.  Diagnoses and all orders for this visit:  HYPERTENSION, BENIGN ESSENTIAL- his BP is well controlled  Type 2 diabetes mellitus with complication, with long-term current use of insulin ([place])- his blood sugars are well controlled  Obesity, morbid, BMI 50 or higher ([place])- in addition to lifestyle modifications, will start belviq -     Lorcaserin HCl (BELVIQ) 10 MG TABS; Take 1 tablet by mouth 2 (two) times daily.  Plantar fasciitis, bilateral- podiatry referral -     Ambulatory referral to Podiatry  Gastroesophageal reflux disease without esophagitis -     omeprazole (PRILOSEC) 20 MG capsule; Take 1 capsule (20 mg total) by mouth daily.  I have discontinued Mr. Mollenkopf omeprazole, phenazopyridine, ciprofloxacin, and cephALEXin. I have also changed his  omeprazole. Additionally, I am having him start on Lorcaserin HCl. Lastly, I am having him maintain his aspirin EC, valsartan, canagliflozin, onetouch ultrasoft, glucose blood, Insulin Pen Needle, metFORMIN, naproxen sodium, and acetaminophen.  Meds ordered this encounter  Medications  . DISCONTD: omeprazole (PRILOSEC) 20 MG capsule    Sig: Take 20 mg by mouth daily.  Marland Kitchen omeprazole (PRILOSEC) 20 MG capsule    Sig: Take 1 capsule (20 mg total) by mouth daily.    Dispense:  90 capsule    Refill:  3  . Lorcaserin HCl (BELVIQ) 10 MG TABS    Sig: Take 1 tablet by mouth 2 (two) times daily.    Dispense:  60 tablet    Refill:  5     Follow-up: Return in about 2 months (around 11/22/2015).  Scarlette Calico, MD

## 2015-09-22 NOTE — Progress Notes (Signed)
Pre visit review using our clinic review tool, if applicable. No additional management support is needed unless otherwise documented below in the visit note. 

## 2015-09-29 ENCOUNTER — Ambulatory Visit (INDEPENDENT_AMBULATORY_CARE_PROVIDER_SITE_OTHER): Payer: BLUE CROSS/BLUE SHIELD

## 2015-09-29 ENCOUNTER — Ambulatory Visit (INDEPENDENT_AMBULATORY_CARE_PROVIDER_SITE_OTHER): Payer: BLUE CROSS/BLUE SHIELD | Admitting: Podiatry

## 2015-09-29 ENCOUNTER — Encounter: Payer: Self-pay | Admitting: Podiatry

## 2015-09-29 VITALS — BP 130/84 | HR 80 | Resp 16 | Ht 70.0 in | Wt 390.0 lb

## 2015-09-29 DIAGNOSIS — M79672 Pain in left foot: Secondary | ICD-10-CM | POA: Diagnosis not present

## 2015-09-29 DIAGNOSIS — M722 Plantar fascial fibromatosis: Secondary | ICD-10-CM | POA: Diagnosis not present

## 2015-09-29 DIAGNOSIS — M779 Enthesopathy, unspecified: Secondary | ICD-10-CM | POA: Diagnosis not present

## 2015-09-29 DIAGNOSIS — M79671 Pain in right foot: Secondary | ICD-10-CM

## 2015-09-29 MED ORDER — TRIAMCINOLONE ACETONIDE 10 MG/ML IJ SUSP
10.0000 mg | Freq: Once | INTRAMUSCULAR | Status: AC
  Administered 2015-09-29: 10 mg

## 2015-09-29 NOTE — Progress Notes (Signed)
   Subjective:    Patient ID: Joseph Wood, male    DOB: July 18, 1971, 44 y.o.   MRN: [id number]  HPI Patient presents with bilateral foot pain; toes-base on top of feet; on Left foot-lateral side. This has been going on for the past 5-6 months. Pt diabetic, has not checked sugar level today; A1C=6.   Review of Systems  All other systems reviewed and are negative.      Objective:   Physical Exam        Assessment & Plan:

## 2015-09-30 NOTE — Progress Notes (Signed)
Subjective:     Patient ID: Joseph Wood, male   DOB: 1971/05/10, 44 y.o.   MRN: [id number]  HPI patient presents with long-term diabetes with moderate flatfoot deformity and lifting of the lesser digits. Patient states that this is been ongoing and making it hard for him to walk comfortably. States the joints of the feet really bother him and he does have diabetes that is under reasonable control but does also experience significant obesity with inflammation especially around the third and fourth joints of the left over right foot   Review of Systems  All other systems reviewed and are negative.      Objective:   Physical Exam  Constitutional: He is oriented to person, place, and time.  Cardiovascular: Intact distal pulses.   Musculoskeletal: Normal range of motion.  Neurological: He is oriented to person, place, and time.  Skin: Skin is warm and dry.  Nursing note and vitals reviewed.  neurovascular status was found to be intact with muscle strength adequate range of motion within normal limits. Equinus condition noted bilateral with significant flatfoot deformity noted bilateral with discomfort that's prominent in the fascial band mostly in the metatarsophalangeal joint left over right third and fourth joints being the most involved. Patient was found to have good digital perfusion     Assessment:     Significant foot structural problems both feet with inflammatory capsulitis and low grade fasciitis symptoms    Plan:     H&P and x-rays and conditions reviewed with patient. At this point I went ahead did a careful capsular injection of the third MPJ left and applied padding and scanned for Berkley type orthotic to provide heel stabilization with deep heel cup arch support and reduction of pressure off the MPJs. We did this with plaster casting and he will reappoint when they are ready or earlier if any issues should occur

## 2015-10-27 ENCOUNTER — Ambulatory Visit (INDEPENDENT_AMBULATORY_CARE_PROVIDER_SITE_OTHER): Payer: BLUE CROSS/BLUE SHIELD | Admitting: Internal Medicine

## 2015-10-27 ENCOUNTER — Encounter: Payer: Self-pay | Admitting: Internal Medicine

## 2015-10-27 ENCOUNTER — Other Ambulatory Visit (INDEPENDENT_AMBULATORY_CARE_PROVIDER_SITE_OTHER): Payer: BLUE CROSS/BLUE SHIELD

## 2015-10-27 VITALS — BP 122/82 | HR 76 | Temp 97.9°F | Resp 16 | Ht 70.0 in | Wt 391.0 lb

## 2015-10-27 DIAGNOSIS — L918 Other hypertrophic disorders of the skin: Secondary | ICD-10-CM | POA: Diagnosis not present

## 2015-10-27 DIAGNOSIS — I10 Essential (primary) hypertension: Secondary | ICD-10-CM | POA: Diagnosis not present

## 2015-10-27 DIAGNOSIS — E118 Type 2 diabetes mellitus with unspecified complications: Secondary | ICD-10-CM | POA: Diagnosis not present

## 2015-10-27 LAB — BASIC METABOLIC PANEL
BUN: 22 mg/dL (ref 6–23)
CHLORIDE: 104 meq/L (ref 96–112)
CO2: 30 meq/L (ref 19–32)
Calcium: 9.6 mg/dL (ref 8.4–10.5)
Creatinine, Ser: 0.89 mg/dL (ref 0.40–1.50)
GFR: 98.53 mL/min (ref >60.00)
Glucose, Bld: 108 mg/dL — ABNORMAL HIGH (ref 70–99)
POTASSIUM: 3.9 meq/L (ref 3.5–5.1)
SODIUM: 141 meq/L (ref 135–145)

## 2015-10-27 LAB — HEMOGLOBIN A1C: HEMOGLOBIN A1C: 6.3 % (ref 4.6–6.5)

## 2015-10-27 NOTE — Progress Notes (Signed)
Pre visit review using our clinic review tool, if applicable. No additional management support is needed unless otherwise documented below in the visit note. 

## 2015-10-27 NOTE — Patient Instructions (Signed)

## 2015-10-27 NOTE — Progress Notes (Signed)
Subjective:  Patient ID: Joseph Wood, male    DOB: [birth year]  Age: 44 y.o. MRN: [id number]  CC: Hypertension and Diabetes   HPI Joseph Wood presents for a follow-up on hypertension and diabetes.  He is concerned about some skin tags around his neck and he wants to see a dermatologist to have them removed. He has been taking Belviq and though it doesn't cause any side effects he also doesn't think his lost much weight.  Outpatient Prescriptions Prior to Visit  Medication Sig Dispense Refill  . acetaminophen (TYLENOL) 500 MG tablet Take 500 mg by mouth every 6 (six) hours as needed for mild pain, moderate pain or headache.    Marland Kitchen aspirin EC 81 MG tablet Take 1 tablet (81 mg total) by mouth daily. 90 tablet 3  . canagliflozin (INVOKANA) 300 MG TABS tablet Take 300 mg by mouth daily before breakfast. 90 tablet 3  . glucose blood (ONETOUCH VERIO) test strip Test up to TID 100 each 12  . Lancets (ONETOUCH ULTRASOFT) lancets Test up to TID 100 each 12  . Lorcaserin HCl (BELVIQ) 10 MG TABS Take 1 tablet by mouth 2 (two) times daily. 60 tablet 5  . metFORMIN (GLUCOPHAGE) 500 MG tablet Take 1 tablet (500 mg total) by mouth 2 (two) times daily with a meal. 60 tablet 5  . naproxen sodium (ANAPROX) 220 MG tablet Take 440-660 mg by mouth every 12 (twelve) hours as needed (pain).    Marland Kitchen omeprazole (PRILOSEC) 20 MG capsule Take 1 capsule (20 mg total) by mouth daily. 90 capsule 3  . valsartan (DIOVAN) 160 MG tablet Take 1 tablet (160 mg total) by mouth daily. 90 tablet 3   No facility-administered medications prior to visit.    ROS Review of Systems  Constitutional: Negative.  Negative for fever, chills, diaphoresis, activity change, appetite change, fatigue and unexpected weight change.  HENT: Negative.   Eyes: Negative.   Respiratory: Negative.  Negative for cough, choking, chest tightness, shortness of breath and stridor.   Cardiovascular: Negative.  Negative for chest pain,  palpitations and leg swelling.  Gastrointestinal: Negative.  Negative for nausea, vomiting, abdominal pain, diarrhea, constipation and blood in stool.  Endocrine: Negative.  Negative for polydipsia, polyphagia and polyuria.  Genitourinary: Negative.  Negative for dysuria.  Musculoskeletal: Negative.  Negative for myalgias, back pain, joint swelling and arthralgias.  Skin: Negative.  Negative for rash.  Allergic/Immunologic: Negative.   Neurological: Negative.  Negative for dizziness, tremors, syncope and numbness.  Hematological: Negative.  Negative for adenopathy. Does not bruise/bleed easily.  Psychiatric/Behavioral: Negative.     Objective:  BP 122/82 mmHg  Pulse 76  Temp(Src) 97.9 F (36.6 C) (Oral)  Resp 16  Ht 5' 10"  (1.778 m)  Wt 391 lb (177.356 kg)  BMI 56.10 kg/m2  SpO2 96%  BP Readings from Last 3 Encounters:  10/27/15 122/82  09/29/15 130/84  09/22/15 128/88    Wt Readings from Last 3 Encounters:  10/27/15 391 lb (177.356 kg)  09/29/15 390 lb (176.903 kg)  09/22/15 395 lb (179.171 kg)    Physical Exam  Constitutional: He is oriented to person, place, and time. No distress.  HENT:  Head: Normocephalic and atraumatic.  Mouth/Throat: Oropharynx is clear and moist. No oropharyngeal exudate.  Eyes: Conjunctivae are normal. Right eye exhibits no discharge. Left eye exhibits no discharge. No scleral icterus.  Neck: Normal range of motion. Neck supple. No JVD present. No tracheal deviation present. No thyromegaly present.  Cardiovascular: Normal  rate, regular rhythm, normal heart sounds and intact distal pulses.  Exam reveals no gallop and no friction rub.   No murmur heard. Pulmonary/Chest: Effort normal and breath sounds normal. No stridor. No respiratory distress. He has no wheezes. He has no rales. He exhibits no tenderness.  Abdominal: Soft. Bowel sounds are normal. He exhibits no distension and no mass. There is no tenderness. There is no rebound and no guarding.   Musculoskeletal: Normal range of motion. He exhibits no edema or tenderness.  Lymphadenopathy:    He has no cervical adenopathy.  Neurological: He is oriented to person, place, and time.  Skin: Skin is warm and dry. No rash noted. He is not diaphoretic. No erythema. No pallor.   There is skin tags around his neck  Vitals reviewed.   Lab Results  Component Value Date   WBC 9.7 08/15/2015   HGB 16.1 08/15/2015   HCT 49.0 08/15/2015   PLT 291.0 08/15/2015   GLUCOSE 108* 10/27/2015   CHOL 166 02/12/2015   TRIG 135.0 02/12/2015   HDL 44.80 02/12/2015   LDLCALC 94 02/12/2015   ALT 24 07/29/2015   AST 23 07/29/2015   NA 141 10/27/2015   K 3.9 10/27/2015   CL 104 10/27/2015   CREATININE 0.89 10/27/2015   BUN 22 10/27/2015   CO2 30 10/27/2015   TSH 2.03 02/12/2015   PSA 1.01 06/23/2015   HGBA1C 6.3 10/27/2015   MICROALBUR 1.5 02/12/2015    Ct Renal Stone Study  07/29/2015  CLINICAL DATA:  Pelvic pain with gross hematuria today; no history of stones EXAM: CT ABDOMEN AND PELVIS WITHOUT CONTRAST TECHNIQUE: Multidetector CT imaging of the abdomen and pelvis was performed following the standard protocol without IV contrast. COMPARISON:  None. FINDINGS: Lower chest:  Normal Hepatobiliary: Mildly hyper attenuating gallbladder lumen suggests possibility of sludge. Otherwise normal. Pancreas: Normal Spleen: Normal Adrenals/Urinary Tract: 2 mm calcification within or adjacent to the distal left ureter just inside the left acetabulum. This could be a ureteral stone or phlebolith. No hydronephrosis. Right side negative. Other phleboliths are noted bilaterally. Stomach/Bowel: Evidence of probably chronic constipation as there is fecalized material throughout the nail. There is stool retained throughout the proximal third of the colon. Vascular/Lymphatic: Negative Reproductive: Not identified Other: No ascites Musculoskeletal: No acute findings IMPRESSION: In addition to multiple bilateral phleboliths  in the pelvis, there is a 2 mm calcification either within or adjacent to the left ureter, possibly representing a tiny ureteral stone versus phlebolith. There is no hydronephrosis. Electronically Signed   By: Skipper Cliche M.D.   On: 07/29/2015 16:34    Assessment & Plan:   Rollyn was seen today for hypertension and diabetes.  Diagnoses and all orders for this visit:  Cutaneous skin tags -     Ambulatory referral to Dermatology  HYPERTENSION, BENIGN ESSENTIAL-  His blood pressure is well-controlled, lytes and renal function are stable. -     Basic metabolic panel; Future  Type 2 diabetes mellitus with complication, without long-term current use of insulin ([place])-  His blood sugars are well-controlled, will continue canagliflozin and metformin. -     Basic metabolic panel; Future -     Hemoglobin A1c; Future   I am having Joseph Wood maintain his aspirin EC, valsartan, canagliflozin, onetouch ultrasoft, glucose blood, metFORMIN, naproxen sodium, acetaminophen, omeprazole, and Lorcaserin HCl.  No orders of the defined types were placed in this encounter.     Follow-up: Return in about 4 years (around 10/27/2019).  Marcello Moores  Ronnald Ramp, MD

## 2015-10-31 ENCOUNTER — Ambulatory Visit: Payer: BLUE CROSS/BLUE SHIELD | Admitting: *Deleted

## 2015-10-31 DIAGNOSIS — M779 Enthesopathy, unspecified: Secondary | ICD-10-CM

## 2015-10-31 NOTE — Progress Notes (Signed)
Patient ID: Joseph Wood, male   DOB: December 02, 1971, 44 y.o.   MRN: [id number] Patient presents for orthotic pick up.  Verbal and written break in and wear instructions given.  Patient will follow up in 4 weeks if symptoms worsen or fail to improve.

## 2015-10-31 NOTE — Patient Instructions (Signed)

## 2015-11-24 ENCOUNTER — Ambulatory Visit: Payer: BLUE CROSS/BLUE SHIELD | Admitting: Internal Medicine

## 2015-11-27 ENCOUNTER — Ambulatory Visit: Payer: BLUE CROSS/BLUE SHIELD | Admitting: Podiatry

## 2015-12-02 ENCOUNTER — Ambulatory Visit: Payer: BLUE CROSS/BLUE SHIELD | Admitting: Internal Medicine

## 2015-12-10 ENCOUNTER — Encounter: Payer: Self-pay | Admitting: Internal Medicine

## 2015-12-10 ENCOUNTER — Ambulatory Visit (INDEPENDENT_AMBULATORY_CARE_PROVIDER_SITE_OTHER): Payer: BLUE CROSS/BLUE SHIELD | Admitting: Internal Medicine

## 2015-12-10 MED ORDER — PHENTERMINE HCL 37.5 MG PO CAPS: 37.5000 mg | ORAL_CAPSULE | ORAL | Status: DC

## 2015-12-10 NOTE — Progress Notes (Signed)
Subjective:  Patient ID: Joseph Wood, male    DOB: 1971/07/19  Age: 44 y.o. MRN: [id number]  CC: Obesity; Hypertension; and Diabetes   HPI Joseph Wood presents for concerns about inability to lose weight. He has taken Belviq for about 2 or 3 months and is gained 2 pounds. He offers no complaints today.  Outpatient Prescriptions Prior to Visit  Medication Sig Dispense Refill  . acetaminophen (TYLENOL) 500 MG tablet Take 500 mg by mouth every 6 (six) hours as needed for mild pain, moderate pain or headache.    Marland Kitchen aspirin EC 81 MG tablet Take 1 tablet (81 mg total) by mouth daily. 90 tablet 3  . canagliflozin (INVOKANA) 300 MG TABS tablet Take 300 mg by mouth daily before breakfast. 90 tablet 3  . glucose blood (ONETOUCH VERIO) test strip Test up to TID 100 each 12  . Lancets (ONETOUCH ULTRASOFT) lancets Test up to TID 100 each 12  . metFORMIN (GLUCOPHAGE) 500 MG tablet Take 1 tablet (500 mg total) by mouth 2 (two) times daily with a meal. 60 tablet 5  . naproxen sodium (ANAPROX) 220 MG tablet Take 440-660 mg by mouth every 12 (twelve) hours as needed (pain).    Marland Kitchen omeprazole (PRILOSEC) 20 MG capsule Take 1 capsule (20 mg total) by mouth daily. 90 capsule 3  . valsartan (DIOVAN) 160 MG tablet Take 1 tablet (160 mg total) by mouth daily. 90 tablet 3  . Lorcaserin HCl (BELVIQ) 10 MG TABS Take 1 tablet by mouth 2 (two) times daily. 60 tablet 5   No facility-administered medications prior to visit.    ROS Review of Systems  Constitutional: Positive for unexpected weight change. Negative for fever, chills, diaphoresis, appetite change and fatigue.  HENT: Negative.   Eyes: Negative.   Respiratory: Negative.  Negative for cough, choking, chest tightness, shortness of breath and stridor.   Cardiovascular: Negative.  Negative for chest pain, palpitations and leg swelling.  Gastrointestinal: Negative.  Negative for nausea, vomiting, abdominal pain, diarrhea, constipation and blood in  stool.  Genitourinary: Negative.   Musculoskeletal: Negative.  Negative for back pain and arthralgias.  Skin: Negative.  Negative for color change.  Neurological: Negative.  Negative for dizziness.  Hematological: Negative.  Negative for adenopathy. Does not bruise/bleed easily.  Psychiatric/Behavioral: Negative.     Objective:  BP 120/82 mmHg  Pulse 72  Temp(Src) 98.3 F (36.8 C) (Oral)  Ht 5' 10"  (1.778 m)  Wt 393 lb (178.264 kg)  BMI 56.39 kg/m2  SpO2 96%  BP Readings from Last 3 Encounters:  12/10/15 120/82  10/27/15 122/82  09/29/15 130/84    Wt Readings from Last 3 Encounters:  12/10/15 393 lb (178.264 kg)  10/27/15 391 lb (177.356 kg)  09/29/15 390 lb (176.903 kg)    Physical Exam  Constitutional: He is oriented to person, place, and time. No distress.  HENT:  Mouth/Throat: Oropharynx is clear and moist. No oropharyngeal exudate.  Eyes: Conjunctivae are normal. Right eye exhibits no discharge. Left eye exhibits no discharge. No scleral icterus.  Neck: Normal range of motion. Neck supple. No JVD present. No tracheal deviation present. No thyromegaly present.  Cardiovascular: Normal rate, regular rhythm, normal heart sounds and intact distal pulses.  Exam reveals no gallop and no friction rub.   No murmur heard. Pulmonary/Chest: Effort normal and breath sounds normal. No stridor. No respiratory distress. He has no wheezes. He has no rales. He exhibits no tenderness.  Abdominal: Soft. Bowel sounds are normal.  He exhibits no distension and no mass. There is no tenderness. There is no rebound and no guarding.  Musculoskeletal: Normal range of motion. He exhibits no edema or tenderness.  Lymphadenopathy:    He has no cervical adenopathy.  Neurological: He is oriented to person, place, and time.  Skin: Skin is warm and dry. No rash noted. He is not diaphoretic. No erythema. No pallor.    Lab Results  Component Value Date   WBC 9.7 08/15/2015   HGB 16.1 08/15/2015    HCT 49.0 08/15/2015   PLT 291.0 08/15/2015   GLUCOSE 108* 10/27/2015   CHOL 166 02/12/2015   TRIG 135.0 02/12/2015   HDL 44.80 02/12/2015   LDLCALC 94 02/12/2015   ALT 24 07/29/2015   AST 23 07/29/2015   NA 141 10/27/2015   K 3.9 10/27/2015   CL 104 10/27/2015   CREATININE 0.89 10/27/2015   BUN 22 10/27/2015   CO2 30 10/27/2015   TSH 2.03 02/12/2015   PSA 1.01 06/23/2015   HGBA1C 6.3 10/27/2015   MICROALBUR 1.5 02/12/2015    Ct Renal Stone Study  07/29/2015  CLINICAL DATA:  Pelvic pain with gross hematuria today; no history of stones EXAM: CT ABDOMEN AND PELVIS WITHOUT CONTRAST TECHNIQUE: Multidetector CT imaging of the abdomen and pelvis was performed following the standard protocol without IV contrast. COMPARISON:  None. FINDINGS: Lower chest:  Normal Hepatobiliary: Mildly hyper attenuating gallbladder lumen suggests possibility of sludge. Otherwise normal. Pancreas: Normal Spleen: Normal Adrenals/Urinary Tract: 2 mm calcification within or adjacent to the distal left ureter just inside the left acetabulum. This could be a ureteral stone or phlebolith. No hydronephrosis. Right side negative. Other phleboliths are noted bilaterally. Stomach/Bowel: Evidence of probably chronic constipation as there is fecalized material throughout the nail. There is stool retained throughout the proximal third of the colon. Vascular/Lymphatic: Negative Reproductive: Not identified Other: No ascites Musculoskeletal: No acute findings IMPRESSION: In addition to multiple bilateral phleboliths in the pelvis, there is a 2 mm calcification either within or adjacent to the left ureter, possibly representing a tiny ureteral stone versus phlebolith. There is no hydronephrosis. Electronically Signed   By: Skipper Cliche M.D.   On: 07/29/2015 16:34    Assessment & Plan:   [name] was seen today for obesity, hypertension and diabetes.  Diagnoses and all orders for this visit:  Obesity, morbid, BMI 50 or higher  ([place]) - I recommended that he consider bariatric surgery but he is not willing to do that at this time. He has not lost weight with Belviq so will try phentermine. He will continue to work on his lifestyle modifications. -     phentermine 37.5 MG capsule; Take 1 capsule (37.5 mg total) by mouth every morning.  I have discontinued Mr. Cutting Lorcaserin HCl. I am also having him start on phentermine. Additionally, I am having him maintain his aspirin EC, valsartan, canagliflozin, onetouch ultrasoft, glucose blood, metFORMIN, naproxen sodium, acetaminophen, and omeprazole.  Meds ordered this encounter  Medications  . phentermine 37.5 MG capsule    Sig: Take 1 capsule (37.5 mg total) by mouth every morning.    Dispense:  30 capsule    Refill:  2     Follow-up: Return in about 3 months (around 03/09/2016).  Scarlette Calico, MD

## 2015-12-10 NOTE — Patient Instructions (Signed)

## 2015-12-10 NOTE — Progress Notes (Signed)
Pre visit review using our clinic review tool, if applicable. No additional management support is needed unless otherwise documented below in the visit note. 

## 2015-12-17 ENCOUNTER — Ambulatory Visit (INDEPENDENT_AMBULATORY_CARE_PROVIDER_SITE_OTHER): Payer: BLUE CROSS/BLUE SHIELD | Admitting: Podiatry

## 2015-12-17 DIAGNOSIS — M79671 Pain in right foot: Secondary | ICD-10-CM | POA: Diagnosis not present

## 2015-12-17 DIAGNOSIS — M779 Enthesopathy, unspecified: Secondary | ICD-10-CM | POA: Diagnosis not present

## 2015-12-17 DIAGNOSIS — M79672 Pain in left foot: Secondary | ICD-10-CM

## 2015-12-17 NOTE — Progress Notes (Signed)
   Subjective:    Patient ID: Joseph Wood, male    DOB: 1971-11-12, 45 y.o.   MRN: [id number]  HPI "My toes don't hurt anymore.  It hurts in the arch of the right foot.  Sometime my lower back hurts some.  I stand on concrete all day about 10 hours a day.  I brought those expensive shoes he suggested."    Review of Systems     Objective:   Physical Exam        Assessment & Plan:

## 2015-12-22 NOTE — Progress Notes (Signed)
Subjective:     Patient ID: Joseph Wood, male   DOB: 08/08/1971, 45 y.o.   MRN: [id number]  HPI patient presents stating that the arch seems improved but that he is still getting some discomfort in his lower back if he does a lot of walking   Review of Systems     Objective:   Physical Exam  neurovascular status intact muscle strength adequate with orthotics that are fitted well and in good alignment with no indications of issues as far as structure    Assessment:      tendinitis which is improving    Plan:      advised patient on physical therapy anti-inflammatories supportive shoes and continue orthotics and may have to change if symptoms persist over the next several months

## 2015-12-31 ENCOUNTER — Telehealth: Payer: Self-pay | Admitting: Internal Medicine

## 2015-12-31 NOTE — Telephone Encounter (Signed)
Please call patient regarding some questions he has about the diet pills that was prescribed to him. He didn't know the name of them

## 2016-01-01 NOTE — Telephone Encounter (Signed)
Pt stated he had accidentally had some leftover belviq and phentermine and experienced some changes in appetite. He states he feels the phentermine is not working out. He wanted to know if he combine the two. I strongly advised against since they were not prescribed together or could cause adverse effects He has a whole bottle of belviq and is going to try that again by itself

## 2016-01-08 ENCOUNTER — Encounter: Payer: Self-pay | Admitting: Internal Medicine

## 2016-01-08 ENCOUNTER — Ambulatory Visit (INDEPENDENT_AMBULATORY_CARE_PROVIDER_SITE_OTHER): Payer: BLUE CROSS/BLUE SHIELD | Admitting: Internal Medicine

## 2016-01-08 VITALS — BP 120/84 | HR 76 | Temp 98.7°F | Resp 20 | Wt 384.8 lb

## 2016-01-08 DIAGNOSIS — R109 Unspecified abdominal pain: Secondary | ICD-10-CM | POA: Diagnosis not present

## 2016-01-08 DIAGNOSIS — I10 Essential (primary) hypertension: Secondary | ICD-10-CM

## 2016-01-08 DIAGNOSIS — M722 Plantar fascial fibromatosis: Secondary | ICD-10-CM

## 2016-01-08 LAB — POCT URINALYSIS DIPSTICK
BILIRUBIN UA: NEGATIVE
KETONES UA: NEGATIVE
Leukocytes, UA: NEGATIVE
NITRITE UA: NEGATIVE
Protein, UA: NEGATIVE
RBC UA: NEGATIVE
Spec Grav, UA: 1.025
Urobilinogen, UA: NEGATIVE
pH, UA: 5.5

## 2016-01-08 MED ORDER — TRAMADOL HCL 50 MG PO TABS
50.0000 mg | ORAL_TABLET | Freq: Three times a day (TID) | ORAL | Status: DC | PRN
Start: 2016-01-08 — End: 2016-04-06

## 2016-01-08 MED ORDER — CIPROFLOXACIN HCL 500 MG PO TABS: 500.0000 mg | ORAL_TABLET | Freq: Two times a day (BID) | ORAL | Status: DC

## 2016-01-08 NOTE — Progress Notes (Signed)
Subjective:    Patient ID: Joseph Wood, male    DOB: 02/03/1971, 45 y.o.   MRN: [id number]  HPI  Here with 3-4 days of left sided close to flank area pain, sharp, usually mild but severe flares, worse with twist, standing and deep breathing can make it worse, nothing really makes better - still has sharp pains with turning over at night, just worse during the day. No raidation.  Has hx of renal stones, last one July 2016, then UTI in Aug 2016.  Denies urinary symptoms such as dysuria, frequency, urgency, flank pain, hematuria or n/v, fever, chills.  Denies worsening reflux, abd pain, dysphagia, n/v, bowel change or blood. Cannot recall any unsual position, lifting or excercises.  Pt continues to have recurring LBP without change in severity, bowel or bladder change, fever, wt loss,  worsening LE pain/numbness/weakness, gait change or falls. No overt fever, but did not have fever with previous infection.  Had gross hematuira last time, not this time however Wife does recall lifting some heavy cases of water at the grocery   Pt denies chest pain, increased sob or doe, wheezing, orthopnea, PND, increased LE swelling, palpitations, dizziness or syncope.  Pt denies new neurological symptoms such as new headache, or facial or extremity weakness or numbness   Pt denies polydipsia, polyuria, but does mention bilat heel pain worse in the past 2 wks, worse with first steps but also after prolonged sitting.       Past Medical History  Diagnosis Date  . Hypertension   . Obesity   . Sleep apnea   . Diabetes mellitus without complication [facility])    Past Surgical History  Procedure Laterality Date  . Acl repair      Right  . Colonoscopy with propofol  11/06/2012    Procedure: COLONOSCOPY WITH PROPOFOL;  Surgeon: Jerene Bears, MD;  Location: [facility];  Service: Gastroenterology;  Laterality: N/A;  Andrea/    reports that he has never smoked. He has never used smokeless tobacco. He reports that he does  not drink alcohol or use illicit drugs. family history includes Aneurysm in his father; Arthritis in his mother; Breast cancer in his mother; Diabetes in his father and mother; Hypertension in his father and mother; Prostate cancer in his father. Allergies  Allergen Reactions  . Benazepril Hcl Cough   Current Outpatient Prescriptions on File Prior to Visit  Medication Sig Dispense Refill  . acetaminophen (TYLENOL) 500 MG tablet Take 500 mg by mouth every 6 (six) hours as needed for mild pain, moderate pain or headache.    Marland Kitchen aspirin EC 81 MG tablet Take 1 tablet (81 mg total) by mouth daily. 90 tablet 3  . canagliflozin (INVOKANA) 300 MG TABS tablet Take 300 mg by mouth daily before breakfast. 90 tablet 3  . Lancets (ONETOUCH ULTRASOFT) lancets Test up to TID 100 each 12  . metFORMIN (GLUCOPHAGE) 500 MG tablet Take 1 tablet (500 mg total) by mouth 2 (two) times daily with a meal. 60 tablet 5  . naproxen sodium (ANAPROX) 220 MG tablet Take 440-660 mg by mouth every 12 (twelve) hours as needed (pain).    Marland Kitchen omeprazole (PRILOSEC) 20 MG capsule Take 1 capsule (20 mg total) by mouth daily. 90 capsule 3  . phentermine 37.5 MG capsule Take 1 capsule (37.5 mg total) by mouth every morning. 30 capsule 2  . valsartan (DIOVAN) 160 MG tablet Take 1 tablet (160 mg total) by mouth daily. 90 tablet 3  No current facility-administered medications on file prior to visit.   Review of Systems  Constitutional: Negative for unusual diaphoresis or night sweats HENT: Negative for ringing in ear or discharge Eyes: Negative for double vision or worsening visual disturbance.  Respiratory: Negative for choking and stridor.   Gastrointestinal: Negative for vomiting or other signifcant bowel change Genitourinary: Negative for hematuria or change in urine volume.  Musculoskeletal: Negative for other MSK pain or swelling Skin: Negative for color change and worsening wound.  Neurological: Negative for tremors and  numbness other than noted  Psychiatric/Behavioral: Negative for decreased concentration or agitation other than above       Objective:   Physical Exam BP 120/84 mmHg  Pulse 76  Temp(Src) 98.7 F (37.1 C) (Oral)  Resp 20  Wt 384 lb 12 oz (174.521 kg)  SpO2 97% VS noted,  Constitutional: Pt appears in no significant distress HENT: Head: NCAT.  Right Ear: External ear normal.  Left Ear: External ear normal.  Eyes: . Pupils are equal, round, and reactive to light. Conjunctivae and EOM are normal Neck: Normal range of motion. Neck supple.  Cardiovascular: Normal rate and regular rhythm.   Pulmonary/Chest: Effort normal and breath sounds without rales or wheezing.  Abd:  Soft, NT, ND, + BS, mild tender localized left flank palpated Neurological: Pt is alert. Not confused , motor grossly intact Skin: Skin is warm. No rash, no LE edema Psychiatric: Pt behavior is normal. No agitation.  Bilat heel plantar tender as well, without swelling or skin change  POCT Urinalysis Dipstick  Status: Finalresult Visible to patient:  Not Released Dx:  Left flank pain              Ref Range 6:25 PM  70moago  544mogo  1167moo     Color, UA  yellow       Clarity, UA  cloudy       Glucose, UA  3+  >1000 (A)     Bilirubin, UA  negative       Ketones, UA  negative       Spec Grav, UA  1.025       Blood, UA  negative       pH, UA  5.5       Protein, UA  negative       Urobilinogen, UA  negative Color Interference (A) 0.2 0.2    Nitrite, UA  negative       Leukocytes, UA Negative  Negative                Assessment & Plan:

## 2016-01-08 NOTE — Patient Instructions (Signed)
Please take all new medication as prescribed  - the pain medication, and antibiotic  Please return in the AM for the urine studies  Please continue all other medications as before, and refills have been done if requested.  Please have the pharmacy call with any other refills you may need.  Please keep your appointments with your specialists as you may have planned

## 2016-01-08 NOTE — Progress Notes (Signed)
Pre visit review using our clinic review tool, if applicable. No additional management support is needed unless otherwise documented below in the visit note. 

## 2016-01-09 ENCOUNTER — Telehealth: Payer: Self-pay | Admitting: Internal Medicine

## 2016-01-09 ENCOUNTER — Other Ambulatory Visit (INDEPENDENT_AMBULATORY_CARE_PROVIDER_SITE_OTHER): Payer: BLUE CROSS/BLUE SHIELD

## 2016-01-09 DIAGNOSIS — R109 Unspecified abdominal pain: Secondary | ICD-10-CM | POA: Diagnosis not present

## 2016-01-09 LAB — URINALYSIS, ROUTINE W REFLEX MICROSCOPIC
BILIRUBIN URINE: NEGATIVE
HGB URINE DIPSTICK: NEGATIVE
KETONES UR: NEGATIVE
LEUKOCYTES UA: NEGATIVE
Nitrite: NEGATIVE
Specific Gravity, Urine: >=1.03 — AB (ref 1.000–1.030)
TOTAL PROTEIN, URINE-UPE24: NEGATIVE
UROBILINOGEN UA: 0.2 (ref 0.0–1.0)
Urine Glucose: >=1000 — AB
pH: 5.5 (ref 5.0–8.0)

## 2016-01-09 MED ORDER — BAYER CONTOUR NEXT MONITOR W/DEVICE KIT: PACK | Status: DC

## 2016-01-09 MED ORDER — GLUCOSE BLOOD VI STRP: ORAL_STRIP | Status: DC

## 2016-01-09 NOTE — Telephone Encounter (Signed)
Pt's insurance will no longer be accepting his blood sugar monitors and they gave him a coupon for [employer] portfolio meter and was hoping to go ahead and switch now. Pharmacy is [facility] on [address] Pt is aware Dr. Ronnald Ramp is out till next week

## 2016-01-09 NOTE — Telephone Encounter (Signed)
Done

## 2016-01-10 NOTE — Assessment & Plan Note (Signed)
C/w tendonitis, for soft soled shoes, stretch excercises, wt loss, shoe inserts if needed,  to f/u any worsening symptoms or concerns

## 2016-01-10 NOTE — Assessment & Plan Note (Addendum)
I suspect MSK etiology, but cant r/o renal or other given the hx; for pain control and empiric antibx, urine studies, could stop antibx ifculture neg, and consider other BMP, cbc and even CT if UA is abnormal.

## 2016-01-10 NOTE — Assessment & Plan Note (Signed)
stable overall by history and exam, recent data reviewed with pt, and pt to continue medical treatment as before,  to f/u any worsening symptoms or concerns BP Readings from Last 3 Encounters:  01/08/16 120/84  12/10/15 120/82  10/27/15 122/82

## 2016-02-04 ENCOUNTER — Ambulatory Visit: Payer: Self-pay | Admitting: Internal Medicine

## 2016-02-04 ENCOUNTER — Telehealth: Payer: Self-pay | Admitting: Internal Medicine

## 2016-02-04 ENCOUNTER — Telehealth: Payer: Self-pay

## 2016-02-04 NOTE — Telephone Encounter (Signed)
i have talked with patient----patient states he still has pain medication and would rather be seen by dr Jenny Reichmann again at [place] office--patient states this is ongoing problem from 1 month ago---patient states he is ok to wait until Friday 2/24 at 10:45 to be seen, appt. With dr Regis Bill for today has been cancelled---no further action required by dr john----patient is seeing dr Jenny Reichmann on 2/24 at 10:45

## 2016-02-04 NOTE — Telephone Encounter (Signed)
Dr Regis Bill has asked if you could check in your office notes to see if this patient's symptoms today could be related to needing more pain medication that you prescribed for him about a month ago (also seen by you for the same reason he's wanting to be seen today)----[facility] has scheduled this patient with dr Regis Bill for 12 noon today (taking the last acute visit spot)-----if you think it's ok, could we possibly call patient and see him tomorrow and possibly give more pain medication?
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
## 2016-02-06 ENCOUNTER — Encounter: Payer: Self-pay | Admitting: Internal Medicine

## 2016-02-06 ENCOUNTER — Ambulatory Visit (INDEPENDENT_AMBULATORY_CARE_PROVIDER_SITE_OTHER)
Admission: RE | Admit: 2016-02-06 | Discharge: 2016-02-06 | Disposition: A | Discharge status: Home / Self Care | Payer: BLUE CROSS/BLUE SHIELD | Source: Ambulatory Visit | Attending: Internal Medicine | Admitting: Internal Medicine

## 2016-02-06 ENCOUNTER — Other Ambulatory Visit: Payer: BLUE CROSS/BLUE SHIELD

## 2016-02-06 ENCOUNTER — Ambulatory Visit (INDEPENDENT_AMBULATORY_CARE_PROVIDER_SITE_OTHER): Payer: BLUE CROSS/BLUE SHIELD | Admitting: Internal Medicine

## 2016-02-06 VITALS — BP 118/82 | HR 75 | Temp 97.9°F | Resp 20 | Wt 386.0 lb

## 2016-02-06 DIAGNOSIS — M545 Low back pain, unspecified: Secondary | ICD-10-CM

## 2016-02-06 DIAGNOSIS — R81 Glycosuria: Secondary | ICD-10-CM | POA: Diagnosis not present

## 2016-02-06 DIAGNOSIS — M5431 Sciatica, right side: Secondary | ICD-10-CM | POA: Diagnosis not present

## 2016-02-06 DIAGNOSIS — N23 Unspecified renal colic: Secondary | ICD-10-CM

## 2016-02-06 DIAGNOSIS — M48061 Spinal stenosis, lumbar region without neurogenic claudication: Secondary | ICD-10-CM | POA: Insufficient documentation

## 2016-02-06 LAB — POCT URINALYSIS DIPSTICK
BILIRUBIN UA: NEGATIVE
Ketones, UA: NEGATIVE
LEUKOCYTES UA: NEGATIVE
NITRITE UA: NEGATIVE
PH UA: 6
PROTEIN UA: NEGATIVE
RBC UA: NEGATIVE
Spec Grav, UA: 1.025
Urobilinogen, UA: NEGATIVE

## 2016-02-06 MED ORDER — CYCLOBENZAPRINE HCL 5 MG PO TABS: 5.0000 mg | ORAL_TABLET | Freq: Three times a day (TID) | ORAL | Status: DC | PRN

## 2016-02-06 NOTE — Assessment & Plan Note (Signed)
Intermittent, mild, none recent, exam benign,  to f/u any worsening symptoms or concerns

## 2016-02-06 NOTE — Progress Notes (Signed)
Pre visit review using our clinic review tool, if applicable. No additional management support is needed unless otherwise documented below in the visit note. 

## 2016-02-06 NOTE — Patient Instructions (Signed)
Please take all new medication as prescribed - the muscle relaxer  Please continue all other medications as before, and refills have been done if requested.  Please have the pharmacy call with any other refills you may need.  Please keep your appointments with your specialists as you may have planned  Please go to the XRAY Department in the Basement (go straight as you get off the elevator) for the x-ray testing  You will be contacted by phone if any changes need to be made immediately.  Otherwise, you will receive a letter about your results with an explanation, but please check with MyChart first.  Please remember to sign up for MyChart if you have not done so, as this will be important to you in the future with finding out test results, communicating by private email, and scheduling acute appointments online when needed.

## 2016-02-06 NOTE — Assessment & Plan Note (Signed)
Most likely related to invokana use,  to f/u any worsening symptoms or concerns

## 2016-02-06 NOTE — Assessment & Plan Note (Addendum)
udip neg, new onset -  for ls spine films r/o rheum dz,   But most c/w msk spasm, for muscle relaxer prn,  to f/u any worsening symptoms or concerns

## 2016-02-06 NOTE — Progress Notes (Signed)
Subjective:    Patient ID: Joseph Wood, male    DOB: 01/04/1971, 45 y.o.   MRN: [id number]  HPI  Here with mod dull bilat lower back pain, no bowel or bladder change, fever, wt loss,  worsening LE pain/numbness/weakness, gait change or falls.  Worse to bend or stand up. States c/o same pain as aug 2016 with pain, and CT c/w tiny left ureteral stone of unclear significance.  Multiple urine cx's neg.  Has not had prior significant MSK or spine pain.  Remains morbid obese.  Has not engaged in significant new excercises or activity recently. Denies urinary symptoms such as dysuria, frequency, urgency, flank pain, hematuria or n/v, fever, chills.   Pt denies fever, wt loss, night sweats, loss of appetite, or other constitutional symptoms   Wt Readings from Last 3 Encounters:  02/06/16 386 lb (175.088 kg)  01/08/16 384 lb 12 oz (174.521 kg)  12/10/15 393 lb (178.264 kg)  Did not tolerated tramadol due to dizziness from last visit.  Does have hx of right lower back pain with intermittent right leg numbness but none recent. Past Medical History  Diagnosis Date  . Hypertension   . Obesity   . Sleep apnea   . Diabetes mellitus without complication [facility])    Past Surgical History  Procedure Laterality Date  . Acl repair      Right  . Colonoscopy with propofol  11/06/2012    Procedure: COLONOSCOPY WITH PROPOFOL;  Surgeon: Jerene Bears, MD;  Location: [facility];  Service: Gastroenterology;  Laterality: N/A;  Andrea/    reports that he has never smoked. He has never used smokeless tobacco. He reports that he does not drink alcohol or use illicit drugs. family history includes Aneurysm in his father; Arthritis in his mother; Breast cancer in his mother; Diabetes in his father and mother; Hypertension in his father and mother; Prostate cancer in his father. Allergies  Allergen Reactions  . Benazepril Hcl Cough  . Tramadol Other (See Comments)    dizziness   Current Outpatient Prescriptions  on File Prior to Visit  Medication Sig Dispense Refill  . acetaminophen (TYLENOL) 500 MG tablet Take 500 mg by mouth every 6 (six) hours as needed for mild pain, moderate pain or headache.    Marland Kitchen aspirin EC 81 MG tablet Take 1 tablet (81 mg total) by mouth daily. 90 tablet 3  . Blood Glucose Monitoring Suppl (BAYER CONTOUR NEXT MONITOR) w/Device KIT Use to check blood sugar DX E11.8 1 kit 0  . canagliflozin (INVOKANA) 300 MG TABS tablet Take 300 mg by mouth daily before breakfast. 90 tablet 3  . ciprofloxacin (CIPRO) 500 MG tablet Take 1 tablet (500 mg total) by mouth 2 (two) times daily. 20 tablet 0  . glucose blood (BAYER CONTOUR NEXT TEST) test strip Use as instructed DX E11.8 100 each 4  . Lancets (ONETOUCH ULTRASOFT) lancets Test up to TID 100 each 12  . metFORMIN (GLUCOPHAGE) 500 MG tablet Take 1 tablet (500 mg total) by mouth 2 (two) times daily with a meal. 60 tablet 5  . naproxen sodium (ANAPROX) 220 MG tablet Take 440-660 mg by mouth every 12 (twelve) hours as needed (pain).    Marland Kitchen omeprazole (PRILOSEC) 20 MG capsule Take 1 capsule (20 mg total) by mouth daily. 90 capsule 3  . phentermine 37.5 MG capsule Take 1 capsule (37.5 mg total) by mouth every morning. 30 capsule 2  . traMADol (ULTRAM) 50 MG tablet Take 1 tablet (  50 mg total) by mouth every 8 (eight) hours as needed. 50 tablet 0  . valsartan (DIOVAN) 160 MG tablet Take 1 tablet (160 mg total) by mouth daily. 90 tablet 3   No current facility-administered medications on file prior to visit.     Review of Systems  Constitutional: Negative for unusual diaphoresis or night sweats HENT: Negative for ringing in ear or discharge Eyes: Negative for double vision or worsening visual disturbance.  Respiratory: Negative for choking and stridor.   Gastrointestinal: Negative for vomiting or other signifcant bowel change Genitourinary: Negative for hematuria or change in urine volume.  Musculoskeletal: Negative for other MSK pain or  swelling Skin: Negative for color change and worsening wound.  Neurological: Negative for tremors and numbness other than noted  Psychiatric/Behavioral: Negative for decreased concentration or agitation other than above       Objective:   Physical Exam BP 118/82 mmHg  Pulse 75  Temp(Src) 97.9 F (36.6 C) (Oral)  Resp 20  Wt 386 lb (175.088 kg)  SpO2 92% VS noted,  Constitutional: Pt appears in no significant distress HENT: Head: NCAT.  Right Ear: External ear normal.  Left Ear: External ear normal.  Eyes: . Pupils are equal, round, and reactive to light. Conjunctivae and EOM are normal Neck: Normal range of motion. Neck supple.  Cardiovascular: Normal rate and regular rhythm.   Pulmonary/Chest: Effort normal and breath sounds without rales or wheezing.  Abd:  Soft, NT, ND, + BS Spine nontender Has bilat lumbar paravertebal tender spasms Neurological: Pt is alert. Not confused , motor grossly intact Skin: Skin is warm. No rash, no LE edema Psychiatric: Pt behavior is normal. No agitation.    POCT Urinalysis Dipstick  Status: Finalresult Visible to patient:  Not Released Dx:  Kidney pain              Ref Range 11:04 AM (02/06/16) 4wk ago (01/09/16) 4wk ago (01/08/16) 26moago (08/15/15)    Color, UA  yellow  yellow     Clarity, UA  cloudy  cloudy     Glucose, UA  3+  3+     Bilirubin, UA  negative  negative     Ketones, UA  negative  negative     Spec Grav, UA  1.025  1.025     Blood, UA  negative  negative     pH, UA  6.0  5.5     Protein, UA  negative  negative     Urobilinogen, UA  negative 0.2 negative Color Interference (A)    Nitrite, UA  negative  negative     Leukocytes, UA Negative  Negative               Assessment & Plan:

## 2016-02-08 LAB — CULTURE, URINE COMPREHENSIVE
COLONY COUNT: NO GROWTH
ORGANISM ID, BACTERIA: NO GROWTH

## 2016-02-09 ENCOUNTER — Telehealth: Payer: Self-pay | Admitting: Internal Medicine

## 2016-02-09 ENCOUNTER — Other Ambulatory Visit: Payer: Self-pay | Admitting: Internal Medicine

## 2016-02-09 DIAGNOSIS — M545 Low back pain, unspecified: Secondary | ICD-10-CM

## 2016-02-09 NOTE — Telephone Encounter (Signed)
Informed pt of letter. He would like your opinion on xray. States the tramadol is not helping and cannot tell a difference with flexeril taking tid when pt can remember to take at that sig.

## 2016-02-09 NOTE — Telephone Encounter (Signed)
Pt called in, had xrays done last week.  He would like to know the results of them.  He seen dr Jenny Reichmann but he is gone this week.

## 2016-02-09 NOTE — Telephone Encounter (Signed)
The xray shows mild arthritis I will send an order for PT

## 2016-02-10 NOTE — Telephone Encounter (Signed)
LMOVM informing pt

## 2016-02-13 ENCOUNTER — Encounter: Payer: Self-pay | Admitting: Internal Medicine

## 2016-02-18 ENCOUNTER — Other Ambulatory Visit: Payer: Self-pay | Admitting: Internal Medicine

## 2016-02-23 ENCOUNTER — Ambulatory Visit: Payer: BLUE CROSS/BLUE SHIELD | Admitting: Internal Medicine

## 2016-03-09 ENCOUNTER — Ambulatory Visit: Payer: BLUE CROSS/BLUE SHIELD | Admitting: Internal Medicine

## 2016-03-22 ENCOUNTER — Ambulatory Visit: Payer: BLUE CROSS/BLUE SHIELD | Admitting: Internal Medicine

## 2016-03-29 ENCOUNTER — Telehealth: Payer: Self-pay | Admitting: Internal Medicine

## 2016-03-29 ENCOUNTER — Ambulatory Visit: Payer: BLUE CROSS/BLUE SHIELD | Admitting: Internal Medicine

## 2016-03-29 NOTE — Telephone Encounter (Signed)
Call him to reschedule an appointment

## 2016-03-29 NOTE — Telephone Encounter (Signed)
Rescheduled patient

## 2016-03-29 NOTE — Telephone Encounter (Signed)
Patient no showed for follow up on 4/17.  Please advise.

## 2016-04-02 ENCOUNTER — Other Ambulatory Visit: Payer: Self-pay | Admitting: Internal Medicine

## 2016-04-06 ENCOUNTER — Ambulatory Visit (INDEPENDENT_AMBULATORY_CARE_PROVIDER_SITE_OTHER): Payer: BLUE CROSS/BLUE SHIELD | Admitting: Internal Medicine

## 2016-04-06 ENCOUNTER — Other Ambulatory Visit (INDEPENDENT_AMBULATORY_CARE_PROVIDER_SITE_OTHER): Payer: BLUE CROSS/BLUE SHIELD

## 2016-04-06 ENCOUNTER — Encounter: Payer: Self-pay | Admitting: Internal Medicine

## 2016-04-06 VITALS — BP 118/80 | HR 92 | Temp 98.4°F | Resp 16 | Ht 70.0 in | Wt 396.0 lb

## 2016-04-06 DIAGNOSIS — I10 Essential (primary) hypertension: Secondary | ICD-10-CM

## 2016-04-06 DIAGNOSIS — J4530 Mild persistent asthma, uncomplicated: Secondary | ICD-10-CM | POA: Insufficient documentation

## 2016-04-06 DIAGNOSIS — R05 Cough: Secondary | ICD-10-CM

## 2016-04-06 DIAGNOSIS — E785 Hyperlipidemia, unspecified: Secondary | ICD-10-CM

## 2016-04-06 DIAGNOSIS — R059 Cough, unspecified: Secondary | ICD-10-CM | POA: Insufficient documentation

## 2016-04-06 DIAGNOSIS — E118 Type 2 diabetes mellitus with unspecified complications: Secondary | ICD-10-CM | POA: Diagnosis not present

## 2016-04-06 LAB — URINALYSIS, ROUTINE W REFLEX MICROSCOPIC
Bilirubin Urine: NEGATIVE
Hgb urine dipstick: NEGATIVE
Ketones, ur: NEGATIVE
Leukocytes, UA: NEGATIVE
Nitrite: NEGATIVE
Specific Gravity, Urine: 1.015 (ref 1.000–1.030)
TOTAL PROTEIN, URINE-UPE24: NEGATIVE
UROBILINOGEN UA: 0.2 (ref 0.0–1.0)
WBC UA: NONE SEEN — AB (ref 0–?)
pH: 6 (ref 5.0–8.0)

## 2016-04-06 LAB — BASIC METABOLIC PANEL
BUN: 19 mg/dL (ref 6–23)
CALCIUM: 9.7 mg/dL (ref 8.4–10.5)
CO2: 29 meq/L (ref 19–32)
Chloride: 105 mEq/L (ref 96–112)
Creatinine, Ser: 1.31 mg/dL (ref 0.40–1.50)
GFR: 62.95 mL/min (ref >60.00)
Glucose, Bld: 136 mg/dL — ABNORMAL HIGH (ref 70–99)
POTASSIUM: 4.2 meq/L (ref 3.5–5.1)
SODIUM: 141 meq/L (ref 135–145)

## 2016-04-06 LAB — LIPID PANEL
CHOL/HDL RATIO: 4
Cholesterol: 169 mg/dL (ref 0–200)
HDL: 48 mg/dL (ref >39.00)
LDL Cholesterol: 89 mg/dL (ref 0–99)
NonHDL: 120.8
TRIGLYCERIDES: 160 mg/dL — AB (ref 0.0–149.0)
VLDL: 32 mg/dL (ref 0.0–40.0)

## 2016-04-06 LAB — HEMOGLOBIN A1C: HEMOGLOBIN A1C: 6.5 % (ref 4.6–6.5)

## 2016-04-06 LAB — MICROALBUMIN / CREATININE URINE RATIO
Creatinine,U: 90.3 mg/dL
Microalb Creat Ratio: 0.8 mg/g (ref 0.0–30.0)

## 2016-04-06 LAB — TSH: TSH: 1.58 u[IU]/mL (ref 0.35–4.50)

## 2016-04-06 MED ORDER — FLUTICASONE FUROATE-VILANTEROL 200-25 MCG/INH IN AEPB: 1.0000 | INHALATION_SPRAY | Freq: Every day | RESPIRATORY_TRACT | Status: DC

## 2016-04-06 MED ORDER — PHENTERMINE-TOPIRAMATE ER 3.75-23 MG PO CP24: 1.0000 | ORAL_CAPSULE | Freq: Every day | ORAL | Status: DC

## 2016-04-06 MED ORDER — PHENTERMINE-TOPIRAMATE ER 7.5-46 MG PO CP24: 1.0000 | ORAL_CAPSULE | Freq: Every day | ORAL | Status: DC

## 2016-04-06 NOTE — Patient Instructions (Signed)

## 2016-04-06 NOTE — Progress Notes (Signed)
Pre visit review using our clinic review tool, if applicable. No additional management support is needed unless otherwise documented below in the visit note. 

## 2016-04-06 NOTE — Progress Notes (Signed)
Subjective:  Patient ID: Joseph Wood, male    DOB: [birth year]  Age: 46 y.o. MRN: [id number]  CC: Cough   HPI Schneur J Prieto presents for follow-up on diabetes and obesity. He complains of an intermittent nonproductive cough for about 6 weeks. The cough is most noticeable when he starts to speak. He denies fever, chills, night sweats, shortness of breath, wheezing, chest pain, or hemoptysis.  Regarding obesity, he has not benefited from trials of Belviq and phentermine. Since I last saw him he has gained 10 pounds. He is working on his diet and exercise regimen.  He tells me his blood sugars are well-controlled on the combination of metformin and Invokana. He denies polyuria, polydipsia, or polyphagia. He also tells me his blood pressure is well-controlled on valsartan.  Outpatient Prescriptions Prior to Visit  Medication Sig Dispense Refill  . acetaminophen (TYLENOL) 500 MG tablet Take 500 mg by mouth every 6 (six) hours as needed for mild pain, moderate pain or headache.    Marland Kitchen aspirin EC 81 MG tablet Take 1 tablet (81 mg total) by mouth daily. 90 tablet 3  . Blood Glucose Monitoring Suppl (BAYER CONTOUR NEXT MONITOR) w/Device KIT Use to check blood sugar DX E11.8 1 kit 0  . cyclobenzaprine (FLEXERIL) 5 MG tablet Take 1 tablet (5 mg total) by mouth 3 (three) times daily as needed for muscle spasms. 60 tablet 1  . glucose blood (BAYER CONTOUR NEXT TEST) test strip Use as instructed DX E11.8 100 each 4  . INVOKANA 300 MG TABS tablet TAKE ONE TABLET BY MOUTH ONCE DAILY BEFORE BREAKFAST 90 tablet 1  . Lancets (ONETOUCH ULTRASOFT) lancets Test up to TID 100 each 12  . metFORMIN (GLUCOPHAGE) 500 MG tablet TAKE ONE TABLET BY MOUTH TWICE DAILY WITH A MEAL 60 tablet 1  . omeprazole (PRILOSEC) 20 MG capsule Take 1 capsule (20 mg total) by mouth daily. 90 capsule 3  . valsartan (DIOVAN) 160 MG tablet TAKE ONE TABLET BY MOUTH ONCE DAILY 90 tablet 1  . BELVIQ 10 MG TABS TAKE ONE TABLET BY  MOUTH TWICE DAILY 60 tablet 3  . ciprofloxacin (CIPRO) 500 MG tablet Take 1 tablet (500 mg total) by mouth 2 (two) times daily. 20 tablet 0  . naproxen sodium (ANAPROX) 220 MG tablet Take 440-660 mg by mouth every 12 (twelve) hours as needed (pain).    . phentermine 37.5 MG capsule Take 1 capsule (37.5 mg total) by mouth every morning. 30 capsule 2  . traMADol (ULTRAM) 50 MG tablet Take 1 tablet (50 mg total) by mouth every 8 (eight) hours as needed. 50 tablet 0   No facility-administered medications prior to visit.    ROS Review of Systems  Constitutional: Positive for unexpected weight change. Negative for fever, chills, diaphoresis, activity change, appetite change and fatigue.  HENT: Negative.  Negative for sinus pressure and trouble swallowing.   Eyes: Negative.  Negative for visual disturbance.  Respiratory: Positive for cough. Negative for choking, chest tightness, shortness of breath, wheezing and stridor.   Cardiovascular: Negative.  Negative for chest pain, palpitations and leg swelling.  Gastrointestinal: Negative.  Negative for nausea, vomiting, abdominal pain, diarrhea, constipation and blood in stool.  Endocrine: Negative.   Genitourinary: Negative.  Negative for dysuria and difficulty urinating.  Musculoskeletal: Negative.  Negative for myalgias, back pain, arthralgias and neck pain.  Skin: Negative.  Negative for color change and rash.  Allergic/Immunologic: Negative.   Neurological: Negative.  Negative for dizziness, weakness and  light-headedness.  Hematological: Negative.  Negative for adenopathy. Does not bruise/bleed easily.  Psychiatric/Behavioral: Negative.     Objective:  BP 118/80 mmHg  Pulse 92  Temp(Src) 98.4 F (36.9 C) (Oral)  Resp 16  Ht 5' 10"  (1.778 m)  Wt 396 lb (179.624 kg)  BMI 56.82 kg/m2  SpO2 93%  BP Readings from Last 3 Encounters:  04/06/16 118/80  02/06/16 118/82  01/08/16 120/84    Wt Readings from Last 3 Encounters:  04/06/16 396  lb (179.624 kg)  02/06/16 386 lb (175.088 kg)  01/08/16 384 lb 12 oz (174.521 kg)    Physical Exam  Constitutional: He is oriented to person, place, and time. He appears well-developed and well-nourished. No distress.  HENT:  Mouth/Throat: Oropharynx is clear and moist. No oropharyngeal exudate.  Eyes: Conjunctivae are normal. Right eye exhibits no discharge. Left eye exhibits no discharge. No scleral icterus.  Neck: Normal range of motion. Neck supple. No JVD present. No tracheal deviation present. No thyromegaly present.  Cardiovascular: Normal rate, regular rhythm, normal heart sounds and intact distal pulses.  Exam reveals no gallop and no friction rub.   No murmur heard. Pulmonary/Chest: Effort normal and breath sounds normal. No accessory muscle usage or stridor. No tachypnea. No respiratory distress. He has no decreased breath sounds. He has no wheezes. He has no rhonchi. He has no rales. He exhibits no tenderness.  Abdominal: Soft. Bowel sounds are normal. He exhibits no distension and no mass. There is no tenderness. There is no rebound and no guarding.  Musculoskeletal: Normal range of motion. He exhibits no edema or tenderness.  Lymphadenopathy:    He has no cervical adenopathy.  Neurological: He is oriented to person, place, and time.  Skin: Skin is warm and dry. No rash noted. He is not diaphoretic. No erythema. No pallor.  Vitals reviewed.   Lab Results  Component Value Date   WBC 9.7 08/15/2015   HGB 16.1 08/15/2015   HCT 49.0 08/15/2015   PLT 291.0 08/15/2015   GLUCOSE 136* 04/06/2016   CHOL 169 04/06/2016   TRIG 160.0* 04/06/2016   HDL 48.00 04/06/2016   LDLCALC 89 04/06/2016   ALT 24 07/29/2015   AST 23 07/29/2015   NA 141 04/06/2016   K 4.2 04/06/2016   CL 105 04/06/2016   CREATININE 1.31 04/06/2016   BUN 19 04/06/2016   CO2 29 04/06/2016   TSH 1.58 04/06/2016   PSA 1.01 06/23/2015   HGBA1C 6.5 04/06/2016   MICROALBUR <0.7 04/06/2016    Dg Lumbar  Spine Complete  02/06/2016  CLINICAL DATA:  Chronic low back pain and right leg pain. EXAM: LUMBAR SPINE - COMPLETE 4+ VIEW COMPARISON:  CT scan 07/29/2015. FINDINGS: Normal alignment of the lumbar vertebral bodies. Disc spaces and vertebral bodies are maintained. Stable mild wedging and degenerative changes involving the lower thoracic vertebral bodies. The facets are normally aligned. No pars defects. The visualized bony pelvis is intact. The SI joints appear normal. IMPRESSION: Normal alignment and no acute bony findings. Mild degenerative changes. Electronically Signed   By: Marijo Sanes M.D.   On: 02/06/2016 14:40    Assessment & Plan:   Hall was seen today for cough.  Diagnoses and all orders for this visit:  Type 2 diabetes mellitus with complication, without long-term current use of insulin ([place])- his A1c is 6.5%, his blood sugars are adequately well controlled. -     Basic metabolic panel; Future -     Microalbumin / creatinine urine  ratio; Future -     Hemoglobin A1c; Future -     Phentermine-Topiramate (QSYMIA) 3.75-23 MG CP24; Take 1 capsule by mouth daily.  HYPERTENSION, BENIGN ESSENTIAL- his blood pressure is well-controlled, electrolytes and renal function are stable. -     TSH; Future -     Urinalysis, Routine w reflex microscopic (not at [facility]); Future  Hyperlipidemia with target LDL less than 100- he has not achieved his LDL goal of 70, I've asked him to start taking a statin. -     Lipid panel; Future -     TSH; Future  Obesity, morbid, BMI 50 or higher ([place])- he tells me that he has episodes of binge eating due to stress, I think Qsymia is a good option for him, he will start a low-dose for 14 days and then will gradually increase the dose over the next year. -     Phentermine-Topiramate (QSYMIA) 3.75-23 MG CP24; Take 1 capsule by mouth daily. -     Phentermine-Topiramate (QSYMIA) 7.5-46 MG CP24; Take 1 capsule by mouth daily.  Cough- I've asked him to have a chest  x-ray to look for mass, infiltrate, interstitial findings. I've also ordered pulmonary functioning testing to confirm that his cough is related to asthma and to check his overall lung functioning. With the obesity am also concerned that he may have a restrictive lung disease. -     DG Chest 2 View; Future -     Pulmonary Function Test; Future  Asthma, mild persistent, uncomplicated- his symptoms are suspicious for asthma, will start treating with Breo-he was given a sample and shown how to use it, he demonstrated proficiency with its use. -     fluticasone furoate-vilanterol (BREO ELLIPTA) 200-25 MCG/INH AEPB; Inhale 1 puff into the lungs daily. -     Pulmonary Function Test; Future  I have discontinued Mr. Bourke naproxen sodium, phentermine, traMADol, ciprofloxacin, and BELVIQ. I am also having him start on Phentermine-Topiramate, Phentermine-Topiramate, and fluticasone furoate-vilanterol. Additionally, I am having him maintain his aspirin EC, onetouch ultrasoft, acetaminophen, omeprazole, BAYER CONTOUR NEXT MONITOR, glucose blood, cyclobenzaprine, valsartan, metFORMIN, and INVOKANA.  Meds ordered this encounter  Medications  . Phentermine-Topiramate (QSYMIA) 3.75-23 MG CP24    Sig: Take 1 capsule by mouth daily.    Dispense:  14 capsule    Refill:  0  . Phentermine-Topiramate (QSYMIA) 7.5-46 MG CP24    Sig: Take 1 capsule by mouth daily.    Dispense:  30 capsule    Refill:  2  . fluticasone furoate-vilanterol (BREO ELLIPTA) 200-25 MCG/INH AEPB    Sig: Inhale 1 puff into the lungs daily.    Dispense:  30 each    Refill:  11     Follow-up: Return in about 3 months (around 07/06/2016).  Scarlette Calico, MD

## 2016-04-07 ENCOUNTER — Encounter: Payer: Self-pay | Admitting: Internal Medicine

## 2016-04-07 MED ORDER — ATORVASTATIN CALCIUM 10 MG PO TABS: 10.0000 mg | ORAL_TABLET | Freq: Every day | ORAL | Status: DC

## 2016-05-06 ENCOUNTER — Other Ambulatory Visit: Payer: Self-pay | Admitting: Internal Medicine

## 2016-06-24 ENCOUNTER — Ambulatory Visit (INDEPENDENT_AMBULATORY_CARE_PROVIDER_SITE_OTHER): Payer: BLUE CROSS/BLUE SHIELD | Admitting: Internal Medicine

## 2016-06-24 DIAGNOSIS — R059 Cough, unspecified: Secondary | ICD-10-CM

## 2016-06-24 DIAGNOSIS — R05 Cough: Secondary | ICD-10-CM | POA: Diagnosis not present

## 2016-06-24 DIAGNOSIS — J4530 Mild persistent asthma, uncomplicated: Secondary | ICD-10-CM

## 2016-06-24 LAB — PULMONARY FUNCTION TEST
DL/VA % pred: 120 %
DL/VA: 5.52 ml/min/mmHg/L
DLCO COR % PRED: 104 %
DLCO UNC % PRED: 103 %
DLCO UNC: 32.01 ml/min/mmHg
DLCO cor: 32.38 ml/min/mmHg
FEF 25-75 POST: 5.54 L/s
FEF 25-75 PRE: 5 L/s
FEF2575-%Change-Post: 10 %
FEF2575-%PRED-POST: 152 %
FEF2575-%Pred-Pre: 137 %
FEV1-%Change-Post: 1 %
FEV1-%PRED-POST: 100 %
FEV1-%Pred-Pre: 99 %
FEV1-POST: 3.98 L
FEV1-PRE: 3.93 L
FEV1FVC-%Change-Post: 1 %
FEV1FVC-%PRED-PRE: 107 %
FEV6-%CHANGE-POST: 0 %
FEV6-%PRED-POST: 94 %
FEV6-%Pred-Pre: 94 %
FEV6-POST: 4.62 L
FEV6-Pre: 4.6 L
FEV6FVC-%CHANGE-POST: 0 %
FEV6FVC-%PRED-POST: 102 %
FEV6FVC-%Pred-Pre: 102 %
FVC-%CHANGE-POST: 0 %
FVC-%Pred-Post: 92 %
FVC-%Pred-Pre: 92 %
FVC-Post: 4.63 L
FVC-Pre: 4.64 L
POST FEV1/FVC RATIO: 86 %
Post FEV6/FVC ratio: 100 %
Pre FEV1/FVC ratio: 85 %
Pre FEV6/FVC Ratio: 99 %
RV % PRED: 77 %
RV: 1.45 L
TLC % PRED: 89 %
TLC: 6 L

## 2016-06-24 NOTE — Progress Notes (Signed)
PFT done today. 

## 2016-06-25 ENCOUNTER — Encounter: Payer: Self-pay | Admitting: Internal Medicine

## 2016-07-07 DIAGNOSIS — G4733 Obstructive sleep apnea (adult) (pediatric): Secondary | ICD-10-CM | POA: Diagnosis not present

## 2016-08-05 ENCOUNTER — Other Ambulatory Visit: Payer: Self-pay | Admitting: Internal Medicine

## 2016-08-06 DIAGNOSIS — G4733 Obstructive sleep apnea (adult) (pediatric): Secondary | ICD-10-CM | POA: Diagnosis not present

## 2016-08-20 ENCOUNTER — Other Ambulatory Visit: Payer: Self-pay | Admitting: Internal Medicine

## 2016-09-06 DIAGNOSIS — G4733 Obstructive sleep apnea (adult) (pediatric): Secondary | ICD-10-CM | POA: Diagnosis not present

## 2016-09-28 ENCOUNTER — Ambulatory Visit (INDEPENDENT_AMBULATORY_CARE_PROVIDER_SITE_OTHER)
Admission: RE | Admit: 2016-09-28 | Discharge: 2016-09-28 | Disposition: A | Discharge status: Home / Self Care | Payer: BLUE CROSS/BLUE SHIELD | Source: Ambulatory Visit | Attending: Internal Medicine | Admitting: Internal Medicine

## 2016-09-28 ENCOUNTER — Other Ambulatory Visit (INDEPENDENT_AMBULATORY_CARE_PROVIDER_SITE_OTHER): Payer: BLUE CROSS/BLUE SHIELD

## 2016-09-28 ENCOUNTER — Encounter: Payer: Self-pay | Admitting: Internal Medicine

## 2016-09-28 ENCOUNTER — Ambulatory Visit (INDEPENDENT_AMBULATORY_CARE_PROVIDER_SITE_OTHER): Payer: BLUE CROSS/BLUE SHIELD | Admitting: Internal Medicine

## 2016-09-28 VITALS — BP 116/74 | HR 86 | Temp 98.1°F | Resp 16 | Wt >= 6400 oz

## 2016-09-28 DIAGNOSIS — M545 Low back pain: Secondary | ICD-10-CM | POA: Diagnosis not present

## 2016-09-28 DIAGNOSIS — M25551 Pain in right hip: Secondary | ICD-10-CM

## 2016-09-28 DIAGNOSIS — E118 Type 2 diabetes mellitus with unspecified complications: Secondary | ICD-10-CM | POA: Diagnosis not present

## 2016-09-28 DIAGNOSIS — K219 Gastro-esophageal reflux disease without esophagitis: Secondary | ICD-10-CM

## 2016-09-28 DIAGNOSIS — I10 Essential (primary) hypertension: Secondary | ICD-10-CM | POA: Diagnosis not present

## 2016-09-28 DIAGNOSIS — G8929 Other chronic pain: Secondary | ICD-10-CM

## 2016-09-28 DIAGNOSIS — M5431 Sciatica, right side: Secondary | ICD-10-CM

## 2016-09-28 DIAGNOSIS — M4854XS Collapsed vertebra, not elsewhere classified, thoracic region, sequela of fracture: Secondary | ICD-10-CM

## 2016-09-28 DIAGNOSIS — M1990 Unspecified osteoarthritis, unspecified site: Secondary | ICD-10-CM | POA: Insufficient documentation

## 2016-09-28 LAB — CBC WITH DIFFERENTIAL/PLATELET
BASOS ABS: 0 10*3/uL (ref 0.0–0.1)
Basophils Relative: 0.3 % (ref 0.0–3.0)
EOS ABS: 0.2 10*3/uL (ref 0.0–0.7)
Eosinophils Relative: 2 % (ref 0.0–5.0)
HCT: 47.5 % (ref 39.0–52.0)
Hemoglobin: 15.7 g/dL (ref 13.0–17.0)
LYMPHS ABS: 2.3 10*3/uL (ref 0.7–4.0)
Lymphocytes Relative: 24 % (ref 12.0–46.0)
MCHC: 33 g/dL (ref 30.0–36.0)
MCV: 88.7 fl (ref 78.0–100.0)
MONO ABS: 0.8 10*3/uL (ref 0.1–1.0)
MONOS PCT: 7.9 % (ref 3.0–12.0)
NEUTROS PCT: 65.8 % (ref 43.0–77.0)
Neutro Abs: 6.3 10*3/uL (ref 1.4–7.7)
PLATELETS: 257 10*3/uL (ref 150.0–400.0)
RBC: 5.35 Mil/uL (ref 4.22–5.81)
RDW: 15.1 % (ref 11.5–15.5)
WBC: 9.5 10*3/uL (ref 4.0–10.5)

## 2016-09-28 LAB — HEMOGLOBIN A1C: Hgb A1c MFr Bld: 6.5 % (ref 4.6–6.5)

## 2016-09-28 MED ORDER — ETODOLAC ER 500 MG PO TB24: 500.0000 mg | ORAL_TABLET | Freq: Every day | ORAL | 1 refills | Status: DC

## 2016-09-28 MED ORDER — METHOCARBAMOL 750 MG PO TABS: 750.0000 mg | ORAL_TABLET | Freq: Four times a day (QID) | ORAL | 1 refills | Status: DC | PRN

## 2016-09-28 NOTE — Progress Notes (Signed)
Subjective:  Patient ID: Joseph Wood, male    DOB: [birth year]  Age: 45 y.o. MRN: [id number]  CC: Back Pain   HPI Joseph Wood presents for concerns about worsening pain in his right back, right hip, and right lower extremity. He said this is been occurring intermittently for about 2 years but says it has been worsening over the last 2 months. He was seen earlier this year by another provider for back pain and plain films were done that showed some deformities in T11 or T12. He doesn't recall any specific trauma or injury. He describes the pain as an intermittent sharp, stabbing, and burning pain. It radiates all the way down his right leg into his right ankle. He denies numbness, weakness, tingling in his lower extremities and denies bowel or bladder incontinence or retention. He has tried he gets symptom relief with Tylenol, Motrin, and the occasional dose of Flexeril with moderate symptom relief.  Outpatient Medications Prior to Visit  Medication Sig Dispense Refill  . aspirin EC 81 MG tablet Take 1 tablet (81 mg total) by mouth daily. 90 tablet 3  . atorvastatin (LIPITOR) 10 MG tablet Take 1 tablet (10 mg total) by mouth daily. 90 tablet 3  . Blood Glucose Monitoring Suppl (BAYER CONTOUR NEXT MONITOR) w/Device KIT Use to check blood sugar DX E11.8 1 kit 0  . fluticasone furoate-vilanterol (BREO ELLIPTA) 200-25 MCG/INH AEPB Inhale 1 puff into the lungs daily. 30 each 11  . glucose blood (BAYER CONTOUR NEXT TEST) test strip Use as instructed DX E11.8 100 each 4  . INVOKANA 300 MG TABS tablet TAKE ONE TABLET BY MOUTH ONCE DAILY BEFORE BREAKFAST 90 tablet 1  . Lancets (ONETOUCH ULTRASOFT) lancets Test up to TID 100 each 12  . metFORMIN (GLUCOPHAGE) 500 MG tablet TAKE ONE TABLET BY MOUTH TWICE DAILY WITH A MEAL 180 tablet 1  . omeprazole (PRILOSEC) 20 MG capsule Take 1 capsule (20 mg total) by mouth daily. 90 capsule 3  . valsartan (DIOVAN) 160 MG tablet TAKE ONE TABLET BY MOUTH ONCE  DAILY 90 tablet 1  . cyclobenzaprine (FLEXERIL) 5 MG tablet Take 1 tablet (5 mg total) by mouth 3 (three) times daily as needed for muscle spasms. 60 tablet 1  . acetaminophen (TYLENOL) 500 MG tablet Take 500 mg by mouth every 6 (six) hours as needed for mild pain, moderate pain or headache.    . Phentermine-Topiramate (QSYMIA) 3.75-23 MG CP24 Take 1 capsule by mouth daily. 14 capsule 0  . Phentermine-Topiramate (QSYMIA) 7.5-46 MG CP24 Take 1 capsule by mouth daily. 30 capsule 2   No facility-administered medications prior to visit.     ROS Review of Systems  Constitutional: Negative.  Negative for activity change, appetite change, chills, fatigue and fever.  HENT: Negative.  Negative for trouble swallowing.   Eyes: Negative.  Negative for visual disturbance.  Respiratory: Negative.  Negative for cough, choking, chest tightness, shortness of breath and stridor.   Cardiovascular: Negative.  Negative for chest pain, palpitations and leg swelling.  Gastrointestinal: Negative.  Negative for abdominal pain, constipation, diarrhea, nausea and vomiting.  Endocrine: Negative.   Genitourinary: Negative.  Negative for difficulty urinating.  Musculoskeletal: Positive for arthralgias and back pain. Negative for gait problem, joint swelling, myalgias and neck pain.       He complains of right hip, right knee, and right ankle pain  Skin: Negative.  Negative for color change and rash.  Allergic/Immunologic: Negative.   Neurological: Negative.  Negative  for dizziness, tremors, weakness, numbness and headaches.  Hematological: Negative.  Negative for adenopathy. Does not bruise/bleed easily.  Psychiatric/Behavioral: Negative.     Objective:  BP 116/74   Pulse 86   Temp 98.1 F (36.7 C) (Oral)   Resp 16   Wt (!) 402 lb (182.3 kg)   SpO2 93%   BMI 57.68 kg/m   BP Readings from Last 3 Encounters:  09/28/16 116/74  04/06/16 118/80  02/06/16 118/82    Wt Readings from Last 3 Encounters:    09/28/16 (!) 402 lb (182.3 kg)  04/06/16 (!) 396 lb (179.6 kg)  02/06/16 (!) 386 lb (175.1 kg)    Physical Exam  Constitutional: He is oriented to person, place, and time. No distress.  HENT:  Mouth/Throat: Oropharynx is clear and moist. No oropharyngeal exudate.  Eyes: Conjunctivae are normal. Right eye exhibits no discharge. Left eye exhibits no discharge. No scleral icterus.  Neck: Normal range of motion. Neck supple. No JVD present. No tracheal deviation present. No thyromegaly present.  Cardiovascular: Normal rate, regular rhythm, normal heart sounds and intact distal pulses.  Exam reveals no gallop and no friction rub.   No murmur heard. Pulmonary/Chest: Effort normal and breath sounds normal. No stridor. No respiratory distress. He has no wheezes. He has no rales. He exhibits no tenderness.  Abdominal: Soft. Bowel sounds are normal. He exhibits no distension and no mass. There is no tenderness. There is no rebound and no guarding.  Musculoskeletal: Normal range of motion. He exhibits no edema, tenderness or deformity.  Lymphadenopathy:    He has no cervical adenopathy.  Neurological: He is alert and oriented to person, place, and time. He has normal strength. He displays no atrophy, no tremor and normal reflexes. No cranial nerve deficit or sensory deficit. He exhibits normal muscle tone. He displays a negative Romberg sign. He displays no seizure activity. Coordination and gait normal.  Reflex Scores:      Tricep reflexes are 1+ on the right side and 1+ on the left side.      Bicep reflexes are 1+ on the right side and 1+ on the left side.      Brachioradialis reflexes are 1+ on the right side and 1+ on the left side.      Patellar reflexes are 1+ on the right side and 1+ on the left side.      Achilles reflexes are 0 on the right side and 0 on the left side. NEG SLR in BLE  Skin: Skin is warm and dry. No rash noted. He is not diaphoretic. No erythema. No pallor.  Vitals  reviewed.   Lab Results  Component Value Date   WBC 9.5 09/28/2016   HGB 15.7 09/28/2016   HCT 47.5 09/28/2016   PLT 257.0 09/28/2016   GLUCOSE 124 (H) 09/28/2016   CHOL 169 04/06/2016   TRIG 160.0 (H) 04/06/2016   HDL 48.00 04/06/2016   LDLCALC 89 04/06/2016   ALT 25 09/28/2016   AST 19 09/28/2016   NA 143 09/28/2016   K 3.7 09/28/2016   CL 107 09/28/2016   CREATININE 1.03 09/28/2016   BUN 16 09/28/2016   CO2 28 09/28/2016   TSH 1.58 04/06/2016   PSA 1.01 06/23/2015   HGBA1C 6.5 09/28/2016   MICROALBUR <0.7 04/06/2016    Dg Lumbar Spine Complete  Result Date: 02/06/2016 CLINICAL DATA:  Chronic low back pain and right leg pain. EXAM: LUMBAR SPINE - COMPLETE 4+ VIEW COMPARISON:  CT scan 07/29/2015.  FINDINGS: Normal alignment of the lumbar vertebral bodies. Disc spaces and vertebral bodies are maintained. Stable mild wedging and degenerative changes involving the lower thoracic vertebral bodies. The facets are normally aligned. No pars defects. The visualized bony pelvis is intact. The SI joints appear normal. IMPRESSION: Normal alignment and no acute bony findings. Mild degenerative changes. Electronically Signed   By: Marijo Sanes M.D.   On: 02/06/2016 14:40    Assessment & Plan:   Nathan was seen today for back pain.  Diagnoses and all orders for this visit:  Type 2 diabetes mellitus with complication, without long-term current use of insulin ([place])- His A1c is 6.5%, blood sugars are adequately well controlled. -     Comprehensive metabolic panel; Future -     Hemoglobin A1c; Future  HYPERTENSION, BENIGN ESSENTIAL- his blood pressures well controlled, electrolytes and renal function are stable. -     Comprehensive metabolic panel; Future  Gastroesophageal reflux disease without esophagitis -     CBC with Differential/Platelet; Future  Right low back pain, unspecified chronicity, with sciatica presence unspecified- repeat plain films show wedge-shaped deformities at  T11-T12, I have ordered an MRI of the thoracic and lumbar spine to screen for compromise of the spinal cord and nerve impingement, will  Also try to identify if these are vertebral fractures or if there is concern for infection such as discitis or metastatic disease. -     Ambulatory referral to Physical Therapy -     DG Lumbar Spine Complete; Future -     etodolac (LODINE XL) 500 MG 24 hr tablet; Take 1 tablet (500 mg total) by mouth daily. -     methocarbamol (ROBAXIN) 750 MG tablet; Take 1 tablet (750 mg total) by mouth every 6 (six) hours as needed for muscle spasms. -     MR Thoracic Spine Wo Contrast; Future -     MR LUMBAR SPINE WO CONTRAST; Future  Chronic right hip pain- his plain films show spurring in the right hip so I have asked him to see orthopedics to see if there is a surgical option to relieve his discomfort. -     DG HIP UNILAT WITH PELVIS 2-3 VIEWS RIGHT; Future -     Ambulatory referral to Physical Therapy -     etodolac (LODINE XL) 500 MG 24 hr tablet; Take 1 tablet (500 mg total) by mouth daily. -     Ambulatory referral to Orthopedic Surgery  Sciatica of right side- will control the pain with Lodine and Robaxin. I've asked him to see physical therapy to start modalities to relieve his discomfort, will also get an MRI of the thoracic and lumbar spine to see if his pain is related to a thoracic or lumbar radiculitis. -     Ambulatory referral to Physical Therapy -     etodolac (LODINE XL) 500 MG 24 hr tablet; Take 1 tablet (500 mg total) by mouth daily. -     methocarbamol (ROBAXIN) 750 MG tablet; Take 1 tablet (750 mg total) by mouth every 6 (six) hours as needed for muscle spasms. -     MR Thoracic Spine Wo Contrast; Future -     MR LUMBAR SPINE WO CONTRAST; Future -     Ambulatory referral to Orthopedic Surgery  Thoracic vertebral collapse, sequela- as above -     MR Thoracic Spine Wo Contrast; Future -     MR LUMBAR SPINE WO CONTRAST; Future   I have discontinued  Mr. Bredeson  acetaminophen, cyclobenzaprine, Phentermine-Topiramate, and Phentermine-Topiramate. I am also having him start on etodolac and methocarbamol. Additionally, I am having him maintain his aspirin EC, onetouch ultrasoft, omeprazole, BAYER CONTOUR NEXT MONITOR, glucose blood, valsartan, fluticasone furoate-vilanterol, atorvastatin, metFORMIN, and INVOKANA.  Meds ordered this encounter  Medications  . etodolac (LODINE XL) 500 MG 24 hr tablet    Sig: Take 1 tablet (500 mg total) by mouth daily.    Dispense:  90 tablet    Refill:  1  . methocarbamol (ROBAXIN) 750 MG tablet    Sig: Take 1 tablet (750 mg total) by mouth every 6 (six) hours as needed for muscle spasms.    Dispense:  270 tablet    Refill:  1     Follow-up: Return in about 2 months (around 11/28/2016).  Scarlette Calico, MD

## 2016-09-28 NOTE — Patient Instructions (Signed)

## 2016-09-29 DIAGNOSIS — M4854XS Collapsed vertebra, not elsewhere classified, thoracic region, sequela of fracture: Secondary | ICD-10-CM | POA: Insufficient documentation

## 2016-09-29 LAB — COMPREHENSIVE METABOLIC PANEL
ALK PHOS: 69 U/L (ref 39–117)
ALT: 25 U/L (ref 0–53)
AST: 19 U/L (ref 0–37)
Albumin: 4.3 g/dL (ref 3.5–5.2)
BILIRUBIN TOTAL: 0.4 mg/dL (ref 0.2–1.2)
BUN: 16 mg/dL (ref 6–23)
CO2: 28 meq/L (ref 19–32)
CREATININE: 1.03 mg/dL (ref 0.40–1.50)
Calcium: 9.5 mg/dL (ref 8.4–10.5)
Chloride: 107 mEq/L (ref 96–112)
GFR: 82.9 mL/min (ref >60.00)
GLUCOSE: 124 mg/dL — AB (ref 70–99)
Potassium: 3.7 mEq/L (ref 3.5–5.1)
SODIUM: 143 meq/L (ref 135–145)
TOTAL PROTEIN: 7.5 g/dL (ref 6.0–8.3)

## 2016-10-06 DIAGNOSIS — G4733 Obstructive sleep apnea (adult) (pediatric): Secondary | ICD-10-CM | POA: Diagnosis not present

## 2016-10-10 ENCOUNTER — Ambulatory Visit
Admission: RE | Admit: 2016-10-10 | Discharge: 2016-10-10 | Disposition: A | Discharge status: Home / Self Care | Payer: BLUE CROSS/BLUE SHIELD | Source: Ambulatory Visit | Attending: Internal Medicine | Admitting: Internal Medicine

## 2016-10-10 DIAGNOSIS — M4854XS Collapsed vertebra, not elsewhere classified, thoracic region, sequela of fracture: Secondary | ICD-10-CM

## 2016-10-10 DIAGNOSIS — M5126 Other intervertebral disc displacement, lumbar region: Secondary | ICD-10-CM | POA: Diagnosis not present

## 2016-10-10 DIAGNOSIS — M5431 Sciatica, right side: Secondary | ICD-10-CM

## 2016-10-10 DIAGNOSIS — M545 Low back pain: Secondary | ICD-10-CM

## 2016-10-10 DIAGNOSIS — M5124 Other intervertebral disc displacement, thoracic region: Secondary | ICD-10-CM | POA: Diagnosis not present

## 2016-10-11 ENCOUNTER — Other Ambulatory Visit: Payer: Self-pay | Admitting: Internal Medicine

## 2016-10-11 ENCOUNTER — Encounter: Payer: Self-pay | Admitting: Internal Medicine

## 2016-10-11 DIAGNOSIS — M48061 Spinal stenosis, lumbar region without neurogenic claudication: Secondary | ICD-10-CM

## 2016-10-11 DIAGNOSIS — M25551 Pain in right hip: Secondary | ICD-10-CM | POA: Diagnosis not present

## 2016-10-20 ENCOUNTER — Encounter: Payer: Self-pay | Admitting: Internal Medicine

## 2016-10-20 ENCOUNTER — Ambulatory Visit (INDEPENDENT_AMBULATORY_CARE_PROVIDER_SITE_OTHER): Payer: BLUE CROSS/BLUE SHIELD | Admitting: Internal Medicine

## 2016-10-20 DIAGNOSIS — M8949 Other hypertrophic osteoarthropathy, multiple sites: Secondary | ICD-10-CM

## 2016-10-20 DIAGNOSIS — M150 Primary generalized (osteo)arthritis: Secondary | ICD-10-CM | POA: Diagnosis not present

## 2016-10-20 DIAGNOSIS — G8929 Other chronic pain: Secondary | ICD-10-CM | POA: Diagnosis not present

## 2016-10-20 DIAGNOSIS — M545 Low back pain: Secondary | ICD-10-CM | POA: Diagnosis not present

## 2016-10-20 DIAGNOSIS — M159 Polyosteoarthritis, unspecified: Secondary | ICD-10-CM

## 2016-10-20 MED ORDER — MELOXICAM 15 MG PO TABS: 15.0000 mg | ORAL_TABLET | Freq: Every day | ORAL | 1 refills | Status: DC

## 2016-10-20 NOTE — Progress Notes (Signed)
Subjective:  Patient ID: Joseph Wood, male    DOB: January 15, 1971  Age: 45 y.o. MRN: [id number]  CC: Osteoarthritis and Back Pain   HPI [place] presents for advice about weight loss options and chronic LBP and knee pain. He has had an MRI of his lumbar spine which showed some congenital deformities in addition to DDD and mild lumbar spinal stenosis. He is not interested in receiving epidural steroid injections or having surgery. He was prescribed etodolac but has not been taking it because he thought it side effects ("foggy brain"). Instead he has been controlling his pain with over-the-counter doses of Motrin.  Outpatient Medications Prior to Visit  Medication Sig Dispense Refill  . aspirin EC 81 MG tablet Take 1 tablet (81 mg total) by mouth daily. 90 tablet 3  . atorvastatin (LIPITOR) 10 MG tablet Take 1 tablet (10 mg total) by mouth daily. 90 tablet 3  . Blood Glucose Monitoring Suppl (BAYER CONTOUR NEXT MONITOR) w/Device KIT Use to check blood sugar DX E11.8 1 kit 0  . fluticasone furoate-vilanterol (BREO ELLIPTA) 200-25 MCG/INH AEPB Inhale 1 puff into the lungs daily. 30 each 11  . glucose blood (BAYER CONTOUR NEXT TEST) test strip Use as instructed DX E11.8 100 each 4  . INVOKANA 300 MG TABS tablet TAKE ONE TABLET BY MOUTH ONCE DAILY BEFORE BREAKFAST 90 tablet 1  . Lancets (ONETOUCH ULTRASOFT) lancets Test up to TID 100 each 12  . metFORMIN (GLUCOPHAGE) 500 MG tablet TAKE ONE TABLET BY MOUTH TWICE DAILY WITH A MEAL 180 tablet 1  . methocarbamol (ROBAXIN) 750 MG tablet Take 1 tablet (750 mg total) by mouth every 6 (six) hours as needed for muscle spasms. 270 tablet 1  . omeprazole (PRILOSEC) 20 MG capsule Take 1 capsule (20 mg total) by mouth daily. 90 capsule 3  . valsartan (DIOVAN) 160 MG tablet TAKE ONE TABLET BY MOUTH ONCE DAILY 90 tablet 1  . etodolac (LODINE XL) 500 MG 24 hr tablet Take 1 tablet (500 mg total) by mouth daily. 90 tablet 1   No facility-administered  medications prior to visit.     ROS Review of Systems  Constitutional: Negative for appetite change, chills, diaphoresis, fatigue and fever.  HENT: Negative.  Negative for trouble swallowing.   Eyes: Negative for photophobia and visual disturbance.  Respiratory: Negative.  Negative for cough, choking, chest tightness, shortness of breath and stridor.   Cardiovascular: Negative for chest pain, palpitations and leg swelling.  Gastrointestinal: Negative.  Negative for abdominal pain, constipation, diarrhea, nausea and vomiting.  Endocrine: Negative.   Genitourinary: Negative.  Negative for difficulty urinating and urgency.  Musculoskeletal: Positive for arthralgias and back pain. Negative for gait problem, joint swelling, myalgias and neck pain.  Skin: Negative.   Allergic/Immunologic: Negative.   Neurological: Negative.  Negative for weakness, light-headedness and numbness.  Hematological: Negative.  Negative for adenopathy. Does not bruise/bleed easily.  Psychiatric/Behavioral: Negative.     Objective:  BP 122/68 (BP Location: Left Arm, Patient Position: Sitting, Cuff Size: Large)   Pulse 86   Temp 97.8 F (36.6 C) (Oral)   Resp 16   Ht _0  (1.778 m)   Wt (!) 394 lb 7 oz (178.9 kg)   SpO2 96%   BMI 56.60 kg/m   BP Readings from Last 3 Encounters:  10/20/16 122/68  09/28/16 116/74  04/06/16 118/80    Wt Readings from Last 3 Encounters:  10/20/16 (!) 394 lb 7 oz (178.9 kg)  09/28/16 Marland Kitchen)  402 lb (182.3 kg)  04/06/16 (!) 396 lb (179.6 kg)    Physical Exam  Constitutional: He is oriented to person, place, and time. No distress.  HENT:  Mouth/Throat: Oropharynx is clear and moist. No oropharyngeal exudate.  Eyes: Conjunctivae are normal. Right eye exhibits no discharge. Left eye exhibits no discharge. No scleral icterus.  Neck: Normal range of motion. Neck supple. No JVD present. No tracheal deviation present. No thyromegaly present.  Cardiovascular: Normal rate, regular  rhythm, normal heart sounds and intact distal pulses.  Exam reveals no gallop and no friction rub.   No murmur heard. Pulmonary/Chest: Effort normal and breath sounds normal. No stridor. No respiratory distress. He has no wheezes. He has no rales. He exhibits no tenderness.  Abdominal: Soft. Bowel sounds are normal. He exhibits no distension and no mass. There is no tenderness. There is no rebound and no guarding.  Musculoskeletal: Normal range of motion. He exhibits no edema or tenderness.       Right knee: He exhibits deformity (DJD and crepitance). He exhibits normal range of motion, no swelling and no effusion. No tenderness found.       Left knee: He exhibits deformity (DJD). He exhibits normal range of motion, no swelling and no effusion. No tenderness found.  Lymphadenopathy:    He has no cervical adenopathy.  Neurological: He is oriented to person, place, and time.  Skin: Skin is warm and dry. No rash noted. He is not diaphoretic. No erythema. No pallor.  Vitals reviewed.   Lab Results  Component Value Date   WBC 9.5 09/28/2016   HGB 15.7 09/28/2016   HCT 47.5 09/28/2016   PLT 257.0 09/28/2016   GLUCOSE 124 (H) 09/28/2016   CHOL 169 04/06/2016   TRIG 160.0 (H) 04/06/2016   HDL 48.00 04/06/2016   LDLCALC 89 04/06/2016   ALT 25 09/28/2016   AST 19 09/28/2016   NA 143 09/28/2016   K 3.7 09/28/2016   CL 107 09/28/2016   CREATININE 1.03 09/28/2016   BUN 16 09/28/2016   CO2 28 09/28/2016   TSH 1.58 04/06/2016   PSA 1.01 06/23/2015   HGBA1C 6.5 09/28/2016   MICROALBUR <0.7 04/06/2016    Mr Thoracic Spine Wo Contrast  Result Date: 10/10/2016 CLINICAL DATA:  Right lower back pain. Right-sided sciatica. Thoracic vertebral collapse, sequela EXAM: MRI THORACIC AND LUMBAR SPINE WITHOUT CONTRAST TECHNIQUE: Multiplanar and multiecho pulse sequences of the thoracic and lumbar spine were obtained without intravenous contrast. COMPARISON:  Lumbar radiographs 09/18/2016 FINDINGS: MRI  THORACIC SPINE FINDINGS Alignment: Normal alignment. Thoracic kyphosis at T10-11 measuring approximately 35 degrees. Vertebrae: Schmorl's nodes are present from T6 through T12. Mild anterior wedging at T8, T9, T10, T11, and T12. Normal bone marrow. This appears chronic and may be related to Scheuermann's disease. No significant fracture identified. No worrisome bone marrow lesion identified. Cord:  Normal signal and morphology. Paraspinal and other soft tissues: Paraspinous soft tissues normal. No pleural effusion. Disc levels: Disc degeneration and Schmorl's nodes from T6 through T12. Small central disc protrusion at T5-6 and T6-7 on the right. No significant spinal stenosis. No cord compression. MRI LUMBAR SPINE FINDINGS Segmentation:  Normal Alignment:  Normal Vertebrae: Negative for fracture or mass. No worrisome bone marrow lesion identified. Conus medullaris: Extends to the L1-2 level and appears normal. Paraspinal and other soft tissues: Negative Disc levels: L1-2:  Small left-sided disc protrusion without stenosis L2-3: Mild disc degeneration and disc bulging with Schmorl's nodes. No significant stenosis L3-4: Small central  disc protrusion. Diffuse bulging of the disc. No significant canal stenosis. L4-5: Disc degeneration with diffuse bulging of the disc and mild endplate spurring. Small central disc protrusion. No significant canal stenosis. L5-S1: Mild disc degeneration. Bilateral facet degeneration causing mild foraminal narrowing bilaterally. IMPRESSION: MR THORACIC SPINE IMPRESSION Lower thoracic kyphosis. Multiple levels of Schmorl's nodes and vertebral body wedging T8 through T12 most consistent with Scheuermann's disease. No acute fracture or mass. No significant thoracic spinal stenosis MR LUMBAR SPINE IMPRESSION Mild degenerative changes in the lumbar spine. Mild foraminal narrowing bilaterally L5-S1 due to facet hypertrophy. Electronically Signed   By: Franchot Gallo M.D.   On: 10/10/2016 15:31     Mr Lumbar Spine Wo Contrast  Result Date: 10/10/2016 CLINICAL DATA:  Right lower back pain. Right-sided sciatica. Thoracic vertebral collapse, sequela EXAM: MRI THORACIC AND LUMBAR SPINE WITHOUT CONTRAST TECHNIQUE: Multiplanar and multiecho pulse sequences of the thoracic and lumbar spine were obtained without intravenous contrast. COMPARISON:  Lumbar radiographs 09/18/2016 FINDINGS: MRI THORACIC SPINE FINDINGS Alignment: Normal alignment. Thoracic kyphosis at T10-11 measuring approximately 35 degrees. Vertebrae: Schmorl's nodes are present from T6 through T12. Mild anterior wedging at T8, T9, T10, T11, and T12. Normal bone marrow. This appears chronic and may be related to Scheuermann's disease. No significant fracture identified. No worrisome bone marrow lesion identified. Cord:  Normal signal and morphology. Paraspinal and other soft tissues: Paraspinous soft tissues normal. No pleural effusion. Disc levels: Disc degeneration and Schmorl's nodes from T6 through T12. Small central disc protrusion at T5-6 and T6-7 on the right. No significant spinal stenosis. No cord compression. MRI LUMBAR SPINE FINDINGS Segmentation:  Normal Alignment:  Normal Vertebrae: Negative for fracture or mass. No worrisome bone marrow lesion identified. Conus medullaris: Extends to the L1-2 level and appears normal. Paraspinal and other soft tissues: Negative Disc levels: L1-2:  Small left-sided disc protrusion without stenosis L2-3: Mild disc degeneration and disc bulging with Schmorl's nodes. No significant stenosis L3-4: Small central disc protrusion. Diffuse bulging of the disc. No significant canal stenosis. L4-5: Disc degeneration with diffuse bulging of the disc and mild endplate spurring. Small central disc protrusion. No significant canal stenosis. L5-S1: Mild disc degeneration. Bilateral facet degeneration causing mild foraminal narrowing bilaterally. IMPRESSION: MR THORACIC SPINE IMPRESSION Lower thoracic kyphosis.  Multiple levels of Schmorl's nodes and vertebral body wedging T8 through T12 most consistent with Scheuermann's disease. No acute fracture or mass. No significant thoracic spinal stenosis MR LUMBAR SPINE IMPRESSION Mild degenerative changes in the lumbar spine. Mild foraminal narrowing bilaterally L5-S1 due to facet hypertrophy. Electronically Signed   By: Franchot Gallo M.D.   On: 10/10/2016 15:31    Assessment & Plan:   Kedron was seen today for osteoarthritis and back pain.  Diagnoses and all orders for this visit:  Obesity, morbid, BMI 50 or higher ([place])- I think his best option is to consider bariatric surgery -     Ambulatory referral to General Surgery  Chronic right-sided low back pain, with sciatica presence unspecified -     meloxicam (MOBIC) 15 MG tablet; Take 1 tablet (15 mg total) by mouth daily.  Primary osteoarthritis involving multiple joints -     meloxicam (MOBIC) 15 MG tablet; Take 1 tablet (15 mg total) by mouth daily.   I have discontinued Mr. Keltner etodolac. I am also having him start on meloxicam. Additionally, I am having him maintain his aspirin EC, onetouch ultrasoft, omeprazole, BAYER CONTOUR NEXT MONITOR, glucose blood, valsartan, fluticasone furoate-vilanterol, atorvastatin, metFORMIN, INVOKANA, and  methocarbamol.  Meds ordered this encounter  Medications  . meloxicam (MOBIC) 15 MG tablet    Sig: Take 1 tablet (15 mg total) by mouth daily.    Dispense:  90 tablet    Refill:  1     Follow-up: Return in about 3 months (around 01/20/2017).  Scarlette Calico, MD

## 2016-10-20 NOTE — Patient Instructions (Signed)

## 2016-10-20 NOTE — Progress Notes (Signed)
Pre visit review using our clinic review tool, if applicable. No additional management support is needed unless otherwise documented below in the visit note. 

## 2016-10-26 ENCOUNTER — Other Ambulatory Visit: Payer: Self-pay | Admitting: Internal Medicine

## 2016-10-26 DIAGNOSIS — K219 Gastro-esophageal reflux disease without esophagitis: Secondary | ICD-10-CM

## 2016-11-05 DIAGNOSIS — G4733 Obstructive sleep apnea (adult) (pediatric): Secondary | ICD-10-CM | POA: Diagnosis not present

## 2016-11-08 ENCOUNTER — Telehealth: Payer: Self-pay

## 2016-11-08 DIAGNOSIS — H524 Presbyopia: Secondary | ICD-10-CM | POA: Diagnosis not present

## 2016-11-08 DIAGNOSIS — M549 Dorsalgia, unspecified: Secondary | ICD-10-CM | POA: Diagnosis not present

## 2016-11-08 DIAGNOSIS — M5431 Sciatica, right side: Secondary | ICD-10-CM | POA: Diagnosis not present

## 2016-11-08 DIAGNOSIS — M4716 Other spondylosis with myelopathy, lumbar region: Secondary | ICD-10-CM | POA: Diagnosis not present

## 2016-11-08 DIAGNOSIS — M546 Pain in thoracic spine: Secondary | ICD-10-CM | POA: Diagnosis not present

## 2016-11-08 DIAGNOSIS — M5136 Other intervertebral disc degeneration, lumbar region: Secondary | ICD-10-CM | POA: Diagnosis not present

## 2016-11-08 LAB — HM DIABETES EYE EXAM

## 2016-11-08 MED ORDER — ONETOUCH ULTRASOFT LANCETS MISC: 3 refills | Status: DC

## 2016-11-08 NOTE — Telephone Encounter (Signed)
Lancet refill sent.

## 2016-11-09 ENCOUNTER — Other Ambulatory Visit: Payer: Self-pay | Admitting: Internal Medicine

## 2016-11-18 ENCOUNTER — Encounter: Payer: Self-pay | Admitting: Internal Medicine

## 2016-11-18 NOTE — Progress Notes (Signed)
Abstracted and sent to scan  

## 2016-12-01 ENCOUNTER — Ambulatory Visit (INDEPENDENT_AMBULATORY_CARE_PROVIDER_SITE_OTHER): Payer: BLUE CROSS/BLUE SHIELD | Admitting: Internal Medicine

## 2016-12-01 ENCOUNTER — Encounter: Payer: Self-pay | Admitting: Internal Medicine

## 2016-12-01 VITALS — BP 120/64 | HR 98 | Temp 98.3°F | Resp 16 | Ht 70.0 in | Wt 399.0 lb

## 2016-12-01 DIAGNOSIS — G5793 Unspecified mononeuropathy of bilateral lower limbs: Secondary | ICD-10-CM | POA: Diagnosis not present

## 2016-12-01 MED ORDER — PREGABALIN 50 MG PO CAPS: 50.0000 mg | ORAL_CAPSULE | Freq: Three times a day (TID) | ORAL | 5 refills | Status: DC

## 2016-12-01 NOTE — Patient Instructions (Signed)
Diabetic Neuropathy Diabetic neuropathy is a nerve disease or nerve damage that is caused by diabetes mellitus. About half of all people with diabetes mellitus have some form of nerve damage. Nerve damage is more common in those who have had diabetes mellitus for many years and who generally have not had good control of their blood sugar (glucose) level. Diabetic neuropathy is a common complication of diabetes mellitus. There are three common types of diabetic neuropathy and a fourth type that is less common and less understood:  Peripheral neuropathy-This is the most common type of diabetic neuropathy. It causes damage to the nerves of the feet and legs first and then eventually the hands and arms. The damage affects the ability to sense touch.  Autonomic neuropathy-This type causes damage to the autonomic nervous system, which controls the following functions: ? Heartbeat. ? Body temperature. ? Blood pressure. ? Urination. ? Digestion. ? Sweating. ? Sexual function.  Focal neuropathy-Focal neuropathy can be painful and unpredictable and occurs most often in older adults with diabetes mellitus. It involves a specific nerve or one area and often comes on suddenly. It usually does not cause long-term problems.  Radiculoplexus neuropathy- Sometimes called lumbosacral radiculoplexus neuropathy, radiculoplexus neuropathy affects the nerves of the thighs, hips, buttocks, or legs. It is more common in people with type 2 diabetes mellitus and in older men. It is characterized by debilitating pain, weakness, and atrophy, usually in the thigh muscles.  What are the causes? The cause of peripheral, autonomic, and focal neuropathies is diabetes mellitus that is uncontrolled and high glucose levels. The cause of radiculoplexus neuropathy is unknown. However, it is thought to be caused by inflammation related to uncontrolled glucose levels. What are the signs or symptoms? Peripheral Neuropathy Peripheral  neuropathy develops slowly over time. When the nerves of the feet and legs no longer work there may be:  Burning, stabbing, or aching pain in the legs or feet.  Inability to feel pressure or pain in your feet. This can lead to: ? Thick calluses over pressure areas. ? Pressure sores. ? Ulcers.  Foot deformities.  Reduced ability to feel temperature changes.  Muscle weakness.  Autonomic Neuropathy The symptoms of autonomic neuropathy vary depending on which nerves are affected. Symptoms may include:  Problems with digestion, such as: ? Feeling sick to your stomach (nausea). ? Vomiting. ? Bloating. ? Constipation. ? Diarrhea. ? Abdominal pain.  Difficulty with urination. This occurs if you lose your ability to sense when your bladder is full. Problems include: ? Urine leakage (incontinence). ? Inability to empty your bladder completely (retention).  Rapid or irregular heartbeat (palpitations).  Blood pressure drops when you stand up (orthostatic hypotension). When you stand up you may feel: ? Dizzy. ? Weak. ? Faint.  In men, inability to attain and maintain an erection.  In women, vaginal dryness and problems with decreased sexual desire and arousal.  Problems with body temperature regulation.  Increased or decreased sweating.  Focal Neuropathy  Abnormal eye movements or abnormal alignment of both eyes.  Weakness in the wrist.  Foot drop. This results in an inability to lift the foot properly and abnormal walking or foot movement.  Paralysis on one side of your face (Bell palsy).  Chest or abdominal pain. Radiculoplexus Neuropathy  Sudden, severe pain in your hip, thigh, or buttocks.  Weakness and wasting of thigh muscles.  Difficulty rising from a seated position.  Abdominal swelling.  Unexplained weight loss (usually more than 10 lb [4.5 kg]). How is   this diagnosed? Peripheral Neuropathy Your senses may be tested. Sensory function testing can be  done with:  A light touch using a monofilament.  A vibration with tuning fork.  A sharp sensation with a pin prick.  Other tests that can help diagnose neuropathy are:  Nerve conduction velocity. This test checks the transmission of an electrical current through a nerve.  Electromyography. This shows how muscles respond to electrical signals transmitted by nearby nerves.  Quantitative sensory testing. This is used to assess how your nerves respond to vibrations and changes in temperature.  Autonomic Neuropathy Diagnosis is often based on reported symptoms. Tell your health care provider if you experience:  Dizziness.  Constipation.  Diarrhea.  Inappropriate urination or inability to urinate.  Inability to get or maintain an erection.  Tests that may be done include:  Electrocardiography or Holter monitor. These are tests that can help show problems with the heart rate or heart rhythm.  An X-ray exam may be done.  Focal Neuropathy Diagnosis is made based on your symptoms and what your health care provider finds during your exam. Other tests may be done. They may include:  Nerve conduction velocities. This checks the transmission of electrical current through a nerve.  Electromyography. This shows how muscles respond to electrical signals transmitted by nearby nerves.  Quantitative sensory testing. This test is used to assess how your nerves respond to vibration and changes in temperature.  Radiculoplexus Neuropathy  Often the first thing is to eliminate any other issue or problems that might be the cause, as there is no standard test for diagnosis.  X-ray exam of your spine and lumbar region.  Spinal tap to rule out cancer.  MRI to rule out other lesions. How is this treated? Once nerve damage occurs, it cannot be reversed. The goal of treatment is to keep the disease or nerve damage from getting worse and affecting more nerve fibers. Controlling your blood  glucose level is the key. Most people with radiculoplexus neuropathy see at least a partial improvement over time. You will need to keep your blood glucose and HbA1c levels in the target range determined by your health care provider. Things that help control blood glucose levels include:  Blood glucose monitoring.  Meal planning.  Physical activity.  Diabetes medicine.  Over time, maintaining lower blood glucose levels helps lessen symptoms. Sometimes, prescription pain medicine is needed. Follow these instructions at home:  Do not smoke.  Keep your blood glucose level in the range that you and your health care provider have determined acceptable for you.  Keep your blood pressure level in the range that you and your health care provider have determined acceptable for you.  Eat a well-balanced diet.  Be physically active every day. Include strength training and balance exercises.  Protect your feet. ? Check your feet every day for sores, cuts, blisters, or signs of infection. ? Wear padded socks and supportive shoes. Use orthotic inserts, if necessary. ? Regularly check the insides of your shoes for worn spots. Make sure there are no rocks or other items inside your shoes before you put them on. Contact a health care provider if:  You have burning, stabbing, or aching pain in the legs or feet.  You are unable to feel pressure or pain in your feet.  You develop problems with digestion such as: ? Nausea. ? Vomiting. ? Bloating. ? Constipation. ? Diarrhea. ? Abdominal pain.  You have difficulty with urination, such as: ? Incontinence. ? Retention.    You have palpitations.  You develop orthostatic hypotension. When you stand up you may feel: ? Dizzy. ? Weak. ? Faint.  You cannot attain and maintain an erection (in men).  You have vaginal dryness and problems with decreased sexual desire and arousal (in women).  You have severe pain in your thighs, legs, or  buttocks.  You have unexplained weight loss. This information is not intended to replace advice given to you by your health care provider. Make sure you discuss any questions you have with your health care provider. Document Released: 02/07/2002 Document Revised: 05/06/2016 Document Reviewed: 05/10/2013 Elsevier Interactive Patient Education  2017 [employer]  

## 2016-12-01 NOTE — Progress Notes (Signed)
Pre visit review using our clinic review tool, if applicable. No additional management support is needed unless otherwise documented below in the visit note. 

## 2016-12-01 NOTE — Progress Notes (Signed)
Subjective:  Patient ID: Joseph Wood, male    DOB: [birth year]  Age: 45 y.o. MRN: [id number]  CC: Diabetes   HPI Jaken J Parveen presents for Complaints of a vague pain symmetrically from his knees down into his feet. He describes a diffuse aching sensation around his lower legs, his calves and his feet. He also complains of a pins and needles sensation around his ankles and feet. He says his symptoms increase at night and he does have some night cramps but he denies claudication during the day. He takes meloxicam and does get some symptom relief.  Outpatient Medications Prior to Visit  Medication Sig Dispense Refill  . aspirin EC 81 MG tablet Take 1 tablet (81 mg total) by mouth daily. 90 tablet 3  . atorvastatin (LIPITOR) 10 MG tablet Take 1 tablet (10 mg total) by mouth daily. 90 tablet 3  . Blood Glucose Monitoring Suppl (BAYER CONTOUR NEXT MONITOR) w/Device KIT Use to check blood sugar DX E11.8 1 kit 0  . glucose blood (BAYER CONTOUR NEXT TEST) test strip Use as instructed DX E11.8 100 each 4  . INVOKANA 300 MG TABS tablet TAKE ONE TABLET BY MOUTH ONCE DAILY BEFORE BREAKFAST 90 tablet 1  . Lancets (ONETOUCH ULTRASOFT) lancets Use to test blood sugar tid. DX E11.9 300 each 3  . meloxicam (MOBIC) 15 MG tablet Take 1 tablet (15 mg total) by mouth daily. 90 tablet 1  . metFORMIN (GLUCOPHAGE) 500 MG tablet TAKE ONE TABLET BY MOUTH TWICE DAILY WITH A MEAL 180 tablet 1  . methocarbamol (ROBAXIN) 750 MG tablet Take 1 tablet (750 mg total) by mouth every 6 (six) hours as needed for muscle spasms. 270 tablet 1  . omeprazole (PRILOSEC) 20 MG capsule TAKE ONE CAPSULE BY MOUTH ONCE DAILY 90 capsule 3  . ONETOUCH VERIO test strip TEST UP TO THREE TIMES DAILY 300 each 3  . valsartan (DIOVAN) 160 MG tablet TAKE ONE TABLET BY MOUTH ONCE DAILY 90 tablet 1  . fluticasone furoate-vilanterol (BREO ELLIPTA) 200-25 MCG/INH AEPB Inhale 1 puff into the lungs daily. (Patient not taking: Reported on  12/01/2016) 30 each 11   No facility-administered medications prior to visit.     ROS Review of Systems  Constitutional: Negative for appetite change, diaphoresis and fatigue.  HENT: Negative.   Eyes: Negative for visual disturbance.  Respiratory: Negative for cough, chest tightness, shortness of breath and stridor.   Cardiovascular: Negative for chest pain, palpitations and leg swelling.  Gastrointestinal: Negative for abdominal pain, constipation, diarrhea, nausea and vomiting.  Endocrine: Negative for cold intolerance, heat intolerance, polydipsia, polyphagia and polyuria.  Genitourinary: Negative.  Negative for difficulty urinating.  Musculoskeletal: Positive for arthralgias, back pain and myalgias.  Skin: Negative.   Allergic/Immunologic: Negative.   Neurological: Negative.  Negative for dizziness, weakness and numbness.  Hematological: Negative.  Negative for adenopathy. Does not bruise/bleed easily.  Psychiatric/Behavioral: Negative.     Objective:  BP 120/64 (BP Location: Left Arm, Patient Position: Sitting, Cuff Size: Large)   Pulse 98   Temp 98.3 F (36.8 C) (Oral)   Resp 16   Ht 5' 10"  (1.778 m)   Wt (!) 399 lb (181 kg)   SpO2 95%   BMI 57.25 kg/m   BP Readings from Last 3 Encounters:  12/01/16 120/64  10/20/16 122/68  09/28/16 116/74    Wt Readings from Last 3 Encounters:  12/01/16 (!) 399 lb (181 kg)  10/20/16 (!) 394 lb 7 oz (178.9 kg)  09/28/16 (!) 402 lb (182.3 kg)    Physical Exam  Constitutional: He is oriented to person, place, and time. No distress.  HENT:  Mouth/Throat: Oropharynx is clear and moist. No oropharyngeal exudate.  Eyes: Conjunctivae are normal. Right eye exhibits no discharge. Left eye exhibits no discharge. No scleral icterus.  Neck: Normal range of motion. Neck supple. No JVD present. No tracheal deviation present. No thyromegaly present.  Cardiovascular: Normal rate, regular rhythm, normal heart sounds and intact distal pulses.   Exam reveals no gallop and no friction rub.   No murmur heard. Pulses:      Popliteal pulses are 1+ on the right side, and 1+ on the left side.       Dorsalis pedis pulses are 1+ on the right side, and 1+ on the left side.       Posterior tibial pulses are 1+ on the right side, and 1+ on the left side.  Pulmonary/Chest: Effort normal and breath sounds normal. No stridor. No respiratory distress. He has no wheezes. He has no rales. He exhibits no tenderness.  Abdominal: Soft. Bowel sounds are normal. He exhibits no distension and no mass. There is no tenderness. There is no rebound and no guarding.  Musculoskeletal: Normal range of motion. He exhibits no edema, tenderness or deformity.       Right knee: Normal.       Left knee: Normal.       Right ankle: Normal.       Left ankle: Normal.       Right lower leg: Normal.       Left lower leg: Normal.  Lymphadenopathy:    He has no cervical adenopathy.  Neurological: He is oriented to person, place, and time. He has normal strength. He displays no atrophy, no tremor and normal reflexes. No cranial nerve deficit or sensory deficit. He exhibits normal muscle tone. He displays a negative Romberg sign. He displays no seizure activity. Coordination and gait normal.  Reflex Scores:      Tricep reflexes are 1+ on the right side and 1+ on the left side.      Bicep reflexes are 1+ on the right side and 1+ on the left side.      Brachioradialis reflexes are 1+ on the right side and 1+ on the left side.      Patellar reflexes are 1+ on the right side and 1+ on the left side.      Achilles reflexes are 0 on the right side and 0 on the left side. Skin: Skin is warm and dry. No rash noted. He is not diaphoretic. No erythema. No pallor.  Psychiatric: He has a normal mood and affect. His behavior is normal. Judgment and thought content normal.  Vitals reviewed.   Lab Results  Component Value Date   WBC 9.5 09/28/2016   HGB 15.7 09/28/2016   HCT 47.5  09/28/2016   PLT 257.0 09/28/2016   GLUCOSE 124 (H) 09/28/2016   CHOL 169 04/06/2016   TRIG 160.0 (H) 04/06/2016   HDL 48.00 04/06/2016   LDLCALC 89 04/06/2016   ALT 25 09/28/2016   AST 19 09/28/2016   NA 143 09/28/2016   K 3.7 09/28/2016   CL 107 09/28/2016   CREATININE 1.03 09/28/2016   BUN 16 09/28/2016   CO2 28 09/28/2016   TSH 1.58 04/06/2016   PSA 1.01 06/23/2015   HGBA1C 6.5 09/28/2016   MICROALBUR <0.7 04/06/2016    Mr Thoracic Spine Wo Contrast  Result Date: 10/10/2016 CLINICAL DATA:  Right lower back pain. Right-sided sciatica. Thoracic vertebral collapse, sequela EXAM: MRI THORACIC AND LUMBAR SPINE WITHOUT CONTRAST TECHNIQUE: Multiplanar and multiecho pulse sequences of the thoracic and lumbar spine were obtained without intravenous contrast. COMPARISON:  Lumbar radiographs 09/18/2016 FINDINGS: MRI THORACIC SPINE FINDINGS Alignment: Normal alignment. Thoracic kyphosis at T10-11 measuring approximately 35 degrees. Vertebrae: Schmorl's nodes are present from T6 through T12. Mild anterior wedging at T8, T9, T10, T11, and T12. Normal bone marrow. This appears chronic and may be related to Scheuermann's disease. No significant fracture identified. No worrisome bone marrow lesion identified. Cord:  Normal signal and morphology. Paraspinal and other soft tissues: Paraspinous soft tissues normal. No pleural effusion. Disc levels: Disc degeneration and Schmorl's nodes from T6 through T12. Small central disc protrusion at T5-6 and T6-7 on the right. No significant spinal stenosis. No cord compression. MRI LUMBAR SPINE FINDINGS Segmentation:  Normal Alignment:  Normal Vertebrae: Negative for fracture or mass. No worrisome bone marrow lesion identified. Conus medullaris: Extends to the L1-2 level and appears normal. Paraspinal and other soft tissues: Negative Disc levels: L1-2:  Small left-sided disc protrusion without stenosis L2-3: Mild disc degeneration and disc bulging with Schmorl's  nodes. No significant stenosis L3-4: Small central disc protrusion. Diffuse bulging of the disc. No significant canal stenosis. L4-5: Disc degeneration with diffuse bulging of the disc and mild endplate spurring. Small central disc protrusion. No significant canal stenosis. L5-S1: Mild disc degeneration. Bilateral facet degeneration causing mild foraminal narrowing bilaterally. IMPRESSION: MR THORACIC SPINE IMPRESSION Lower thoracic kyphosis. Multiple levels of Schmorl's nodes and vertebral body wedging T8 through T12 most consistent with Scheuermann's disease. No acute fracture or mass. No significant thoracic spinal stenosis MR LUMBAR SPINE IMPRESSION Mild degenerative changes in the lumbar spine. Mild foraminal narrowing bilaterally L5-S1 due to facet hypertrophy. Electronically Signed   By: Franchot Gallo M.D.   On: 10/10/2016 15:31   Mr Lumbar Spine Wo Contrast  Result Date: 10/10/2016 CLINICAL DATA:  Right lower back pain. Right-sided sciatica. Thoracic vertebral collapse, sequela EXAM: MRI THORACIC AND LUMBAR SPINE WITHOUT CONTRAST TECHNIQUE: Multiplanar and multiecho pulse sequences of the thoracic and lumbar spine were obtained without intravenous contrast. COMPARISON:  Lumbar radiographs 09/18/2016 FINDINGS: MRI THORACIC SPINE FINDINGS Alignment: Normal alignment. Thoracic kyphosis at T10-11 measuring approximately 35 degrees. Vertebrae: Schmorl's nodes are present from T6 through T12. Mild anterior wedging at T8, T9, T10, T11, and T12. Normal bone marrow. This appears chronic and may be related to Scheuermann's disease. No significant fracture identified. No worrisome bone marrow lesion identified. Cord:  Normal signal and morphology. Paraspinal and other soft tissues: Paraspinous soft tissues normal. No pleural effusion. Disc levels: Disc degeneration and Schmorl's nodes from T6 through T12. Small central disc protrusion at T5-6 and T6-7 on the right. No significant spinal stenosis. No cord  compression. MRI LUMBAR SPINE FINDINGS Segmentation:  Normal Alignment:  Normal Vertebrae: Negative for fracture or mass. No worrisome bone marrow lesion identified. Conus medullaris: Extends to the L1-2 level and appears normal. Paraspinal and other soft tissues: Negative Disc levels: L1-2:  Small left-sided disc protrusion without stenosis L2-3: Mild disc degeneration and disc bulging with Schmorl's nodes. No significant stenosis L3-4: Small central disc protrusion. Diffuse bulging of the disc. No significant canal stenosis. L4-5: Disc degeneration with diffuse bulging of the disc and mild endplate spurring. Small central disc protrusion. No significant canal stenosis. L5-S1: Mild disc degeneration. Bilateral facet degeneration causing mild foraminal narrowing bilaterally. IMPRESSION: MR THORACIC  SPINE IMPRESSION Lower thoracic kyphosis. Multiple levels of Schmorl's nodes and vertebral body wedging T8 through T12 most consistent with Scheuermann's disease. No acute fracture or mass. No significant thoracic spinal stenosis MR LUMBAR SPINE IMPRESSION Mild degenerative changes in the lumbar spine. Mild foraminal narrowing bilaterally L5-S1 due to facet hypertrophy. Electronically Signed   By: Franchot Gallo M.D.   On: 10/10/2016 15:31    Assessment & Plan:   Echo was seen today for diabetes.  Diagnoses and all orders for this visit:  Neuropathy involving both lower extremities- he has multiple causes for lower extremity pain and possible neuropathy. I will order a NCS/EMG to see if this is originating from his spinal stenosis or diabetes. We'll go ahead and start Lyrica for symptom relief. Will also consider if this is a progression of his osteoarthritis or plantar fasciitis. For now he will continue the current options for pain relief. -     Ambulatory referral to Neurology -     pregabalin (LYRICA) 50 MG capsule; Take 1 capsule (50 mg total) by mouth 3 (three) times daily.   I have discontinued Mr.  Katzenstein fluticasone furoate-vilanterol. I am also having him start on pregabalin. Additionally, I am having him maintain his aspirin EC, BAYER CONTOUR NEXT MONITOR, glucose blood, atorvastatin, metFORMIN, INVOKANA, methocarbamol, meloxicam, valsartan, omeprazole, ONETOUCH VERIO, and onetouch ultrasoft.  Meds ordered this encounter  Medications  . pregabalin (LYRICA) 50 MG capsule    Sig: Take 1 capsule (50 mg total) by mouth 3 (three) times daily.    Dispense:  90 capsule    Refill:  5     Follow-up: Return in about 3 months (around 03/01/2017).  Scarlette Calico, MD

## 2016-12-08 ENCOUNTER — Other Ambulatory Visit: Payer: Self-pay | Admitting: *Deleted

## 2016-12-08 DIAGNOSIS — G609 Hereditary and idiopathic neuropathy, unspecified: Secondary | ICD-10-CM

## 2016-12-14 ENCOUNTER — Ambulatory Visit (INDEPENDENT_AMBULATORY_CARE_PROVIDER_SITE_OTHER): Payer: 59 | Admitting: Neurology

## 2016-12-14 ENCOUNTER — Encounter: Payer: Self-pay | Admitting: Internal Medicine

## 2016-12-14 ENCOUNTER — Other Ambulatory Visit: Payer: Self-pay | Admitting: Internal Medicine

## 2016-12-14 DIAGNOSIS — G5793 Unspecified mononeuropathy of bilateral lower limbs: Secondary | ICD-10-CM

## 2016-12-14 DIAGNOSIS — E0842 Diabetes mellitus due to underlying condition with diabetic polyneuropathy: Secondary | ICD-10-CM

## 2016-12-14 DIAGNOSIS — G609 Hereditary and idiopathic neuropathy, unspecified: Secondary | ICD-10-CM

## 2016-12-14 NOTE — Procedures (Signed)
[facility]  [address]  [place], [place] [address] Tel: [phone] Fax:  [phone] Test Date:  12/14/2016  Patient: Joseph Wood DOB: 1971/09/01 Physician: Narda Amber, DO  Sex: Male Height: 5' 9"  Ref Phys: Scarlette Calico, M.D.  ID#: [id number] Temp: 33.3C Technician: Jerilynn Mages. Dean   Patient Complaints: This is a 46 year old gentleman referred for evaluation of bilateral leg paresthesias and pain.   NCV & EMG Findings: Extensive electrodiagnostic testing of the right lower extremity and additional studies of the left shows:  1. Bilateral superficial peroneal sensory responses are absent. Left sural sensory response shows reduced amplitude. Right sural sensory response is within normal limits. 2. Bilateral tibial motor responses show reduced amplitude (R4.0, L6.2 mV).  Bilateral peroneal motor responses are within normal limits. 3. Bilateral tibial H reflex studies are within normal limits. 4. Sparse chronic motor axon loss changes are seen affecting bilateral tibialis anterior and flexor digitorum longus muscles. There is no evidence of accompanied active denervation.  Impression: The electrophysiologic findings are most consistent with a chronic and symmetric sensorimotor polyneuropathy, axon loss in type, affecting the lower extremities. Overall, these findings are moderate in degree electrically.    ___________________________ Narda Amber, DO    Nerve Conduction Studies Anti Sensory Summary Table   Site NR Peak (ms) Norm Peak (ms) P-T Amp (V) Norm P-T Amp  Left Sup Peroneal Anti Sensory (Ant Lat Mall)  33.3C  12 cm NR  <4.5  >5  Right Sup Peroneal Anti Sensory (Ant Lat Mall)  33.3C  12 cm NR  <4.5  >5  Left Sural Anti Sensory (Lat Mall)  33.3C  Calf    2.6 <4.5 4.1 >5  Right Sural Anti Sensory (Lat Mall)  33.3C  Calf    4.3 <4.5 7.2 >5   Motor Summary Table   Site NR Onset (ms) Norm Onset (ms) O-P Amp (mV) Norm O-P Amp Site1 Site2  Delta-0 (ms) Dist (cm) Vel (m/s) Norm Vel (m/s)  Left Peroneal Motor (Ext Dig Brev)  33.3C  Ankle    4.8 <5.5 4.6 >3 B Fib Ankle 7.5 34.0 45 >40  B Fib    12.3  4.4  Poplt B Fib 2.0 10.0 50 >40  Poplt    14.3  4.4         Right Peroneal Motor (Ext Dig Brev)  33.3C  Ankle    4.3 <5.5 5.1 >3 B Fib Ankle 7.6 35.0 46 >40  B Fib    11.9  4.7  Poplt B Fib 2.0 10.0 50 >40  Poplt    13.9  4.7         Left Tibial Motor (Abd Hall Brev)  33.3C  Ankle    3.8 <6.0 6.2 >8 Knee Ankle 8.8 39.0 44 >40  Knee    12.6  6.2         Right Tibial Motor (Abd Hall Brev)  33.3C  Ankle    4.5 <6.0 4.0 >8 Knee Ankle 9.3 39.0 42 >40  Knee    13.8  2.4          H Reflex Studies   NR H-Lat (ms) Lat Norm (ms) L-R H-Lat (ms) M-Lat (ms) HLat-MLat (ms)  Left Tibial (Gastroc)  33.3C     31.56 <35 1.37 5.44 26.12  Right Tibial (Gastroc)  33.3C     32.93 <35 1.37 4.90 28.03   EMG   Side Muscle Ins Act Fibs Psw Fasc Number Recrt Dur Dur. Amp  Amp. Poly Poly. Comment  Right AntTibialis Nml Nml Nml Nml 1- Rapid Few 1+ Few 1+ Nml Nml N/A  Right Gastroc Nml Nml Nml Nml Nml Nml Nml Nml Nml Nml Nml Nml N/A  Right Flex Dig Long Nml Nml Nml Nml 1- Rapid Some 1+ Some 1+ Nml Nml N/A  Right RectFemoris Nml Nml Nml Nml Nml Nml Nml Nml Nml Nml Nml Nml N/A  Right GluteusMed Nml Nml Nml Nml Nml Nml Nml Nml Nml Nml Nml Nml N/A  Right BicepsFemS Nml Nml Nml Nml Nml Nml Nml Nml Nml Nml Nml Nml N/A  Left AntTibialis Nml Nml Nml Nml 1- Rapid Few 1+ Few 1+ Nml Nml N/A  Left Gastroc Nml Nml Nml Nml Nml Nml Nml Nml Nml Nml Nml Nml N/A  Left Flex Dig Long Nml Nml Nml Nml 2- Rapid Some 1+ Some 1+ Nml Nml N/A      Waveforms:

## 2017-01-05 ENCOUNTER — Encounter: Payer: Self-pay | Admitting: Neurology

## 2017-01-05 ENCOUNTER — Ambulatory Visit (INDEPENDENT_AMBULATORY_CARE_PROVIDER_SITE_OTHER): Payer: 59 | Admitting: Neurology

## 2017-01-05 ENCOUNTER — Telehealth: Payer: Self-pay | Admitting: Internal Medicine

## 2017-01-05 VITALS — BP 100/64 | HR 72 | Ht 70.0 in | Wt >= 6400 oz

## 2017-01-05 DIAGNOSIS — E1142 Type 2 diabetes mellitus with diabetic polyneuropathy: Secondary | ICD-10-CM

## 2017-01-05 DIAGNOSIS — M25579 Pain in unspecified ankle and joints of unspecified foot: Secondary | ICD-10-CM | POA: Diagnosis not present

## 2017-01-05 DIAGNOSIS — E0842 Diabetes mellitus due to underlying condition with diabetic polyneuropathy: Secondary | ICD-10-CM

## 2017-01-05 MED ORDER — PREGABALIN 100 MG PO CAPS: 100.0000 mg | ORAL_CAPSULE | Freq: Two times a day (BID) | ORAL | 5 refills | Status: DC

## 2017-01-05 NOTE — Progress Notes (Signed)
Joseph Wood   Date: 01/05/17  Joseph Wood MRN: [id number] DOB: 06/16/1971   Dear Dr. Ronnald Ramp:  Thank you for your kind referral of Joseph Wood for consultation of neuropathy. Although his history is well known to you, please allow [place] to reiterate it for the purpose of our medical record. The patient was accompanied to the clinic by Joseph Wood who also provides collateral information.     History of Present Illness: Joseph Wood is a 46 y.o. right-handed male with diabetes mellitus, hypertension, OSA, hyperlipidemia, GERD presenting for evaluation of bilateral feet paresthesias.    He was diagnosed with diabetes in 2015 and about 2 years later, he noticed intermittent sharp pain of the feet, especially over the medial feet.  These symptoms are sporadic and he has not noticed any triggers.  There is some numbness and tingling of the feet.  He had NCS/EMG of the legs performed by myself which showed moderate sensorimotor polyneuropathy and referred for further evaluation.  His diabetes has been well-controlled and last HbA1c was 6.5.  He takes Lyrica 8m TID and has not noticed any difference.    He also complains of chronic achy pain of the lower legs and especially ankle pain.  His ankle pain is dull, like a toothache, and worse after prolonged standing and walking.  In the morning, it is difficult for him to even put his socks on because the ankle joint hurts to move his foot. He has tried Aleve which helps some.  There was no benefit with ibuprofen.  He has pes planus and has previously seen podiatry who recommended using inserts soles but this caused a lot of discomfort and worsening pain.  He also is aware of his weight which is contributing to his joint pain and has been thinking of bariatric surgery.     Out-side paper records, electronic medical record, and images have been reviewed where available and summarized  as:  NCS/EMG of the legs 12/14/2016:  The electrophysiologic findings are most consistent with a chronic and symmetric sensorimotor polyneuropathy, axon loss in type, affecting the lower extremities. Overall, these findings are moderate in degree electrically.  Lab Results  Component Value Date   TSH 1.58 04/06/2016   Lab Results  Component Value Date   HGBA1C 6.5 09/28/2016    Past Medical History:  Diagnosis Date  . Diabetes mellitus without complication ([place]   . Hypertension   . Obesity   . Sleep apnea     Past Surgical History:  Procedure Laterality Date  . acl repair     Right  . COLONOSCOPY WITH PROPOFOL  11/06/2012   Procedure: COLONOSCOPY WITH PROPOFOL;  Surgeon: JJerene Bears MD;  Location: [facility];  Service: Gastroenterology;  Laterality: N/A;  Andrea/     Medications:  Outpatient Encounter Prescriptions as of 01/05/2017  Medication Sig  . aspirin EC 81 MG tablet Take 1 tablet (81 mg total) by mouth daily.  .Marland Kitchenatorvastatin (LIPITOR) 10 MG tablet Take 1 tablet (10 mg total) by mouth daily.  . Blood Glucose Monitoring Suppl (BAYER CONTOUR NEXT MONITOR) w/Device KIT Use to check blood sugar DX E11.8  . glucose blood (BAYER CONTOUR NEXT TEST) test strip Use as instructed DX E11.8  . INVOKANA 300 MG TABS tablet TAKE ONE TABLET BY MOUTH ONCE DAILY BEFORE BREAKFAST  . Lancets (ONETOUCH ULTRASOFT) lancets Use to test blood sugar tid. DX E11.9  . meloxicam (MOBIC) 15 MG tablet  Take 1 tablet (15 mg total) by mouth daily.  . metFORMIN (GLUCOPHAGE) 500 MG tablet TAKE ONE TABLET BY MOUTH TWICE DAILY WITH A MEAL  . methocarbamol (ROBAXIN) 750 MG tablet Take 1 tablet (750 mg total) by mouth every 6 (six) hours as needed for muscle spasms.  Marland Kitchen omeprazole (PRILOSEC) 20 MG capsule TAKE ONE CAPSULE BY MOUTH ONCE DAILY  . ONETOUCH VERIO test strip TEST UP TO THREE TIMES DAILY  . valsartan (DIOVAN) 160 MG tablet TAKE ONE TABLET BY MOUTH ONCE DAILY  . [DISCONTINUED] pregabalin  (LYRICA) 50 MG capsule Take 1 capsule (50 mg total) by mouth 3 (three) times daily.  . pregabalin (LYRICA) 100 MG capsule Take 1 capsule (100 mg total) by mouth 2 (two) times daily.   No facility-administered encounter medications on file as of 01/05/2017.      Allergies:  Allergies  Allergen Reactions  . Benazepril Hcl Cough  . Tramadol Other (See Comments)    dizziness    Family History: Family History  Problem Relation Age of Onset  . Arthritis Mother   . Hypertension Mother   . Breast cancer Mother   . Diabetes Mother   . Aneurysm Father     aortic  . Prostate cancer Father   . Hypertension Father   . Diabetes Father   . Hypertension Sister   . Rheum arthritis Sister   . Hypertension Brother     Social History: Social History  Substance Use Topics  . Smoking status: Never Smoker  . Smokeless tobacco: Never Used  . 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  He has one daughter. Lives in one story home.    Highest level of education: high school   Regular exercise-no    Review of Systems:  CONSTITUTIONAL: No fevers, chills, night sweats, or weight loss.   EYES: No visual changes or eye pain ENT: No hearing changes.  No history of nose bleeds.   RESPIRATORY: No cough, wheezing and shortness of breath.   CARDIOVASCULAR: Negative for chest pain, and palpitations.   GI: Negative for abdominal discomfort, blood in stools or black stools.  No recent change in bowel habits.   GU:  No history of incontinence.   MUSCLOSKELETAL: +history of joint pain or swelling.  No myalgias.   SKIN: Negative for lesions, rash, and itching.   HEMATOLOGY/ONCOLOGY: Negative for prolonged bleeding, bruising easily, and swollen nodes.  No history of cancer.   ENDOCRINE: Negative for cold or heat intolerance, polydipsia or goiter.   PSYCH:  No depression  or anxiety symptoms.   NEURO: As Above.   Vital Signs:  BP 100/64   Pulse 72   Ht 5' 10"  (1.778 m)   Wt (!) 402 lb 9 oz (182.6 kg)   SpO2 95%   BMI 57.76 kg/m    General Medical Exam:   General:  Morbidly obese, well appearing, comfortable.   Eyes/ENT: see cranial nerve examination.   Neck: No masses appreciated.  Full range of motion without tenderness.  No carotid bruits. Respiratory:  Clear to auscultation, good air entry bilaterally.   Cardiac:  Regular rate and rhythm, no murmur.   Extremities:  Pes planus bilaterally. No edema, or skin discoloration.  Skin:  No rashes or lesions.  Neurological Exam: MENTAL STATUS including orientation to time, place, person, recent and  remote memory, attention span and concentration, language, and fund of knowledge is normal.  Speech is not dysarthric.  CRANIAL NERVES: II:  No visual field defects.  Unremarkable fundi.   III-IV-VI: Pupils equal round and reactive to light.  Normal conjugate, extra-ocular eye movements in all directions of gaze.  No nystagmus.  No ptosis.   V:  Normal facial sensation.     VII:  Normal facial symmetry and movements.   VIII:  Normal hearing and vestibular function.   IX-X:  Normal palatal movement.   XI:  Normal shoulder shrug and head rotation.   XII:  Normal tongue strength and range of motion, no deviation or fasciculation.  MOTOR:  No atrophy, fasciculations or abnormal movements.  No pronator drift.  Tone is normal.    Right Upper Extremity:    Left Upper Extremity:    Deltoid  5/5   Deltoid  5/5   Biceps  5/5   Biceps  5/5   Triceps  5/5   Triceps  5/5   Wrist extensors  5/5   Wrist extensors  5/5   Wrist flexors  5/5   Wrist flexors  5/5   Finger extensors  5/5   Finger extensors  5/5   Finger flexors  5/5   Finger flexors  5/5   Dorsal interossei  5/5   Dorsal interossei  5/5   Abductor pollicis  5/5   Abductor pollicis  5/5   Tone (Ashworth scale)  0  Tone (Ashworth scale)  0   Right  Lower Extremity:    Left Lower Extremity:    Hip flexors  5/5   Hip flexors  5/5   Hip extensors  5/5   Hip extensors  5/5   Knee flexors  5/5   Knee flexors  5/5   Knee extensors  5/5   Knee extensors  5/5   Dorsiflexors  5/5   Dorsiflexors  5/5   Plantarflexors  5/5   Plantarflexors  5/5   Toe extensors  5/5   Toe extensors  5/5   Toe flexors  5/5   Toe flexors  5/5   Tone (Ashworth scale)  0  Tone (Ashworth scale)  0   MSRs:  Right                                                                 Left brachioradialis 2+  brachioradialis 2+  biceps 2+  biceps 2+  triceps 2+  triceps 2+  patellar 2+  patellar 2+  ankle jerk 2+  ankle jerk 2+  Hoffman no  Hoffman no  plantar response down  plantar response down   SENSORY:  Vibration is diminished by 30% at the great toe and pin prick is reduced over the dorsum of the feet bilaterally.  Sensation intact in the arms. Romberg's sign absent.   COORDINATION/GAIT: Normal finger-to- nose-finger.  Intact rapid alternating movements bilaterally. Gait is wide-based due to body habitus and stable. There is mild eversion when he walks due to loss of arch.  He is able to walk on heels without difficulty.  Toe walking is more challenging.  Tandem gait is intact.   IMPRESSION: Joseph Wood is a 46 year-old gentleman referred for bilateral feet pain.  His exam  and electrodiagnostic testing confirms the presence of neuropathy, which is mild-moderate.  For this, I will increase his Lyrica to 169m twice daily.  He is doing great with controlling his sugars and I praised him for this, last HbA1c was 6.5. I explained that even with well-controlled diabetes, neuropathy can still be a complication.    The second component to his feet pain seems to be stemming from orthopeadic etiology including pes planus causing foot deformity and ankle pain.  He seems to have some benefit with NSAIDs, but even with this, he has significant pain and discomfort with his  day-to-day activities due to ankle pain and stiffness. I recommend that he follow-up with podiatry to see if there are alternative options for management.  I encouraged him to consider weight loss strategies since the weight is contributing to excess stress on his knees and ankles.   Return to clinic as needed  The duration of this appointment Wood was 40 minutes of face-to-face time with the patient.  Greater than 50% of this time was spent in counseling, explanation of diagnosis, planning of further management, and coordination of care.   Thank you for allowing me to participate in patient's care.  If I can answer any additional questions, I would be pleased to do so.    Sincerely,    Jacky Hartung K. PPosey Pronto DO

## 2017-01-05 NOTE — Telephone Encounter (Signed)
Need PCP to sign letter and form. It should be ready tomorrow. Will you call pt?

## 2017-01-05 NOTE — Telephone Encounter (Signed)
Patient would like to know if forms have been completed for gastric bypass?

## 2017-01-05 NOTE — Telephone Encounter (Signed)
Notified patient.  He will be here tomorrow after 3pm.

## 2017-01-05 NOTE — Patient Instructions (Addendum)
Increase Lyrica to 141m twice daily to help with nerve pain See your podiatrist about your medial foot and ankle discomfort Return to clinic as needed

## 2017-01-12 ENCOUNTER — Telehealth: Payer: Self-pay | Admitting: Internal Medicine

## 2017-01-12 NOTE — Telephone Encounter (Signed)
Patient states that insurance has denied his gastric bypass surgery.  States he spoke with his insurance company and they have requesting Dr. Ronnald Ramp call to try to appeal this. Patient states insurance can be reached at [phone].

## 2017-01-13 NOTE — Telephone Encounter (Signed)
Pt informed that we will not be submitting an appeal.

## 2017-01-13 NOTE — Telephone Encounter (Signed)
No, I don't have anything else to add to this

## 2017-01-18 ENCOUNTER — Ambulatory Visit (INDEPENDENT_AMBULATORY_CARE_PROVIDER_SITE_OTHER): Payer: 59 | Admitting: Internal Medicine

## 2017-01-18 ENCOUNTER — Encounter: Payer: Self-pay | Admitting: Internal Medicine

## 2017-01-18 DIAGNOSIS — E118 Type 2 diabetes mellitus with unspecified complications: Secondary | ICD-10-CM

## 2017-01-18 DIAGNOSIS — Z794 Long term (current) use of insulin: Secondary | ICD-10-CM | POA: Diagnosis not present

## 2017-01-18 LAB — POCT GLYCOSYLATED HEMOGLOBIN (HGB A1C): HEMOGLOBIN A1C: 6.4

## 2017-01-18 NOTE — Progress Notes (Signed)
Pre visit review using our clinic review tool, if applicable. No additional management support is needed unless otherwise documented below in the visit note. 

## 2017-01-18 NOTE — Progress Notes (Signed)
Subjective:  Patient ID: Joseph Wood, male    DOB: [birth year]  Age: 46 y.o. MRN: [id number]  CC: Diabetes   HPI Etienne J Virts presents for follow-up on diabetes. He is also trying to undergo bariatric surgery. He was referred to the bariatric center but was not seen by a surgeon. He has been told that his insurance will not cover the surgery. He tells me his blood sugars have been well controlled. He denies polys.  Outpatient Medications Prior to Visit  Medication Sig Dispense Refill  . aspirin EC 81 MG tablet Take 1 tablet (81 mg total) by mouth daily. 90 tablet 3  . atorvastatin (LIPITOR) 10 MG tablet Take 1 tablet (10 mg total) by mouth daily. 90 tablet 3  . Blood Glucose Monitoring Suppl (BAYER CONTOUR NEXT MONITOR) w/Device KIT Use to check blood sugar DX E11.8 1 kit 0  . glucose blood (BAYER CONTOUR NEXT TEST) test strip Use as instructed DX E11.8 100 each 4  . INVOKANA 300 MG TABS tablet TAKE ONE TABLET BY MOUTH ONCE DAILY BEFORE BREAKFAST 90 tablet 1  . Lancets (ONETOUCH ULTRASOFT) lancets Use to test blood sugar tid. DX E11.9 300 each 3  . meloxicam (MOBIC) 15 MG tablet Take 1 tablet (15 mg total) by mouth daily. 90 tablet 1  . metFORMIN (GLUCOPHAGE) 500 MG tablet TAKE ONE TABLET BY MOUTH TWICE DAILY WITH A MEAL 180 tablet 1  . methocarbamol (ROBAXIN) 750 MG tablet Take 1 tablet (750 mg total) by mouth every 6 (six) hours as needed for muscle spasms. 270 tablet 1  . omeprazole (PRILOSEC) 20 MG capsule TAKE ONE CAPSULE BY MOUTH ONCE DAILY 90 capsule 3  . ONETOUCH VERIO test strip TEST UP TO THREE TIMES DAILY 300 each 3  . pregabalin (LYRICA) 100 MG capsule Take 1 capsule (100 mg total) by mouth 2 (two) times daily. 60 capsule 5  . valsartan (DIOVAN) 160 MG tablet TAKE ONE TABLET BY MOUTH ONCE DAILY 90 tablet 1   No facility-administered medications prior to visit.     ROS Review of Systems  Constitutional: Negative.  Negative for diaphoresis, fatigue and unexpected  weight change.  HENT: Negative.   Eyes: Negative for visual disturbance.  Respiratory: Negative for cough, chest tightness, shortness of breath and wheezing.   Cardiovascular: Negative for chest pain, palpitations and leg swelling.  Gastrointestinal: Negative for abdominal pain, blood in stool, constipation, diarrhea, nausea and vomiting.  Endocrine: Negative for cold intolerance, heat intolerance, polydipsia, polyphagia and polyuria.  Genitourinary: Negative.  Negative for difficulty urinating, dysuria, flank pain, frequency, hematuria and urgency.  Musculoskeletal: Negative.  Negative for arthralgias, back pain, myalgias and neck pain.  Skin: Negative.   Allergic/Immunologic: Negative.   Neurological: Negative.  Negative for dizziness, weakness and light-headedness.  Hematological: Negative for adenopathy. Does not bruise/bleed easily.  Psychiatric/Behavioral: Negative.     Objective:  BP 110/68 (BP Location: Left Arm, Patient Position: Sitting, Cuff Size: Large)   Pulse 86   Temp 98 F (36.7 C) (Oral)   Resp 16   Ht 5' 10"  (1.778 m)   Wt (!) 399 lb 4 oz (181.1 kg)   SpO2 97%   BMI 57.29 kg/m   BP Readings from Last 3 Encounters:  01/18/17 110/68  01/05/17 100/64  12/01/16 120/64    Wt Readings from Last 3 Encounters:  01/18/17 (!) 399 lb 4 oz (181.1 kg)  01/05/17 (!) 402 lb 9 oz (182.6 kg)  12/01/16 (!) 399 lb (181 kg)  Physical Exam  Constitutional: He is oriented to person, place, and time. No distress.  HENT:  Mouth/Throat: Oropharynx is clear and moist. No oropharyngeal exudate.  Eyes: Conjunctivae are normal. Right eye exhibits no discharge. Left eye exhibits no discharge. No scleral icterus.  Neck: Normal range of motion. Neck supple. No JVD present. No tracheal deviation present. No thyromegaly present.  Cardiovascular: Normal rate, regular rhythm, normal heart sounds and intact distal pulses.  Exam reveals no gallop and no friction rub.   No murmur  heard. Pulmonary/Chest: Effort normal and breath sounds normal. No stridor. No respiratory distress. He has no wheezes. He has no rales. He exhibits no tenderness.  Abdominal: Soft. Bowel sounds are normal. He exhibits no distension and no mass. There is no tenderness. There is no rebound and no guarding.  Musculoskeletal: Normal range of motion. He exhibits no edema, tenderness or deformity.  Lymphadenopathy:    He has no cervical adenopathy.  Neurological: He is oriented to person, place, and time.  Skin: Skin is warm and dry. No rash noted. He is not diaphoretic. No erythema. No pallor.  Vitals reviewed.   Lab Results  Component Value Date   WBC 9.5 09/28/2016   HGB 15.7 09/28/2016   HCT 47.5 09/28/2016   PLT 257.0 09/28/2016   GLUCOSE 124 (H) 09/28/2016   CHOL 169 04/06/2016   TRIG 160.0 (H) 04/06/2016   HDL 48.00 04/06/2016   LDLCALC 89 04/06/2016   ALT 25 09/28/2016   AST 19 09/28/2016   NA 143 09/28/2016   K 3.7 09/28/2016   CL 107 09/28/2016   CREATININE 1.03 09/28/2016   BUN 16 09/28/2016   CO2 28 09/28/2016   TSH 1.58 04/06/2016   PSA 1.01 06/23/2015   HGBA1C 6.4 01/18/2017   MICROALBUR <0.7 04/06/2016    Mr Thoracic Spine Wo Contrast  Result Date: 10/10/2016 CLINICAL DATA:  Right lower back pain. Right-sided sciatica. Thoracic vertebral collapse, sequela EXAM: MRI THORACIC AND LUMBAR SPINE WITHOUT CONTRAST TECHNIQUE: Multiplanar and multiecho pulse sequences of the thoracic and lumbar spine were obtained without intravenous contrast. COMPARISON:  Lumbar radiographs 09/18/2016 FINDINGS: MRI THORACIC SPINE FINDINGS Alignment: Normal alignment. Thoracic kyphosis at T10-11 measuring approximately 35 degrees. Vertebrae: Schmorl's nodes are present from T6 through T12. Mild anterior wedging at T8, T9, T10, T11, and T12. Normal bone marrow. This appears chronic and may be related to Scheuermann's disease. No significant fracture identified. No worrisome bone marrow lesion  identified. Cord:  Normal signal and morphology. Paraspinal and other soft tissues: Paraspinous soft tissues normal. No pleural effusion. Disc levels: Disc degeneration and Schmorl's nodes from T6 through T12. Small central disc protrusion at T5-6 and T6-7 on the right. No significant spinal stenosis. No cord compression. MRI LUMBAR SPINE FINDINGS Segmentation:  Normal Alignment:  Normal Vertebrae: Negative for fracture or mass. No worrisome bone marrow lesion identified. Conus medullaris: Extends to the L1-2 level and appears normal. Paraspinal and other soft tissues: Negative Disc levels: L1-2:  Small left-sided disc protrusion without stenosis L2-3: Mild disc degeneration and disc bulging with Schmorl's nodes. No significant stenosis L3-4: Small central disc protrusion. Diffuse bulging of the disc. No significant canal stenosis. L4-5: Disc degeneration with diffuse bulging of the disc and mild endplate spurring. Small central disc protrusion. No significant canal stenosis. L5-S1: Mild disc degeneration. Bilateral facet degeneration causing mild foraminal narrowing bilaterally. IMPRESSION: MR THORACIC SPINE IMPRESSION Lower thoracic kyphosis. Multiple levels of Schmorl's nodes and vertebral body wedging T8 through T12 most consistent with  Scheuermann's disease. No acute fracture or mass. No significant thoracic spinal stenosis MR LUMBAR SPINE IMPRESSION Mild degenerative changes in the lumbar spine. Mild foraminal narrowing bilaterally L5-S1 due to facet hypertrophy. Electronically Signed   By: Franchot Gallo M.D.   On: 10/10/2016 15:31   Mr Lumbar Spine Wo Contrast  Result Date: 10/10/2016 CLINICAL DATA:  Right lower back pain. Right-sided sciatica. Thoracic vertebral collapse, sequela EXAM: MRI THORACIC AND LUMBAR SPINE WITHOUT CONTRAST TECHNIQUE: Multiplanar and multiecho pulse sequences of the thoracic and lumbar spine were obtained without intravenous contrast. COMPARISON:  Lumbar radiographs 09/18/2016  FINDINGS: MRI THORACIC SPINE FINDINGS Alignment: Normal alignment. Thoracic kyphosis at T10-11 measuring approximately 35 degrees. Vertebrae: Schmorl's nodes are present from T6 through T12. Mild anterior wedging at T8, T9, T10, T11, and T12. Normal bone marrow. This appears chronic and may be related to Scheuermann's disease. No significant fracture identified. No worrisome bone marrow lesion identified. Cord:  Normal signal and morphology. Paraspinal and other soft tissues: Paraspinous soft tissues normal. No pleural effusion. Disc levels: Disc degeneration and Schmorl's nodes from T6 through T12. Small central disc protrusion at T5-6 and T6-7 on the right. No significant spinal stenosis. No cord compression. MRI LUMBAR SPINE FINDINGS Segmentation:  Normal Alignment:  Normal Vertebrae: Negative for fracture or mass. No worrisome bone marrow lesion identified. Conus medullaris: Extends to the L1-2 level and appears normal. Paraspinal and other soft tissues: Negative Disc levels: L1-2:  Small left-sided disc protrusion without stenosis L2-3: Mild disc degeneration and disc bulging with Schmorl's nodes. No significant stenosis L3-4: Small central disc protrusion. Diffuse bulging of the disc. No significant canal stenosis. L4-5: Disc degeneration with diffuse bulging of the disc and mild endplate spurring. Small central disc protrusion. No significant canal stenosis. L5-S1: Mild disc degeneration. Bilateral facet degeneration causing mild foraminal narrowing bilaterally. IMPRESSION: MR THORACIC SPINE IMPRESSION Lower thoracic kyphosis. Multiple levels of Schmorl's nodes and vertebral body wedging T8 through T12 most consistent with Scheuermann's disease. No acute fracture or mass. No significant thoracic spinal stenosis MR LUMBAR SPINE IMPRESSION Mild degenerative changes in the lumbar spine. Mild foraminal narrowing bilaterally L5-S1 due to facet hypertrophy. Electronically Signed   By: Franchot Gallo M.D.   On:  10/10/2016 15:31    Assessment & Plan:   Om was seen today for diabetes.  Diagnoses and all orders for this visit:  Obesity, morbid, BMI 50 or higher ([place])- I have asked him to see the bariatric surgeon to inquire about getting the surgery covered by in his insurance -     Ambulatory referral to General Surgery  Type 2 diabetes mellitus with complication, with long-term current use of insulin ([place])- his A1C is down to 6.4%, blood sugars are well controlled -     POCT glycosylated hemoglobin (Hb A1C)   I am having Mr. Seder maintain his aspirin EC, BAYER CONTOUR NEXT MONITOR, glucose blood, atorvastatin, metFORMIN, INVOKANA, methocarbamol, meloxicam, valsartan, omeprazole, ONETOUCH VERIO, onetouch ultrasoft, and pregabalin.  No orders of the defined types were placed in this encounter.    Follow-up: Return in about 6 months (around 07/18/2017).  Scarlette Calico, MD

## 2017-01-18 NOTE — Patient Instructions (Signed)

## 2017-01-21 ENCOUNTER — Telehealth: Payer: Self-pay | Admitting: Internal Medicine

## 2017-01-21 NOTE — Telephone Encounter (Signed)
pregabalin (LYRICA) 100 MG capsule  Patient called in and said that his insurance has changed. This medication use to cost nothing and now it will cost him over $400. He wanted to know if there was something else he could take. I informed him he should call his pharmacy and see what other choices he has and give [place] a call back to let us know so Dr. Ronnald Ramp could tell him out of that what things will cost.

## 2017-01-24 ENCOUNTER — Telehealth: Payer: Self-pay | Admitting: Internal Medicine

## 2017-01-24 NOTE — Telephone Encounter (Signed)
I Have already submitted the info I have I don't have anything else to add They know he has diabetes

## 2017-01-24 NOTE — Telephone Encounter (Signed)
I called [facility] regarding the bariatric surgery and they said his insurance will not cover it because it is not part of his plan.  She said the only way they MAY cover it is if you his PCP would do a peer to peer with one of the dr's at the insurance company and if he had health issues that would require this. Is this something you would want to do? Please advise

## 2017-02-07 ENCOUNTER — Other Ambulatory Visit: Payer: Self-pay | Admitting: Internal Medicine

## 2017-02-17 ENCOUNTER — Telehealth: Payer: Self-pay | Admitting: *Deleted

## 2017-02-17 NOTE — Telephone Encounter (Signed)
Rec'd call pt states he is needing a cheaper alternative for his invokana & lyrica medication. Both meds is running over $400 and he can't afford. Inform pt that he would need to contact his insurance plan to have them to give him a cheaper compatible med for the two, and let Dr. Ronnald Ramp know and then he can send rx....Joseph Wood

## 2017-02-22 ENCOUNTER — Telehealth: Payer: Self-pay | Admitting: Internal Medicine

## 2017-02-22 ENCOUNTER — Other Ambulatory Visit: Payer: Self-pay | Admitting: Internal Medicine

## 2017-02-22 DIAGNOSIS — G5793 Unspecified mononeuropathy of bilateral lower limbs: Secondary | ICD-10-CM

## 2017-02-22 MED ORDER — PREGABALIN 75 MG PO CAPS: 75.0000 mg | ORAL_CAPSULE | Freq: Three times a day (TID) | ORAL | 5 refills | Status: DC

## 2017-02-22 NOTE — Telephone Encounter (Signed)
Pt called state he can not afford to pay for invokana and lyrica. He is hopping that we have some sample. Please call him back

## 2017-02-22 NOTE — Telephone Encounter (Signed)
Pt informed that rx samples are ready for pick up.

## 2017-02-22 NOTE — Telephone Encounter (Signed)
Yes I have samples  I have a lot of lyrica at the 75 mg dose - will give to him and change the dose to TID instead of his current dose of 100 mg BID, this should be pretty equivalent wrt side effects and symptom relief  Will leave in a bag in my office

## 2017-02-28 ENCOUNTER — Ambulatory Visit: Payer: BLUE CROSS/BLUE SHIELD | Admitting: Internal Medicine

## 2017-04-18 ENCOUNTER — Other Ambulatory Visit: Payer: Self-pay | Admitting: Internal Medicine

## 2017-04-18 DIAGNOSIS — E785 Hyperlipidemia, unspecified: Secondary | ICD-10-CM

## 2017-04-18 DIAGNOSIS — E118 Type 2 diabetes mellitus with unspecified complications: Secondary | ICD-10-CM

## 2017-05-17 ENCOUNTER — Encounter: Payer: Self-pay | Admitting: Internal Medicine

## 2017-05-17 ENCOUNTER — Ambulatory Visit (HOSPITAL_COMMUNITY)
Admission: RE | Admit: 2017-05-17 | Discharge: 2017-05-17 | Disposition: A | Discharge status: Home / Self Care | Payer: 59 | Source: Ambulatory Visit | Attending: Internal Medicine | Admitting: Internal Medicine

## 2017-05-17 ENCOUNTER — Ambulatory Visit (INDEPENDENT_AMBULATORY_CARE_PROVIDER_SITE_OTHER): Payer: 59 | Admitting: Internal Medicine

## 2017-05-17 VITALS — BP 140/80 | HR 79 | Temp 98.5°F | Resp 16 | Ht 70.0 in | Wt >= 6400 oz

## 2017-05-17 DIAGNOSIS — H538 Other visual disturbances: Secondary | ICD-10-CM | POA: Insufficient documentation

## 2017-05-17 DIAGNOSIS — H532 Diplopia: Secondary | ICD-10-CM | POA: Diagnosis not present

## 2017-05-17 NOTE — Progress Notes (Signed)
Subjective:  Patient ID: Joseph Wood, male    DOB: [birth year]  Age: 46 y.o. MRN: [id number]  CC: Headache   HPI Joseph Wood presents for the acute onset mild diffuse headache earlier today with blurred vision and double vision. The episode lasted several minutes and then spontaneously resolved. He also felt weak all over, dizzy, mildly ataxic, and his hands felt clumsy. He had no focal areas of numbness, weakness, or tingling.  Outpatient Medications Prior to Visit  Medication Sig Dispense Refill  . aspirin EC 81 MG tablet Take 1 tablet (81 mg total) by mouth daily. 90 tablet 3  . atorvastatin (LIPITOR) 10 MG tablet TAKE ONE TABLET BY MOUTH ONCE DAILY 30 tablet 11  . Blood Glucose Monitoring Suppl (BAYER CONTOUR NEXT MONITOR) w/Device KIT Use to check blood sugar DX E11.8 1 kit 0  . glucose blood (BAYER CONTOUR NEXT TEST) test strip Use as instructed DX E11.8 100 each 4  . INVOKANA 300 MG TABS tablet TAKE ONE TABLET BY MOUTH ONCE DAILY BEFORE BREAKFAST 90 tablet 1  . Lancets (ONETOUCH ULTRASOFT) lancets Use to test blood sugar tid. DX E11.9 300 each 3  . meloxicam (MOBIC) 15 MG tablet Take 1 tablet (15 mg total) by mouth daily. 90 tablet 1  . metFORMIN (GLUCOPHAGE) 500 MG tablet TAKE ONE TABLET BY MOUTH TWICE DAILY WITH A MEAL 180 tablet 1  . methocarbamol (ROBAXIN) 750 MG tablet Take 1 tablet (750 mg total) by mouth every 6 (six) hours as needed for muscle spasms. 270 tablet 1  . omeprazole (PRILOSEC) 20 MG capsule TAKE ONE CAPSULE BY MOUTH ONCE DAILY 90 capsule 3  . ONETOUCH VERIO test strip TEST UP TO THREE TIMES DAILY 300 each 3  . pregabalin (LYRICA) 75 MG capsule Take 1 capsule (75 mg total) by mouth 3 (three) times daily. 90 capsule 5  . valsartan (DIOVAN) 160 MG tablet TAKE ONE TABLET BY MOUTH ONCE DAILY 90 tablet 1   No facility-administered medications prior to visit.     ROS Review of Systems  Constitutional: Negative for activity change, appetite change,  diaphoresis, fatigue and unexpected weight change.  HENT: Negative.  Negative for trouble swallowing.   Eyes: Positive for visual disturbance. Negative for photophobia, pain and redness.  Respiratory: Negative.  Negative for cough, chest tightness, shortness of breath and wheezing.   Cardiovascular: Negative for chest pain, palpitations and leg swelling.  Gastrointestinal: Negative for abdominal pain, constipation, diarrhea, nausea and vomiting.  Musculoskeletal: Positive for joint swelling. Negative for back pain, myalgias, neck pain and neck stiffness.  Skin: Negative.   Allergic/Immunologic: Negative.   Neurological: Positive for dizziness, weakness and headaches. Negative for seizures, syncope, facial asymmetry, speech difficulty, light-headedness and numbness.  Hematological: Negative.  Negative for adenopathy. Does not bruise/bleed easily.  Psychiatric/Behavioral: Negative.     Objective:  BP 140/80 (BP Location: Left Arm, Patient Position: Sitting, Cuff Size: Large)   Pulse 79   Temp 98.5 F (36.9 C) (Oral)   Ht 5' 10"  (1.778 m)   Wt (!) 403 lb (182.8 kg)   SpO2 97%   BMI 57.82 kg/m   BP Readings from Last 3 Encounters:  05/17/17 140/80  01/18/17 110/68  01/05/17 100/64    Wt Readings from Last 3 Encounters:  05/17/17 (!) 403 lb (182.8 kg)  01/18/17 (!) 399 lb 4 oz (181.1 kg)  01/05/17 (!) 402 lb 9 oz (182.6 kg)    Physical Exam  Constitutional: He is oriented to person,  place, and time. No distress.  HENT:  Mouth/Throat: Oropharynx is clear and moist. No oropharyngeal exudate.  Eyes: Conjunctivae and EOM are normal. Pupils are equal, round, and reactive to light. Right eye exhibits no discharge. Left eye exhibits no discharge. No scleral icterus.  Neck: Normal range of motion. Neck supple. No JVD present. No thyromegaly present.  Cardiovascular: Normal rate, regular rhythm and intact distal pulses.   No murmur heard. Pulmonary/Chest: Effort normal and breath  sounds normal. No respiratory distress. He has no wheezes. He has no rales. He exhibits no tenderness.  Abdominal: Soft. Bowel sounds are normal. He exhibits no distension and no mass. There is no tenderness. There is no rebound and no guarding.  Musculoskeletal: Normal range of motion. He exhibits no edema, tenderness or deformity.  Lymphadenopathy:    He has no cervical adenopathy.  Neurological: He is alert and oriented to person, place, and time. He displays no atrophy, no tremor and normal reflexes. No cranial nerve deficit or sensory deficit. He exhibits normal muscle tone. He displays a negative Romberg sign. He displays no seizure activity. Coordination and gait normal. He displays no Babinski's sign on the right side. He displays no Babinski's sign on the left side.  Reflex Scores:      Tricep reflexes are 1+ on the right side and 1+ on the left side.      Bicep reflexes are 1+ on the right side and 1+ on the left side.      Brachioradialis reflexes are 1+ on the right side and 1+ on the left side.      Patellar reflexes are 1+ on the right side and 1+ on the left side.      Achilles reflexes are 1+ on the right side and 1+ on the left side. Skin: Skin is warm and dry. No rash noted. He is not diaphoretic. No erythema. No pallor.  Vitals reviewed.   Lab Results  Component Value Date   WBC 9.5 09/28/2016   HGB 15.7 09/28/2016   HCT 47.5 09/28/2016   PLT 257.0 09/28/2016   GLUCOSE 124 (H) 09/28/2016   CHOL 169 04/06/2016   TRIG 160.0 (H) 04/06/2016   HDL 48.00 04/06/2016   LDLCALC 89 04/06/2016   ALT 25 09/28/2016   AST 19 09/28/2016   NA 143 09/28/2016   K 3.7 09/28/2016   CL 107 09/28/2016   CREATININE 1.03 09/28/2016   BUN 16 09/28/2016   CO2 28 09/28/2016   TSH 1.58 04/06/2016   PSA 1.01 06/23/2015   HGBA1C 6.4 01/18/2017   MICROALBUR <0.7 04/06/2016    Mr Thoracic Spine Wo Contrast  Result Date: 10/10/2016 CLINICAL DATA:  Right lower back pain. Right-sided  sciatica. Thoracic vertebral collapse, sequela EXAM: MRI THORACIC AND LUMBAR SPINE WITHOUT CONTRAST TECHNIQUE: Multiplanar and multiecho pulse sequences of the thoracic and lumbar spine were obtained without intravenous contrast. COMPARISON:  Lumbar radiographs 09/18/2016 FINDINGS: MRI THORACIC SPINE FINDINGS Alignment: Normal alignment. Thoracic kyphosis at T10-11 measuring approximately 35 degrees. Vertebrae: Schmorl's nodes are present from T6 through T12. Mild anterior wedging at T8, T9, T10, T11, and T12. Normal bone marrow. This appears chronic and may be related to Scheuermann's disease. No significant fracture identified. No worrisome bone marrow lesion identified. Cord:  Normal signal and morphology. Paraspinal and other soft tissues: Paraspinous soft tissues normal. No pleural effusion. Disc levels: Disc degeneration and Schmorl's nodes from T6 through T12. Small central disc protrusion at T5-6 and T6-7 on the right. No significant  spinal stenosis. No cord compression. MRI LUMBAR SPINE FINDINGS Segmentation:  Normal Alignment:  Normal Vertebrae: Negative for fracture or mass. No worrisome bone marrow lesion identified. Conus medullaris: Extends to the L1-2 level and appears normal. Paraspinal and other soft tissues: Negative Disc levels: L1-2:  Small left-sided disc protrusion without stenosis L2-3: Mild disc degeneration and disc bulging with Schmorl's nodes. No significant stenosis L3-4: Small central disc protrusion. Diffuse bulging of the disc. No significant canal stenosis. L4-5: Disc degeneration with diffuse bulging of the disc and mild endplate spurring. Small central disc protrusion. No significant canal stenosis. L5-S1: Mild disc degeneration. Bilateral facet degeneration causing mild foraminal narrowing bilaterally. IMPRESSION: MR THORACIC SPINE IMPRESSION Lower thoracic kyphosis. Multiple levels of Schmorl's nodes and vertebral body wedging T8 through T12 most consistent with Scheuermann's  disease. No acute fracture or mass. No significant thoracic spinal stenosis MR LUMBAR SPINE IMPRESSION Mild degenerative changes in the lumbar spine. Mild foraminal narrowing bilaterally L5-S1 due to facet hypertrophy. Electronically Signed   By: Franchot Gallo M.D.   On: 10/10/2016 15:31   Mr Lumbar Spine Wo Contrast  Result Date: 10/10/2016 CLINICAL DATA:  Right lower back pain. Right-sided sciatica. Thoracic vertebral collapse, sequela EXAM: MRI THORACIC AND LUMBAR SPINE WITHOUT CONTRAST TECHNIQUE: Multiplanar and multiecho pulse sequences of the thoracic and lumbar spine were obtained without intravenous contrast. COMPARISON:  Lumbar radiographs 09/18/2016 FINDINGS: MRI THORACIC SPINE FINDINGS Alignment: Normal alignment. Thoracic kyphosis at T10-11 measuring approximately 35 degrees. Vertebrae: Schmorl's nodes are present from T6 through T12. Mild anterior wedging at T8, T9, T10, T11, and T12. Normal bone marrow. This appears chronic and may be related to Scheuermann's disease. No significant fracture identified. No worrisome bone marrow lesion identified. Cord:  Normal signal and morphology. Paraspinal and other soft tissues: Paraspinous soft tissues normal. No pleural effusion. Disc levels: Disc degeneration and Schmorl's nodes from T6 through T12. Small central disc protrusion at T5-6 and T6-7 on the right. No significant spinal stenosis. No cord compression. MRI LUMBAR SPINE FINDINGS Segmentation:  Normal Alignment:  Normal Vertebrae: Negative for fracture or mass. No worrisome bone marrow lesion identified. Conus medullaris: Extends to the L1-2 level and appears normal. Paraspinal and other soft tissues: Negative Disc levels: L1-2:  Small left-sided disc protrusion without stenosis L2-3: Mild disc degeneration and disc bulging with Schmorl's nodes. No significant stenosis L3-4: Small central disc protrusion. Diffuse bulging of the disc. No significant canal stenosis. L4-5: Disc degeneration with  diffuse bulging of the disc and mild endplate spurring. Small central disc protrusion. No significant canal stenosis. L5-S1: Mild disc degeneration. Bilateral facet degeneration causing mild foraminal narrowing bilaterally. IMPRESSION: MR THORACIC SPINE IMPRESSION Lower thoracic kyphosis. Multiple levels of Schmorl's nodes and vertebral body wedging T8 through T12 most consistent with Scheuermann's disease. No acute fracture or mass. No significant thoracic spinal stenosis MR LUMBAR SPINE IMPRESSION Mild degenerative changes in the lumbar spine. Mild foraminal narrowing bilaterally L5-S1 due to facet hypertrophy. Electronically Signed   By: Franchot Gallo M.D.   On: 10/10/2016 15:31    Assessment & Plan:   Lestat was seen today for headache.  Diagnoses and all orders for this visit:  Blurred vision, bilateral- he has a myriad of alarming symptoms but a nonfocal neurologic exam. I have ordered a stat MRI of the brain to screen for CVA, mass, ICH, hydrocephalus, demyelinating disease, lesion of the optic nerve. -     MR BRAIN WO CONTRAST; Future  Double vision with both eyes open- as above -  MR BRAIN WO CONTRAST; Future   I am having Mr. Purves maintain his aspirin EC, BAYER CONTOUR NEXT MONITOR, glucose blood, INVOKANA, methocarbamol, meloxicam, valsartan, omeprazole, ONETOUCH VERIO, onetouch ultrasoft, metFORMIN, pregabalin, and atorvastatin.  No orders of the defined types were placed in this encounter.    Follow-up: Return in about 1 day (around 05/18/2017).  Scarlette Calico, MD

## 2017-05-17 NOTE — Patient Instructions (Signed)
Blurred Vision Having blurred vision means that you cannot see things clearly. Your vision may seem fuzzy or out of focus. Blurred vision is a very common symptom of an eye or vision problem. Blurred vision is often a gradual blur that occurs in one eye or both eyes. There are many causes of blurred vision, including cataracts, macular degeneration, and diabetic retinopathy. Blurred vision can be diagnosed based on your symptoms and a physical exam. Tell your health care provider about any other health problems you have, any recent eye injury, and any prior surgeries. You may need to see a health care provider who specializes in eye problems (ophthalmologist). Your treatment depends on what is causing your blurred vision.  HOME CARE INSTRUCTIONS  Tell your health care provider about any changes in your blurred vision.  Do not drive or operate heavy machinery if your vision is blurry.  Keep all follow-up visits as directed by your health care provider. This is important. SEEK MEDICAL CARE IF:  Your symptoms get worse.  You have new symptoms.  You have trouble seeing at night.  You have trouble seeing up close or far away.  You have trouble noticing the difference between colors. SEEK IMMEDIATE MEDICAL CARE IF:  You have severe eye pain.  You have a severe headache.  You have flashing lights in your field of vision.  You have a sudden change in vision.  You have a sudden loss of vision.  You have vision change after an injury.  You notice drainage coming from your eyes.  You notice a rash around your eyes. This information is not intended to replace advice given to you by your health care provider. Make sure you discuss any questions you have with your health care provider. Document Released: 12/02/2003 Document Revised: 04/15/2015 Document Reviewed: 10/23/2014 Elsevier Interactive Patient Education  2017 [employer].

## 2017-05-18 ENCOUNTER — Ambulatory Visit (INDEPENDENT_AMBULATORY_CARE_PROVIDER_SITE_OTHER): Payer: 59 | Admitting: Internal Medicine

## 2017-05-18 ENCOUNTER — Encounter: Payer: Self-pay | Admitting: Internal Medicine

## 2017-05-18 VITALS — BP 140/74 | HR 73 | Temp 98.7°F | Resp 16 | Ht 70.0 in | Wt >= 6400 oz

## 2017-05-18 DIAGNOSIS — I10 Essential (primary) hypertension: Secondary | ICD-10-CM | POA: Diagnosis not present

## 2017-05-18 DIAGNOSIS — H532 Diplopia: Secondary | ICD-10-CM

## 2017-05-18 NOTE — Patient Instructions (Signed)

## 2017-05-18 NOTE — Progress Notes (Signed)
 Subjective:  Patient ID: Joseph Wood, male    DOB: 07/23/1971  Age: 45 y.o. MRN: [id number]  CC: Hypertension   HPI Joseph Wood presents for f/up - He was seen one day prior to this after brief episode of double vision and blurred vision with mild headache. His symptoms have resolved. An MRI of his brain was normal. He wants to see an eye doctor to have his vision checked. He feels well today and offers no complaints.  Outpatient Medications Prior to Visit  Medication Sig Dispense Refill  . aspirin EC 81 MG tablet Take 1 tablet (81 mg total) by mouth daily. 90 tablet 3  . atorvastatin (LIPITOR) 10 MG tablet TAKE ONE TABLET BY MOUTH ONCE DAILY 30 tablet 11  . Blood Glucose Monitoring Suppl (BAYER CONTOUR NEXT MONITOR) w/Device KIT Use to check blood sugar DX E11.8 1 kit 0  . glucose blood (BAYER CONTOUR NEXT TEST) test strip Use as instructed DX E11.8 100 each 4  . INVOKANA 300 MG TABS tablet TAKE ONE TABLET BY MOUTH ONCE DAILY BEFORE BREAKFAST 90 tablet 1  . Lancets (ONETOUCH ULTRASOFT) lancets Use to test blood sugar tid. DX E11.9 300 each 3  . meloxicam (MOBIC) 15 MG tablet Take 1 tablet (15 mg total) by mouth daily. 90 tablet 1  . metFORMIN (GLUCOPHAGE) 500 MG tablet TAKE ONE TABLET BY MOUTH TWICE DAILY WITH A MEAL 180 tablet 1  . omeprazole (PRILOSEC) 20 MG capsule TAKE ONE CAPSULE BY MOUTH ONCE DAILY 90 capsule 3  . ONETOUCH VERIO test strip TEST UP TO THREE TIMES DAILY 300 each 3  . pregabalin (LYRICA) 75 MG capsule Take 1 capsule (75 mg total) by mouth 3 (three) times daily. 90 capsule 5  . valsartan (DIOVAN) 160 MG tablet TAKE ONE TABLET BY MOUTH ONCE DAILY 90 tablet 1  . methocarbamol (ROBAXIN) 750 MG tablet Take 1 tablet (750 mg total) by mouth every 6 (six) hours as needed for muscle spasms. 270 tablet 1   No facility-administered medications prior to visit.     ROS Review of Systems  Constitutional: Negative.   HENT: Negative.   Eyes: Negative for  photophobia and visual disturbance.  Respiratory: Negative.  Negative for chest tightness and shortness of breath.   Cardiovascular: Negative.  Negative for chest pain, palpitations and leg swelling.  Gastrointestinal: Negative for abdominal pain, constipation, diarrhea, nausea and vomiting.  Endocrine: Negative.   Genitourinary: Negative.  Negative for difficulty urinating.  Musculoskeletal: Negative.   Allergic/Immunologic: Negative.   Neurological: Negative.  Negative for dizziness, seizures, facial asymmetry, weakness, numbness and headaches.  Hematological: Negative.   Psychiatric/Behavioral: Negative.     Objective:  BP 140/74 (BP Location: Left Arm, Patient Position: Sitting, Cuff Size: Large)   Pulse 73   Temp 98.7 F (37.1 C) (Oral)   Resp 16   Ht 5' 10" (1.778 m)   Wt (!) 400 lb 12 oz (181.8 kg)   SpO2 97%   BMI 57.50 kg/m   BP Readings from Last 3 Encounters:  05/18/17 140/74  05/17/17 140/80  01/18/17 110/68    Wt Readings from Last 3 Encounters:  05/18/17 (!) 400 lb 12 oz (181.8 kg)  05/17/17 (!) 403 lb (182.8 kg)  01/18/17 (!) 399 lb 4 oz (181.1 kg)    Physical Exam  Constitutional: He is oriented to person, place, and time. No distress.  HENT:  Mouth/Throat: Oropharynx is clear and moist. No oropharyngeal exudate.  Eyes: Conjunctivae are normal.    Neck: Normal range of motion. Neck supple. No JVD present. No thyromegaly present.  Cardiovascular: Normal rate, regular rhythm and intact distal pulses.  Exam reveals no gallop.   No murmur heard. Pulmonary/Chest: Breath sounds normal. No respiratory distress. He has no wheezes. He has no rales.  Abdominal: Soft. Bowel sounds are normal. He exhibits no distension and no mass. There is no rebound.  Musculoskeletal: Normal range of motion. He exhibits no edema, tenderness or deformity.  Neurological: He is alert and oriented to person, place, and time. He has normal reflexes. He displays normal reflexes. No  cranial nerve deficit. He exhibits normal muscle tone. Coordination normal.  Skin: Skin is warm and dry. No rash noted. He is not diaphoretic. No erythema. No pallor.  Psychiatric: He has a normal mood and affect. His behavior is normal. Judgment and thought content normal.  Vitals reviewed.   Lab Results  Component Value Date   WBC 9.5 09/28/2016   HGB 15.7 09/28/2016   HCT 47.5 09/28/2016   PLT 257.0 09/28/2016   GLUCOSE 124 (H) 09/28/2016   CHOL 169 04/06/2016   TRIG 160.0 (H) 04/06/2016   HDL 48.00 04/06/2016   LDLCALC 89 04/06/2016   ALT 25 09/28/2016   AST 19 09/28/2016   NA 143 09/28/2016   K 3.7 09/28/2016   CL 107 09/28/2016   CREATININE 1.03 09/28/2016   BUN 16 09/28/2016   CO2 28 09/28/2016   TSH 1.58 04/06/2016   PSA 1.01 06/23/2015   HGBA1C 6.4 01/18/2017   MICROALBUR <0.7 04/06/2016    Mr Brain Wo Contrast  Result Date: 05/17/2017 CLINICAL DATA:  Blurred vision and headache EXAM: MRI HEAD WITHOUT CONTRAST TECHNIQUE: Multiplanar, multiecho pulse sequences of the brain and surrounding structures were obtained without intravenous contrast. COMPARISON:  None. FINDINGS: Brain: The midline structures are normal. There is no focal diffusion restriction to indicate acute infarct. The brain parenchymal signal is normal and there is no mass lesion. No intraparenchymal hematoma or chronic microhemorrhage. Brain volume is normal for age without age-advanced or lobar predominant atrophy. The dura is normal and there is no extra-axial collection. Vascular: Major intracranial arterial and venous sinus flow voids are preserved. Skull and upper cervical spine: The visualized skull base, calvarium, upper cervical spine and extracranial soft tissues are normal. Sinuses/Orbits: No fluid levels or advanced mucosal thickening. No mastoid effusion. Normal orbits. IMPRESSION: Normal MRI of the brain for age. Electronically Signed   By: Ulyses Jarred M.D.   On: 05/17/2017 19:20    Assessment  & Plan:   Joseph Wood was seen today for hypertension.  Diagnoses and all orders for this visit:  Double vision with both eyes open- his symptoms have resolved, his MRI was normal, will refer to ophthalmology for formal eye exam. -     Ambulatory referral to Ophthalmology  HYPERTENSION, BENIGN ESSENTIAL- his blood pressure is adequately well controlled.   I have discontinued Joseph Wood methocarbamol. I am also having him maintain his aspirin EC, BAYER CONTOUR NEXT MONITOR, glucose blood, INVOKANA, meloxicam, valsartan, omeprazole, ONETOUCH VERIO, onetouch ultrasoft, metFORMIN, pregabalin, and atorvastatin.  No orders of the defined types were placed in this encounter.    Follow-up: Return in about 3 months (around 08/18/2017).  Scarlette Calico, MD

## 2017-06-01 ENCOUNTER — Other Ambulatory Visit: Payer: Self-pay | Admitting: Internal Medicine

## 2017-06-01 DIAGNOSIS — M150 Primary generalized (osteo)arthritis: Secondary | ICD-10-CM

## 2017-06-01 DIAGNOSIS — M159 Polyosteoarthritis, unspecified: Secondary | ICD-10-CM

## 2017-06-01 DIAGNOSIS — M8949 Other hypertrophic osteoarthropathy, multiple sites: Secondary | ICD-10-CM

## 2017-06-01 DIAGNOSIS — G8929 Other chronic pain: Secondary | ICD-10-CM

## 2017-06-01 DIAGNOSIS — M545 Low back pain: Principal | ICD-10-CM

## 2017-06-30 ENCOUNTER — Other Ambulatory Visit: Payer: Self-pay | Admitting: Internal Medicine

## 2017-06-30 ENCOUNTER — Telehealth: Payer: Self-pay | Admitting: Internal Medicine

## 2017-06-30 DIAGNOSIS — I10 Essential (primary) hypertension: Secondary | ICD-10-CM

## 2017-06-30 DIAGNOSIS — E118 Type 2 diabetes mellitus with unspecified complications: Secondary | ICD-10-CM

## 2017-06-30 MED ORDER — TELMISARTAN 80 MG PO TABS: 80.0000 mg | ORAL_TABLET | Freq: Every day | ORAL | 1 refills | Status: DC

## 2017-06-30 NOTE — Telephone Encounter (Signed)
RX sent

## 2017-06-30 NOTE — Telephone Encounter (Signed)
Patient requesting script in place of valsartan.  Patient uses Paediatric nurse at [facility].  Please follow up with patient in regard.  Patient is leaving to go out of town Saturday morning.  Is requesting to get new medication as soon as possible.

## 2017-07-01 NOTE — Telephone Encounter (Signed)
Pt informed of new rx.

## 2017-07-18 ENCOUNTER — Ambulatory Visit: Payer: 59 | Admitting: Internal Medicine

## 2017-07-18 ENCOUNTER — Encounter: Payer: 59 | Admitting: Internal Medicine

## 2017-08-01 ENCOUNTER — Other Ambulatory Visit (INDEPENDENT_AMBULATORY_CARE_PROVIDER_SITE_OTHER): Payer: 59

## 2017-08-01 ENCOUNTER — Ambulatory Visit (INDEPENDENT_AMBULATORY_CARE_PROVIDER_SITE_OTHER): Payer: 59 | Admitting: Internal Medicine

## 2017-08-01 ENCOUNTER — Encounter: Payer: Self-pay | Admitting: Internal Medicine

## 2017-08-01 VITALS — BP 128/80 | HR 78 | Temp 97.6°F | Resp 16 | Ht 70.0 in | Wt >= 6400 oz

## 2017-08-01 DIAGNOSIS — N393 Stress incontinence (female) (male): Secondary | ICD-10-CM

## 2017-08-01 DIAGNOSIS — Z0000 Encounter for general adult medical examination without abnormal findings: Secondary | ICD-10-CM

## 2017-08-01 DIAGNOSIS — B354 Tinea corporis: Secondary | ICD-10-CM

## 2017-08-01 DIAGNOSIS — I10 Essential (primary) hypertension: Secondary | ICD-10-CM

## 2017-08-01 DIAGNOSIS — E118 Type 2 diabetes mellitus with unspecified complications: Secondary | ICD-10-CM | POA: Diagnosis not present

## 2017-08-01 LAB — COMPREHENSIVE METABOLIC PANEL
ALT: 22 U/L (ref 0–53)
AST: 16 U/L (ref 0–37)
Albumin: 3.8 g/dL (ref 3.5–5.2)
Alkaline Phosphatase: 63 U/L (ref 39–117)
BILIRUBIN TOTAL: 0.6 mg/dL (ref 0.2–1.2)
BUN: 20 mg/dL (ref 6–23)
CHLORIDE: 105 meq/L (ref 96–112)
CO2: 31 meq/L (ref 19–32)
Calcium: 9.7 mg/dL (ref 8.4–10.5)
Creatinine, Ser: 0.96 mg/dL (ref 0.40–1.50)
GFR: 89.58 mL/min (ref >60.00)
GLUCOSE: 117 mg/dL — AB (ref 70–99)
Potassium: 4.8 mEq/L (ref 3.5–5.1)
Sodium: 141 mEq/L (ref 135–145)
Total Protein: 7.2 g/dL (ref 6.0–8.3)

## 2017-08-01 LAB — CBC WITH DIFFERENTIAL/PLATELET
BASOS ABS: 0 10*3/uL (ref 0.0–0.1)
BASOS PCT: 0.7 % (ref 0.0–3.0)
Eosinophils Absolute: 0.2 10*3/uL (ref 0.0–0.7)
Eosinophils Relative: 2.7 % (ref 0.0–5.0)
HEMATOCRIT: 49.5 % (ref 39.0–52.0)
Hemoglobin: 16.3 g/dL (ref 13.0–17.0)
LYMPHS ABS: 1.9 10*3/uL (ref 0.7–4.0)
LYMPHS PCT: 27 % (ref 12.0–46.0)
MCHC: 33 g/dL (ref 30.0–36.0)
MCV: 91.4 fl (ref 78.0–100.0)
MONOS PCT: 8.6 % (ref 3.0–12.0)
Monocytes Absolute: 0.6 10*3/uL (ref 0.1–1.0)
NEUTROS ABS: 4.2 10*3/uL (ref 1.4–7.7)
NEUTROS PCT: 61 % (ref 43.0–77.0)
PLATELETS: 236 10*3/uL (ref 150.0–400.0)
RBC: 5.41 Mil/uL (ref 4.22–5.81)
RDW: 15.4 % (ref 11.5–15.5)
WBC: 6.9 10*3/uL (ref 4.0–10.5)

## 2017-08-01 LAB — URINALYSIS, ROUTINE W REFLEX MICROSCOPIC
BILIRUBIN URINE: NEGATIVE
HGB URINE DIPSTICK: NEGATIVE
Ketones, ur: NEGATIVE
Leukocytes, UA: NEGATIVE
NITRITE: NEGATIVE
Specific Gravity, Urine: 1.015 (ref 1.000–1.030)
Total Protein, Urine: NEGATIVE
UROBILINOGEN UA: 0.2 (ref 0.0–1.0)
pH: 5.5 (ref 5.0–8.0)

## 2017-08-01 LAB — MICROALBUMIN / CREATININE URINE RATIO
Creatinine,U: 68.8 mg/dL
Microalb Creat Ratio: 1 mg/g (ref 0.0–30.0)
Microalb, Ur: <0.7 mg/dL (ref 0.0–1.9)

## 2017-08-01 LAB — HEMOGLOBIN A1C: Hgb A1c MFr Bld: 6.6 % — ABNORMAL HIGH (ref 4.6–6.5)

## 2017-08-01 LAB — LIPID PANEL
Cholesterol: 141 mg/dL (ref 0–200)
HDL: 50.7 mg/dL (ref >39.00)
LDL Cholesterol: 74 mg/dL (ref 0–99)
NONHDL: 89.85
TRIGLYCERIDES: 81 mg/dL (ref 0.0–149.0)
Total CHOL/HDL Ratio: 3
VLDL: 16.2 mg/dL (ref 0.0–40.0)

## 2017-08-01 LAB — TSH: TSH: 2.45 u[IU]/mL (ref 0.35–4.50)

## 2017-08-01 MED ORDER — CICLOPIROX OLAMINE 0.77 % EX CREA: TOPICAL_CREAM | Freq: Two times a day (BID) | CUTANEOUS | 1 refills | Status: DC

## 2017-08-01 NOTE — Patient Instructions (Signed)

## 2017-08-01 NOTE — Progress Notes (Signed)
Subjective:  Patient ID: Joseph Wood, male    DOB: 03-20-1971  Age: 46 y.o. MRN: [id number]  CC: Annual Exam; Hypertension; and Diabetes   HPI Joseph Wood presents for a CPX.  He complains that when his bladder gets full he has urgency and has had some accidents and incontinence, mostly  during the day but some at night as well. He complains of polyuria but he denies dysuria or hematuria.  He also complains of a several week history of an asymptomatic brown patch in his right axilla.  Outpatient Medications Prior to Visit  Medication Sig Dispense Refill  . aspirin EC 81 MG tablet Take 1 tablet (81 mg total) by mouth daily. 90 tablet 3  . atorvastatin (LIPITOR) 10 MG tablet TAKE ONE TABLET BY MOUTH ONCE DAILY 30 tablet 11  . Blood Glucose Monitoring Suppl (BAYER CONTOUR NEXT MONITOR) w/Device KIT Use to check blood sugar DX E11.8 1 kit 0  . glucose blood (BAYER CONTOUR NEXT TEST) test strip Use as instructed DX E11.8 100 each 4  . Lancets (ONETOUCH ULTRASOFT) lancets Use to test blood sugar tid. DX E11.9 300 each 3  . meloxicam (MOBIC) 15 MG tablet TAKE ONE TABLET BY MOUTH ONCE DAILY 90 tablet 1  . metFORMIN (GLUCOPHAGE) 500 MG tablet TAKE ONE TABLET BY MOUTH TWICE DAILY WITH A MEAL 180 tablet 1  . omeprazole (PRILOSEC) 20 MG capsule TAKE ONE CAPSULE BY MOUTH ONCE DAILY 90 capsule 3  . ONETOUCH VERIO test strip TEST UP TO THREE TIMES DAILY 300 each 3  . pregabalin (LYRICA) 75 MG capsule Take 1 capsule (75 mg total) by mouth 3 (three) times daily. 90 capsule 5  . telmisartan (MICARDIS) 80 MG tablet Take 1 tablet (80 mg total) by mouth daily. 90 tablet 1  . INVOKANA 300 MG TABS tablet TAKE ONE TABLET BY MOUTH ONCE DAILY BEFORE BREAKFAST 90 tablet 1   No facility-administered medications prior to visit.     ROS Review of Systems  Constitutional: Negative.  Negative for appetite change, diaphoresis, fatigue and unexpected weight change.  HENT: Negative.  Negative for  trouble swallowing.   Eyes: Negative for visual disturbance.  Respiratory: Negative.  Negative for cough, chest tightness, shortness of breath and wheezing.   Cardiovascular: Negative for chest pain, palpitations and leg swelling.  Gastrointestinal: Negative for abdominal pain, constipation, diarrhea, nausea and vomiting.  Endocrine: Positive for polyuria. Negative for polydipsia and polyphagia.  Genitourinary: Positive for enuresis, frequency and urgency. Negative for difficulty urinating, hematuria, scrotal swelling and testicular pain.  Musculoskeletal: Negative.  Negative for back pain and myalgias.  Skin: Negative.  Negative for color change and rash.  Allergic/Immunologic: Negative.   Neurological: Negative.  Negative for dizziness, weakness and headaches.  Hematological: Negative.  Negative for adenopathy. Does not bruise/bleed easily.  Psychiatric/Behavioral: Negative.     Objective:  BP 128/80 (BP Location: Left Arm, Patient Position: Sitting, Cuff Size: Large)   Pulse 78   Temp 97.6 F (36.4 C) (Oral)   Resp 16   Ht 5' 10"  (1.778 m)   Wt (!) 402 lb 12 oz (182.7 kg)   SpO2 98%   BMI 57.79 kg/m   BP Readings from Last 3 Encounters:  08/01/17 128/80  05/18/17 140/74  05/17/17 140/80    Wt Readings from Last 3 Encounters:  08/01/17 (!) 402 lb 12 oz (182.7 kg)  05/18/17 (!) 400 lb 12 oz (181.8 kg)  05/17/17 (!) 403 lb (182.8 kg)  Physical Exam  Constitutional: He is oriented to person, place, and time. No distress.  HENT:  Mouth/Throat: Oropharynx is clear and moist. No oropharyngeal exudate.  Eyes: Conjunctivae are normal. Right eye exhibits no discharge. Left eye exhibits no discharge. No scleral icterus.  Neck: Normal range of motion. Neck supple. No JVD present. No thyromegaly present.  Cardiovascular: Normal rate, regular rhythm and intact distal pulses.  Exam reveals no gallop and no friction rub.   No murmur heard. Pulmonary/Chest: Effort normal and breath  sounds normal. No respiratory distress. He has no wheezes. He has no rales. He exhibits no tenderness.  Abdominal: Soft. Bowel sounds are normal. He exhibits no distension and no mass. There is no tenderness. There is no rebound and no guarding.  Musculoskeletal: Normal range of motion. He exhibits no edema, tenderness or deformity.  Lymphadenopathy:    He has no cervical adenopathy.  Neurological: He is alert and oriented to person, place, and time.  Skin: Skin is warm and dry. No rash noted. He is not diaphoretic. No erythema. No pallor.  Right axilla reveals a brown, confluent, 3 cm patch. There is no scale or erythema.  Psychiatric: He has a normal mood and affect. His behavior is normal. Judgment and thought content normal.  Vitals reviewed.   Lab Results  Component Value Date   WBC 9.5 09/28/2016   HGB 15.7 09/28/2016   HCT 47.5 09/28/2016   PLT 257.0 09/28/2016   GLUCOSE 124 (H) 09/28/2016   CHOL 169 04/06/2016   TRIG 160.0 (H) 04/06/2016   HDL 48.00 04/06/2016   LDLCALC 89 04/06/2016   ALT 25 09/28/2016   AST 19 09/28/2016   NA 143 09/28/2016   K 3.7 09/28/2016   CL 107 09/28/2016   CREATININE 1.03 09/28/2016   BUN 16 09/28/2016   CO2 28 09/28/2016   TSH 1.58 04/06/2016   PSA 1.01 06/23/2015   HGBA1C 6.4 01/18/2017   MICROALBUR <0.7 04/06/2016    Mr Brain Wo Contrast  Result Date: 05/17/2017 CLINICAL DATA:  Blurred vision and headache EXAM: MRI HEAD WITHOUT CONTRAST TECHNIQUE: Multiplanar, multiecho pulse sequences of the brain and surrounding structures were obtained without intravenous contrast. COMPARISON:  None. FINDINGS: Brain: The midline structures are normal. There is no focal diffusion restriction to indicate acute infarct. The brain parenchymal signal is normal and there is no mass lesion. No intraparenchymal hematoma or chronic microhemorrhage. Brain volume is normal for age without age-advanced or lobar predominant atrophy. The dura is normal and there is no  extra-axial collection. Vascular: Major intracranial arterial and venous sinus flow voids are preserved. Skull and upper cervical spine: The visualized skull base, calvarium, upper cervical spine and extracranial soft tissues are normal. Sinuses/Orbits: No fluid levels or advanced mucosal thickening. No mastoid effusion. Normal orbits. IMPRESSION: Normal MRI of the brain for age. Electronically Signed   By: Ulyses Jarred M.D.   On: 05/17/2017 19:20    Assessment & Plan:   Domnic was seen today for annual exam, hypertension and diabetes.  Diagnoses and all orders for this visit:  SUI (stress urinary incontinence), male- there is a lot of glucose in his urine which may be contributing to this so I have asked him to stop taking the SGLT2 inhibitor. Will also ask him to see urology to see if there are additional treatment options. -     Ambulatory referral to Urology  Type 2 diabetes mellitus with complication, without long-term current use of insulin ([place])- his A1c is down  to 6.6. Will stop the SGLT2 inhibitor. Will continue metformin. -     Microalbumin / creatinine urine ratio; Future -     Hemoglobin A1c; Future  Routine general medical examination at a health care facility -     Comprehensive metabolic panel; Future -     CBC with Differential/Platelet; Future -     Lipid panel; Future -     TSH; Future  Tinea corporis- will empirically treat with ciclopirox. If this doesn't resolve soon then I have asked him to return and will consider doing a biopsy for diagnostic purposes. -     ciclopirox (LOPROX) 0.77 % cream; Apply topically 2 (two) times daily.  HYPERTENSION, BENIGN ESSENTIAL- his blood pressure is adequately well-controlled, electrolytes and renal function are normal. -     Urinalysis, Routine w reflex microscopic; Future   I have discontinued Mr. Hamza Empson. I am also having him start on ciclopirox. Additionally, I am having him maintain his aspirin EC, BAYER CONTOUR  NEXT MONITOR, glucose blood, omeprazole, ONETOUCH VERIO, onetouch ultrasoft, metFORMIN, pregabalin, atorvastatin, meloxicam, and telmisartan.  Meds ordered this encounter  Medications  . ciclopirox (LOPROX) 0.77 % cream    Sig: Apply topically 2 (two) times daily.    Dispense:  90 g    Refill:  1     Follow-up: No Follow-up on file.  Scarlette Calico, MD

## 2017-08-02 ENCOUNTER — Encounter: Payer: Self-pay | Admitting: Internal Medicine

## 2017-08-25 ENCOUNTER — Encounter: Payer: Self-pay | Admitting: Internal Medicine

## 2017-08-29 ENCOUNTER — Encounter: Payer: Self-pay | Admitting: Internal Medicine

## 2017-08-29 ENCOUNTER — Ambulatory Visit (INDEPENDENT_AMBULATORY_CARE_PROVIDER_SITE_OTHER): Payer: 59 | Admitting: Internal Medicine

## 2017-08-29 VITALS — BP 110/70 | HR 78 | Temp 98.6°F | Resp 16 | Ht 70.0 in | Wt >= 6400 oz

## 2017-08-29 DIAGNOSIS — L83 Acanthosis nigricans: Secondary | ICD-10-CM | POA: Diagnosis not present

## 2017-08-29 DIAGNOSIS — N393 Stress incontinence (female) (male): Secondary | ICD-10-CM | POA: Diagnosis not present

## 2017-08-29 DIAGNOSIS — I10 Essential (primary) hypertension: Secondary | ICD-10-CM | POA: Diagnosis not present

## 2017-08-29 DIAGNOSIS — E118 Type 2 diabetes mellitus with unspecified complications: Secondary | ICD-10-CM | POA: Diagnosis not present

## 2017-08-29 DIAGNOSIS — Z23 Encounter for immunization: Secondary | ICD-10-CM | POA: Diagnosis not present

## 2017-08-29 NOTE — Progress Notes (Signed)
Subjective:  Patient ID: Joseph Wood, male    DOB: [birth year]  Age: 46 y.o. MRN: [id number]  CC: Hypertension and Diabetes   HPI Joseph Wood presents for f/up regarding asx rash in his right axilla, the rash did not disappear after weeks of using loprox. His urinary sx's have improved significantly with the cessation of SGLT-2 therapy. He complains of premature ejaculation and will see urology soon.  Outpatient Medications Prior to Visit  Medication Sig Dispense Refill  . aspirin EC 81 MG tablet Take 1 tablet (81 mg total) by mouth daily. 90 tablet 3  . atorvastatin (LIPITOR) 10 MG tablet TAKE ONE TABLET BY MOUTH ONCE DAILY 30 tablet 11  . Blood Glucose Monitoring Suppl (BAYER CONTOUR NEXT MONITOR) w/Device KIT Use to check blood sugar DX E11.8 1 kit 0  . ciclopirox (LOPROX) 0.77 % cream Apply topically 2 (two) times daily. 90 g 1  . glucose blood (BAYER CONTOUR NEXT TEST) test strip Use as instructed DX E11.8 100 each 4  . Lancets (ONETOUCH ULTRASOFT) lancets Use to test blood sugar tid. DX E11.9 300 each 3  . meloxicam (MOBIC) 15 MG tablet TAKE ONE TABLET BY MOUTH ONCE DAILY 90 tablet 1  . metFORMIN (GLUCOPHAGE) 500 MG tablet TAKE ONE TABLET BY MOUTH TWICE DAILY WITH A MEAL 180 tablet 1  . omeprazole (PRILOSEC) 20 MG capsule TAKE ONE CAPSULE BY MOUTH ONCE DAILY 90 capsule 3  . ONETOUCH VERIO test strip TEST UP TO THREE TIMES DAILY 300 each 3  . pregabalin (LYRICA) 75 MG capsule Take 1 capsule (75 mg total) by mouth 3 (three) times daily. 90 capsule 5  . telmisartan (MICARDIS) 80 MG tablet Take 1 tablet (80 mg total) by mouth daily. 90 tablet 1   No facility-administered medications prior to visit.     ROS Review of Systems  Constitutional: Negative.  Negative for diaphoresis, fatigue and unexpected weight change.  HENT: Negative.   Eyes: Negative.  Negative for visual disturbance.  Respiratory: Negative.  Negative for cough, chest tightness, shortness of breath and  wheezing.   Cardiovascular: Negative for chest pain, palpitations and leg swelling.  Gastrointestinal: Negative for abdominal pain, constipation, diarrhea, nausea and vomiting.  Endocrine: Negative for polydipsia, polyphagia and polyuria.  Genitourinary: Negative for decreased urine volume, difficulty urinating, dysuria and urgency.  Musculoskeletal: Negative.  Negative for back pain, myalgias and neck pain.  Skin: Positive for color change and rash.  Allergic/Immunologic: Negative.   Neurological: Negative.   Hematological: Negative for adenopathy. Does not bruise/bleed easily.  Psychiatric/Behavioral: Negative.     Objective:  BP 110/70 (BP Location: Left Arm, Patient Position: Sitting, Cuff Size: Large)   Pulse 78   Temp 98.6 F (37 C) (Oral)   Resp 16   Ht 5' 10"  (1.778 m)   Wt (!) 408 lb (185.1 kg)   SpO2 99%   BMI 58.54 kg/m   BP Readings from Last 3 Encounters:  08/29/17 110/70  08/01/17 128/80  05/18/17 140/74    Wt Readings from Last 3 Encounters:  08/29/17 (!) 408 lb (185.1 kg)  08/01/17 (!) 402 lb 12 oz (182.7 kg)  05/18/17 (!) 400 lb 12 oz (181.8 kg)    Physical Exam  Constitutional: He is oriented to person, place, and time. No distress.  HENT:  Mouth/Throat: Oropharynx is clear and moist. No oropharyngeal exudate.  Eyes: Conjunctivae are normal. Right eye exhibits no discharge. Left eye exhibits no discharge. No scleral icterus.  Neck: Normal range  of motion. Neck supple. No JVD present. No thyromegaly present.  Cardiovascular: Normal rate, regular rhythm and intact distal pulses.  Exam reveals no gallop and no friction rub.   No murmur heard. Pulmonary/Chest: Effort normal and breath sounds normal. No respiratory distress. He has no wheezes. He has no rales. He exhibits no tenderness.  Abdominal: Soft. Bowel sounds are normal. He exhibits no distension and no mass. There is no tenderness. There is no rebound and no guarding.  Musculoskeletal: Normal range  of motion. He exhibits no edema, tenderness or deformity.  Lymphadenopathy:    He has no cervical adenopathy.  Neurological: He is alert and oriented to person, place, and time.  Skin: Rash noted. No abrasion, no lesion, no petechiae and no purpura noted. Rash is macular. Rash is not papular, not maculopapular, not nodular, not pustular, not vesicular and not urticarial. He is not diaphoretic. No erythema.  Rt axilla reveals a 3 cm round, flat hyperpigmented macule c/w acanthosis nigricans  Vitals reviewed.   Lab Results  Component Value Date   WBC 6.9 08/01/2017   HGB 16.3 08/01/2017   HCT 49.5 08/01/2017   PLT 236.0 08/01/2017   GLUCOSE 117 (H) 08/01/2017   CHOL 141 08/01/2017   TRIG 81.0 08/01/2017   HDL 50.70 08/01/2017   LDLCALC 74 08/01/2017   ALT 22 08/01/2017   AST 16 08/01/2017   NA 141 08/01/2017   K 4.8 08/01/2017   CL 105 08/01/2017   CREATININE 0.96 08/01/2017   BUN 20 08/01/2017   CO2 31 08/01/2017   TSH 2.45 08/01/2017   PSA 1.01 06/23/2015   HGBA1C 6.6 (H) 08/01/2017   MICROALBUR <0.7 08/01/2017    Mr Brain Wo Contrast  Result Date: 05/17/2017 CLINICAL DATA:  Blurred vision and headache EXAM: MRI HEAD WITHOUT CONTRAST TECHNIQUE: Multiplanar, multiecho pulse sequences of the brain and surrounding structures were obtained without intravenous contrast. COMPARISON:  None. FINDINGS: Brain: The midline structures are normal. There is no focal diffusion restriction to indicate acute infarct. The brain parenchymal signal is normal and there is no mass lesion. No intraparenchymal hematoma or chronic microhemorrhage. Brain volume is normal for age without age-advanced or lobar predominant atrophy. The dura is normal and there is no extra-axial collection. Vascular: Major intracranial arterial and venous sinus flow voids are preserved. Skull and upper cervical spine: The visualized skull base, calvarium, upper cervical spine and extracranial soft tissues are normal.  Sinuses/Orbits: No fluid levels or advanced mucosal thickening. No mastoid effusion. Normal orbits. IMPRESSION: Normal MRI of the brain for age. Electronically Signed   By: Ulyses Jarred M.D.   On: 05/17/2017 19:20    Assessment & Plan:   Kru was seen today for hypertension and diabetes.  Diagnoses and all orders for this visit:  Need for influenza vaccination -     Flu Vaccine QUAD 36+ mos IM  HYPERTENSION, BENIGN ESSENTIAL- his BP is well controlled  SUI (stress urinary incontinence), male- he agrees to see urology soon  Type 2 diabetes mellitus with complication, without long-term current use of insulin ([place])- his BS's are adequately well controlled  Acanthosis nigricans, acquired- reassurance offered, no tx indicated   I am having Mr. Azimi maintain his aspirin EC, BAYER CONTOUR NEXT MONITOR, glucose blood, omeprazole, ONETOUCH VERIO, onetouch ultrasoft, metFORMIN, pregabalin, atorvastatin, meloxicam, telmisartan, and ciclopirox.  No orders of the defined types were placed in this encounter.    Follow-up: No Follow-up on file.  Scarlette Calico, MD

## 2017-08-29 NOTE — Patient Instructions (Signed)

## 2017-09-05 ENCOUNTER — Other Ambulatory Visit: Payer: Self-pay | Admitting: Internal Medicine

## 2017-09-20 ENCOUNTER — Encounter: Payer: Self-pay | Admitting: Internal Medicine

## 2017-10-07 ENCOUNTER — Encounter: Payer: Self-pay | Admitting: Family Medicine

## 2017-10-07 ENCOUNTER — Ambulatory Visit (INDEPENDENT_AMBULATORY_CARE_PROVIDER_SITE_OTHER): Payer: 59 | Admitting: Family Medicine

## 2017-10-07 VITALS — BP 136/72 | HR 81 | Temp 97.7°F | Ht 70.0 in | Wt >= 6400 oz

## 2017-10-07 DIAGNOSIS — M79672 Pain in left foot: Secondary | ICD-10-CM | POA: Diagnosis not present

## 2017-10-07 DIAGNOSIS — M722 Plantar fascial fibromatosis: Secondary | ICD-10-CM | POA: Insufficient documentation

## 2017-10-07 NOTE — Patient Instructions (Addendum)
Thank you for coming in,   Please try the different exercise provided.  Please try to obtain a gel heel cup and a midfoot arch strap.  If these things do not improve the pain then we can consider custom orthotics   Please feel free to call with any questions or concerns at any time, at [phone]. --Dr. Raeford Razor

## 2017-10-07 NOTE — Assessment & Plan Note (Signed)
Pain seems to be somewhat associated with plantar fasciitis. He has significant structural changes with his feet with loss of his transverse and longitudinal arch. Also having degenerative changes of the midfoot - Encourage midfoot arch strap, heel cups - Counseled on exercises - If no improvement consider plantar fascia injection and/or custom orthotics

## 2017-10-07 NOTE — Assessment & Plan Note (Signed)
Counseled today on weight loss. He reports his insurance may change to beginning of the year that would make a referral for bariatric surgery again.

## 2017-10-07 NOTE — Progress Notes (Signed)
Joseph Wood - 46 y.o. male MRN [id number]  Date of birth: 1971/07/19  SUBJECTIVE:  Including CC & ROS.  Chief Complaint  Patient presents with  . Left heel pain    patient states pain has increased over the past two weeks. He has been taking Aleve for the pain, has not improved. He thinks it may be a bone spur.    Joseph Wood is a 46 y.o. male that is presenting with left plantar heel pain.  Pain is acute on chronic in nature.  The pain is exacerbated with standing all day at work as well as worse in the morning.  The pain is sharp and stabbing in nature.  The pain is localized in nature.  He reports a distant history of symptoms suggestive of a plantar fascial rupture.  Denies any surgery on his foot.surgery on his foot.  Has a history of morbid obesity. Has tried going for bariatric surgery but this was not approved by his insurance. Has watched what he eats and exercises but still has problems. He has diabetes as well as sleep apnea  Independent review of left foot x-ray from October 2016 show significant derangement of the midfoot consistent with a plantar heel spur.  He was seen by podiatry in 2016 and provided injections and orthotics.  Review of Systems  Musculoskeletal: Positive for gait problem.  Skin: Negative for color change.  Neurological: Negative for weakness and numbness.  Hematological: Negative for adenopathy.    HISTORY: Past Medical, Surgical, Social, and Family History Reviewed & Updated per EMR.   Pertinent Historical Findings include:  Past Medical History:  Diagnosis Date  . Diabetes mellitus without complication ([place])   . Hypertension   . Obesity   . Sleep apnea     Past Surgical History:  Procedure Laterality Date  . acl repair     Right  . COLONOSCOPY WITH PROPOFOL  11/06/2012   Procedure: COLONOSCOPY WITH PROPOFOL;  Surgeon: Jerene Bears, MD;  Location: [facility];  Service: Gastroenterology;  Laterality: N/A;  [place]rea/    Allergies    Allergen Reactions  . Benazepril Hcl Cough  . Tramadol Other (See Comments)    dizziness    Family History  Problem Relation Age of Onset  . Arthritis Mother   . Hypertension Mother   . Breast cancer Mother   . Diabetes Mother   . Aneurysm Father        aortic  . Prostate cancer Father   . Hypertension Father   . Diabetes Father   . Hypertension Sister   . Rheum arthritis Sister   . Hypertension Brother      Social History   Social History  . Marital status: Divorced    Spouse name: N/A  . Number of children: 1  . Years of education: N/A   Occupational History  . shipping [employer]   Social History Main Topics  . Smoking status: Never Smoker  . Smokeless tobacco: Never Used  . Alcohol use No  . Drug use: No  . Sexual activity: Yes   Other Topics Concern  . 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  He has one daughter. Lives in one story home.    Highest level of education: high school   Regular exercise-no     PHYSICAL EXAM:  VS: BP 136/72 (BP  Location: Left Arm, Patient Position: Sitting, Cuff Size: Large)   Pulse 81   Temp 97.7 F (36.5 C) (Oral)   Ht 5' 10"  (1.778 m)   Wt (!) 419 lb (190.1 kg)   SpO2 98%   BMI 60.12 kg/m  Physical Exam Gen: NAD, alert, cooperative with exam,Morbidly obese ENT: normal lips, normal nasal mucosa,  Eye: normal EOM, normal conjunctiva and lids CV:  no edema, +2 pedal pulses   Resp: no accessory muscle use, non-labored,  Skin: no rashes, no areas of induration  Neuro: normal tone, normal sensation to touch Psych:  normal insight, alert and oriented MSK:  Left foot: Significant changes of the midfoot   having rotation of the midfoot with the great toe being rotated   no tenderness to palpation of the Achilles at the insertion. Some tenderness to palpation over the fat pad and origin of  the plantar fascia. Normal ankle range of motion. Normal strength resistance. Neurovascularly intact       ASSESSMENT & PLAN:   Obesity, morbid, BMI 50 or higher ([place]) Counseled today on weight loss. He reports his insurance may change to beginning of the year that would make a referral for bariatric surgery again.  Left foot pain Pain seems to be somewhat associated with plantar fasciitis. He has significant structural changes with his feet with loss of his transverse and longitudinal arch. Also having degenerative changes of the midfoot - Encourage midfoot arch strap, heel cups - Counseled on exercises - If no improvement consider plantar fascia injection and/or custom orthotics

## 2017-10-13 ENCOUNTER — Ambulatory Visit: Payer: 59 | Admitting: Internal Medicine

## 2017-10-25 ENCOUNTER — Telehealth: Payer: Self-pay | Admitting: Family Medicine

## 2017-10-25 NOTE — Telephone Encounter (Signed)
Patient called stating that he has not had any relief since he was seen by Dr Raeford Razor on 10/26 for a cortisone injection in his heel. He has been doing all of the exercises that were recommended but does not know what else to do. Please advise.

## 2017-10-25 NOTE — Telephone Encounter (Signed)
Patient states no improvement in left heel pain with exercises provided. Prefers to follow up with Dr. Raeford Razor for possible heel injection/orthotics. Appointment made 10/26/17.

## 2017-10-26 ENCOUNTER — Ambulatory Visit (INDEPENDENT_AMBULATORY_CARE_PROVIDER_SITE_OTHER): Payer: 59 | Admitting: Family Medicine

## 2017-10-26 ENCOUNTER — Encounter: Payer: Self-pay | Admitting: Family Medicine

## 2017-10-26 VITALS — BP 136/78 | HR 77 | Temp 98.6°F | Ht 70.0 in | Wt >= 6400 oz

## 2017-10-26 DIAGNOSIS — M150 Primary generalized (osteo)arthritis: Secondary | ICD-10-CM | POA: Diagnosis not present

## 2017-10-26 DIAGNOSIS — M8949 Other hypertrophic osteoarthropathy, multiple sites: Secondary | ICD-10-CM

## 2017-10-26 DIAGNOSIS — G8929 Other chronic pain: Secondary | ICD-10-CM | POA: Diagnosis not present

## 2017-10-26 DIAGNOSIS — M722 Plantar fascial fibromatosis: Secondary | ICD-10-CM

## 2017-10-26 DIAGNOSIS — M159 Polyosteoarthritis, unspecified: Secondary | ICD-10-CM

## 2017-10-26 DIAGNOSIS — M545 Low back pain: Secondary | ICD-10-CM

## 2017-10-26 MED ORDER — MELOXICAM 15 MG PO TABS: 15.0000 mg | ORAL_TABLET | Freq: Every day | ORAL | 1 refills | Status: DC

## 2017-10-26 NOTE — Patient Instructions (Signed)
Thank you for coming in,   If this doesn't get better then we can try physical therapy.   If you want a new custom orthotic then I can send you there.     Please feel free to call with any questions or concerns at any time, at [phone]. --Dr. Raeford Razor

## 2017-10-26 NOTE — Progress Notes (Signed)
Joseph Wood - 46 y.o. male MRN [id number]  Date of birth: 27-Dec-1970  SUBJECTIVE:  Including CC & ROS.  Chief Complaint  Patient presents with  . Left Heel pain    no improvement with pain. He has been doing his exercises, aleve as needed for pain.     [name] is a 46 y.o. male that is  Following up for his ongoing left heel pain. The pain has been occurring for a few months now. The pain is worse with standing. He denies any improvement with the exercises. Has been using the mid foot arch strap and gel heel cups. The pain is worse at the sole of the heel and the medial aspect of the heel. His symptoms first started after he was walking at the [facility]. He denies any injury. Has been intermittent in nature. He is on his feet for most of the day. Reports that the three-quarter length orthotics worked the best and improved his pain the best.   Independent review of the left foot x-ray from October 2016 shows significant drainage. Of the midfoot consistent with plantar heel spur.  He was seen by podiatry in 2016 provided injections and orthotics   Review of Systems  Constitutional: Negative for fever.  Musculoskeletal: Positive for gait problem.  Skin: Negative for color change.  Neurological: Negative for weakness and numbness.  Hematological: Negative for adenopathy.    HISTORY: Past Medical, Surgical, Social, and Family History Reviewed & Updated per EMR.   Pertinent Historical Findings include:  Past Medical History:  Diagnosis Date  . Diabetes mellitus without complication ([place])   . Hypertension   . Obesity   . Sleep apnea     Past Surgical History:  Procedure Laterality Date  . acl repair     Right  . COLONOSCOPY WITH PROPOFOL  11/06/2012   Procedure: COLONOSCOPY WITH PROPOFOL;  Surgeon: Jerene Bears, MD;  Location: [facility];  Service: Gastroenterology;  Laterality: N/A;  Andrea/    Allergies  Allergen Reactions  . Benazepril Hcl Cough  . Tramadol  Other (See Comments)    dizziness    Family History  Problem Relation Age of Onset  . Arthritis Mother   . Hypertension Mother   . Breast cancer Mother   . Diabetes Mother   . Aneurysm Father        aortic  . Prostate cancer Father   . Hypertension Father   . Diabetes Father   . Hypertension Sister   . Rheum arthritis Sister   . Hypertension Brother      Social History   Socioeconomic History  . Marital status: Divorced    Spouse name: Not on file  . Number of children: 1  . Years of education: Not on file  . Highest education level: Not on file  Social Needs  . Financial resource strain: Not on file  . Food insecurity - worry: Not on file  . Food insecurity - inability: Not on file  . Transportation needs - medical: Not on file  . Transportation needs - non-medical: Not on file  Occupational History  . Occupation: Personal assistant: [employer]  Tobacco Use  . Smoking status: Never Smoker  . Smokeless tobacco: Never Used  Substance and Sexual Activity  . Alcohol use: No  . Drug use: No  . Sexual activity: Yes  Other Topics Concern  . 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  He has one daughter. Lives in one story home.    Highest level of education: high school   Regular exercise-no     PHYSICAL EXAM:  VS: BP 136/78 (BP Location: Left Arm, Patient Position: Sitting, Cuff Size: Normal)   Pulse 77   Temp 98.6 F (37 C) (Oral)   Ht 5' 10"  (1.778 m)   Wt (!) 419 lb (190.1 kg)   SpO2 98%   BMI 60.12 kg/m  Physical Exam Gen: NAD, alert, cooperative with exam, well-appearing ENT: normal lips, normal nasal mucosa,  Eye: normal EOM, normal conjunctiva and lids CV:  no edema, +2 pedal pulses   Resp: no accessory muscle use, non-labored,  Skin: no rashes, no areas of induration  Neuro: normal tone, normal sensation to touch Psych:   normal insight, alert and oriented MSK:  Left heel:  Pain mostly on the left medial border of the heel.  No significant pain on the medical plantar heel to palpation.  Pes planus and significant loss of the transverse arch.  No abnormal callus or ulcer formation.  Neurovascularly intact.     Aspiration/Injection Procedure Note Joseph Wood 12-May-1971  Procedure: Injection Indications: left heel pain   Procedure Details Consent: Risks of procedure as well as the alternatives and risks of each were explained to the (patient/caregiver).  Consent for procedure obtained. Time Out: Verified patient identification, verified procedure, site/side was marked, verified correct patient position, special equipment/implants available, medications/allergies/relevent history reviewed, required imaging and test results available.  Performed.  The area was cleaned with iodine and alcohol swabs.    The left plantar fascia was injected using 1 cc's of 40 Depomedrol and 1 cc's of 1% lidocaine without epi with a 25 1 1/2" needle.  Ultrasound was used.   A sterile dressing was applied.  Patient did tolerate procedure well.    ASSESSMENT & PLAN:   Plantar fasciitis of left foot Will try an injection today. If no improvement then would consider more fat pad syndrome.  - injection today  - mobic filled  - If no improvement could try custom orthotics.

## 2017-10-27 ENCOUNTER — Telehealth: Payer: Self-pay | Admitting: Internal Medicine

## 2017-10-27 NOTE — Assessment & Plan Note (Signed)
Will try an injection today. If no improvement then would consider more fat pad syndrome.  - injection today  - mobic filled  - If no improvement could try custom orthotics.

## 2017-10-27 NOTE — Telephone Encounter (Signed)
Pt called in and said that he is already taking meloxicam (MOBIC) 15 MG tablet [[id number]] .  He is wanting to know if there is anything else he can take other than Meloxicam?

## 2017-10-27 NOTE — Telephone Encounter (Signed)
Is this related to his visit yesterday with Ysidro Evert?

## 2017-10-28 NOTE — Telephone Encounter (Signed)
Treating patient for fat pad syndrome vs plantar fasciitis. Will use mobic and tylenol. Avoiding pain medications as they don't seem to help.    Rosemarie Ax, MD [facility] 10/28/2017, 3:11 PM

## 2017-10-31 NOTE — Telephone Encounter (Signed)
Pt called back. Informed of Dr. Raeford Razor recommendation. Pt stated understanding.

## 2017-10-31 NOTE — Telephone Encounter (Signed)
Tried to call patient. No vm to leave a message.

## 2017-11-16 ENCOUNTER — Other Ambulatory Visit: Payer: Self-pay | Admitting: Internal Medicine

## 2017-11-16 DIAGNOSIS — K219 Gastro-esophageal reflux disease without esophagitis: Secondary | ICD-10-CM

## 2018-01-13 DIAGNOSIS — Z6841 Body Mass Index (BMI) 40.0 and over, adult: Secondary | ICD-10-CM | POA: Diagnosis not present

## 2018-01-18 ENCOUNTER — Other Ambulatory Visit (HOSPITAL_COMMUNITY): Payer: Self-pay | Admitting: General Surgery

## 2018-01-23 ENCOUNTER — Ambulatory Visit (HOSPITAL_COMMUNITY)
Admission: RE | Admit: 2018-01-23 | Discharge: 2018-01-23 | Disposition: A | Discharge status: Home / Self Care | Payer: BLUE CROSS/BLUE SHIELD | Source: Ambulatory Visit | Attending: General Surgery | Admitting: General Surgery

## 2018-01-23 ENCOUNTER — Other Ambulatory Visit: Payer: Self-pay

## 2018-01-23 DIAGNOSIS — M1990 Unspecified osteoarthritis, unspecified site: Secondary | ICD-10-CM | POA: Insufficient documentation

## 2018-01-23 DIAGNOSIS — Z8042 Family history of malignant neoplasm of prostate: Secondary | ICD-10-CM | POA: Diagnosis not present

## 2018-01-23 DIAGNOSIS — I10 Essential (primary) hypertension: Secondary | ICD-10-CM | POA: Insufficient documentation

## 2018-01-23 DIAGNOSIS — Z6841 Body Mass Index (BMI) 40.0 and over, adult: Secondary | ICD-10-CM | POA: Insufficient documentation

## 2018-01-23 DIAGNOSIS — K219 Gastro-esophageal reflux disease without esophagitis: Secondary | ICD-10-CM | POA: Insufficient documentation

## 2018-01-23 DIAGNOSIS — Z79899 Other long term (current) drug therapy: Secondary | ICD-10-CM | POA: Insufficient documentation

## 2018-01-23 DIAGNOSIS — E119 Type 2 diabetes mellitus without complications: Secondary | ICD-10-CM | POA: Diagnosis not present

## 2018-01-23 DIAGNOSIS — G4730 Sleep apnea, unspecified: Secondary | ICD-10-CM | POA: Diagnosis not present

## 2018-01-23 DIAGNOSIS — E7800 Pure hypercholesterolemia, unspecified: Secondary | ICD-10-CM | POA: Diagnosis not present

## 2018-01-23 DIAGNOSIS — Z833 Family history of diabetes mellitus: Secondary | ICD-10-CM | POA: Diagnosis not present

## 2018-01-23 DIAGNOSIS — Z803 Family history of malignant neoplasm of breast: Secondary | ICD-10-CM | POA: Insufficient documentation

## 2018-01-23 DIAGNOSIS — Z791 Long term (current) use of non-steroidal anti-inflammatories (NSAID): Secondary | ICD-10-CM | POA: Insufficient documentation

## 2018-01-23 DIAGNOSIS — Z823 Family history of stroke: Secondary | ICD-10-CM | POA: Insufficient documentation

## 2018-01-23 DIAGNOSIS — Z8249 Family history of ischemic heart disease and other diseases of the circulatory system: Secondary | ICD-10-CM | POA: Insufficient documentation

## 2018-01-23 DIAGNOSIS — Z01812 Encounter for preprocedural laboratory examination: Secondary | ICD-10-CM | POA: Diagnosis not present

## 2018-01-28 ENCOUNTER — Other Ambulatory Visit: Payer: Self-pay | Admitting: Internal Medicine

## 2018-01-28 DIAGNOSIS — E118 Type 2 diabetes mellitus with unspecified complications: Secondary | ICD-10-CM

## 2018-01-28 DIAGNOSIS — I10 Essential (primary) hypertension: Secondary | ICD-10-CM

## 2018-01-30 ENCOUNTER — Encounter: Payer: Self-pay | Admitting: Registered"

## 2018-01-30 ENCOUNTER — Encounter: Payer: BLUE CROSS/BLUE SHIELD | Attending: General Surgery | Admitting: Registered"

## 2018-01-30 DIAGNOSIS — Z6841 Body Mass Index (BMI) 40.0 and over, adult: Secondary | ICD-10-CM | POA: Diagnosis not present

## 2018-01-30 DIAGNOSIS — Z713 Dietary counseling and surveillance: Secondary | ICD-10-CM | POA: Insufficient documentation

## 2018-01-30 DIAGNOSIS — E119 Type 2 diabetes mellitus without complications: Secondary | ICD-10-CM

## 2018-01-30 NOTE — Progress Notes (Signed)
Pre-Op Assessment Visit:  Pre-Operative RYGB Surgery  Medical Nutrition Therapy:  Appt start time: 9:30  End time: 10:47  Patient was seen on 01/30/2018 for Pre-Operative Nutrition Assessment. Assessment and letter of approval faxed to [facility] Bariatric Surgery Program coordinator on 01/30/2018.   Pt expectation of surgery: to be healthy, able to get around and do more with family and daughter, improve foot pain/preventing foot surgery, fish like he used to  Pt expectation of Dietitian: guidance   Start weight at [facility]: 407.9 BMI: 61.12   Pt arrives with fiance. Pt's fiance can be overbearing. Pt states he has really bad heartburn. Pt states he was diagnosed with diabetes 3 years ago. Pt states his most A1c was 6.6. Pt states he has eliminated pasta, sodas, rice, and sweets. Pt states he enjoys Premier Protein chocolate and Atkins caramel flavors. Pt states he has to lose at 50 lbs prior to surgery; 50 lbs from 420 lbs. Referral and pt state surgeon wants his BMI to be closer to 55.   Per insurance, pt needs 2 SWL visits prior to surgery.    24 hr Dietary Recall: First Meal: premier protein shake Snack: fruit or boiled egg Second Meal: premier protein, fruit Snack: fruit or boiled egg Third Meal: 3 homemade tacos (beef, lettuce, tomatoes, cheese, hard shell) Snack: none Beverages: water  Encouraged to engage in 150 minutes of moderate physical activity including cardiovascular and weight baring weekly  Handouts given during visit include:  . Pre-Op Goals . Bariatric Surgery Protein Shakes . Vitamin and Mineral Recommendations  During the appointment today the following Pre-Op Goals were reviewed with the patient: . Track your food and beverage: MyFitness Pal or Baritastic App . Make healthy food choices . Begin to limit portion sizes . Limited concentrated sugars and fried foods . Keep fat/sugar in the single digits per serving on          food  labels . Practice CHEWING your food  (aim for 30 chews per bite or until applesauce consistency) . Practice not drinking 15 minutes before, during, and 30 minutes after each meal/snack . Avoid all carbonated beverages  . Avoid/limit caffeinated beverages  . Avoid all sugar-sweetened beverages . Avoid alcohol . Consume 3 meals per day; eat every 3-5 hours . Make a list of non-food related activities . Aim for 64-100 ounces of FLUID daily  . Aim for at least 60-80 grams of PROTEIN daily . Look for a liquid protein source that contain ?15 g protein and ?5 g carbohydrate  (ex: shakes, drinks, shots) . Physical activity is an important part of a healthy lifestyle so keep it moving!  Follow diet recommendations listed below Energy and Macronutrient Recommendations: Calories: 2000 Carbohydrate: 225 Protein: 150 Fat: 56  Demonstrated degree of understanding via:  Teach Back   Teaching Method Utilized:  Visual Auditory Hands on  Barriers to learning/adherence to lifestyle change: non identified  Patient to call the [facility] to enroll in Pre-Op and Post-Op Nutrition Education when surgery date is scheduled.

## 2018-03-09 ENCOUNTER — Ambulatory Visit (INDEPENDENT_AMBULATORY_CARE_PROVIDER_SITE_OTHER): Payer: BLUE CROSS/BLUE SHIELD | Admitting: Psychiatry

## 2018-03-09 DIAGNOSIS — F509 Eating disorder, unspecified: Secondary | ICD-10-CM

## 2018-03-21 ENCOUNTER — Ambulatory Visit (INDEPENDENT_AMBULATORY_CARE_PROVIDER_SITE_OTHER): Payer: BLUE CROSS/BLUE SHIELD | Admitting: Psychiatry

## 2018-03-21 DIAGNOSIS — F509 Eating disorder, unspecified: Secondary | ICD-10-CM

## 2018-04-06 ENCOUNTER — Ambulatory Visit: Payer: Self-pay | Admitting: General Surgery

## 2018-04-13 DIAGNOSIS — Z6841 Body Mass Index (BMI) 40.0 and over, adult: Secondary | ICD-10-CM | POA: Diagnosis not present

## 2018-04-17 ENCOUNTER — Encounter: Payer: BLUE CROSS/BLUE SHIELD | Attending: General Surgery | Admitting: Registered"

## 2018-04-17 ENCOUNTER — Telehealth: Payer: Self-pay | Admitting: Skilled Nursing Facility1

## 2018-04-17 DIAGNOSIS — Z713 Dietary counseling and surveillance: Secondary | ICD-10-CM | POA: Diagnosis not present

## 2018-04-17 DIAGNOSIS — E119 Type 2 diabetes mellitus without complications: Secondary | ICD-10-CM

## 2018-04-17 DIAGNOSIS — Z6841 Body Mass Index (BMI) 40.0 and over, adult: Secondary | ICD-10-CM | POA: Diagnosis not present

## 2018-04-17 NOTE — Telephone Encounter (Signed)
Pt had some questions about pre-op diet. Dietitian answered those questions.

## 2018-04-17 NOTE — Progress Notes (Signed)
  Pre-Operative Nutrition Class:  Appt start time: 8:15   End time:  9:15.  Patient was seen on 04/17/2018 for Pre-Operative Bariatric Surgery [facility].   Surgery date: 05/02/2018 Surgery type: RYGB Start weight at [facility]: 407.9 Weight today: 378.6   Samples given per MNT protocol. Patient educated on appropriate usage: Bariatric Advantage Multivitamin Lot # [id number] Exp: 06/2018  Bariatric Advantage Calcium Citrate Lot # [id number] Exp:08/20/2018  Bariatric Advantage Calcium Citrate Lot # [id number] Exp: 09/30/2018  Renee Pain Protein Shake Lot # [id number] Exp: 09/30/2018  The following the learning objectives were met by the patient during this course:  Identify Pre-Op Dietary Goals and will begin 2 weeks pre-operatively  Identify appropriate sources of fluids and proteins   State protein recommendations and appropriate sources pre and post-operatively  Identify Post-Operative Dietary Goals and will follow for 2 weeks post-operatively  Identify appropriate multivitamin and calcium sources  Describe the need for physical activity post-operatively and will follow MD recommendations  State when to call healthcare provider regarding medication questions or post-operative complications  Handouts given during class include:  Pre-Op Bariatric Surgery Diet Handout  Protein Shake Handout  Post-Op Bariatric Surgery Nutrition Handout  [facility] Program Information Flyer  Support Group Information Flyer  [facility] Bariatric Supplements Price List  Follow-Up Plan: Patient will follow-up at [facility] 2 weeks post operatively for diet advancement per MD.

## 2018-04-21 ENCOUNTER — Encounter (HOSPITAL_COMMUNITY): Payer: Self-pay

## 2018-04-21 NOTE — Patient Instructions (Addendum)
Plummer Matich Diluzio  04/21/2018   Your procedure is scheduled on:  05-02-2018  Report to [facility] Main  Entrance  Report to admitting at  5:30 AM    Call this number if you have problems the morning of surgery [phone]               Bring CPAP mask and tubing day of surgery.   Remember: Do not eat solid food After 6:00PM day before surgery.  Then clear liquids diet from 6:00 PM until 4:30 AM day of surgery with drink. (see below instructions)     Take these medicines the morning of surgery with A SIP OF WATER:  Omeprazole, Lipitor              DO NOT TAKE ANY DIABETIC MEDICATIONS DAY OF YOUR SURGERY                               You may not have any metal on your body including hair pins and              piercings  Do not wear jewelry, make-up, lotions, powders or perfumes, deodorant                         Men may shave face and neck.   Do not bring valuables to the hospital. [place].  Contacts, dentures or bridgework may not be worn into surgery.  Leave suitcase in the car. After surgery it may be brought to your room.   Special Instructions:  Cough/ Deep Breathing and Leg exercises              Please read over the following fact sheets you were given: _____________________________________________________________________ MORNING OF SURGERY DRINK:  [place], DRINK ALL OF THE SHAKE AT ONE TIME.   NO SOLID FOOD AFTER 600 PM THE NIGHT BEFORE YOUR SURGERY. YOU MAY DRINK CLEAR FLUIDS. THE SHAKE YOU DRINK 3 HOURS PRIOR TO SURGERY AT 4:30 AM.  FINSIH DRINK BY 4:30 AM THEN THIS  WILL BE THE LAST FLUIDS YOU DRINK BEFORE SURGERY.   PAIN IS EXPECTED AFTER SURGERY AND WILL NOT BE COMPLETELY ELIMINATED. AMBULATION AND TYLENOL WILL HELP REDUCE INCISIONAL AND GAS PAIN. MOVEMENT IS KEY!  YOU ARE EXPECTED TO BE OUT OF BED WITHIN 4 HOURS OF ADMISSION TO YOUR PATIENT ROOM.  SITTING IN THE  RECLINER THROUGHOUT THE DAY IS IMPORTANT FOR DRINKING FLUIDS AND MOVING GAS THROUGHOUT THE GI TRACT.  COMPRESSION STOCKINGS SHOULD BE WORN [place] UNLESS YOU ARE WALKING.   INCENTIVE SPIROMETER SHOULD BE USED EVERY HOUR WHILE AWAKE TO DECREASE POST-OPERATIVE COMPLICATIONS SUCH AS PNEUMONIA.  WHEN DISCHARGED HOME, IT IS IMPORTANT TO CONTINUE TO WALK EVERY HOUR AND USE THE INCENTIVE SPIROMETER EVERY HOUR.       CLEAR LIQUID DIET   Foods Allowed  Foods Excluded  Coffee and tea, regular and decaf                             liquids that you cannot  Plain Jell-O in any flavor                                             see through such as: Fruit ices (not with fruit pulp)                                     milk, soups, orange juice  Iced Popsicles                                    All solid food Carbonated beverages, regular and diet                                    Cranberry, grape and apple juices Sports drinks like Gatorade Lightly seasoned clear broth or consume(fat free) Sugar, honey syrup  Sample Menu Breakfast                                Lunch                                     Supper Cranberry juice                    Beef broth                            Chicken broth Jell-O                                     Grape juice                           Apple juice Coffee or tea                        Jell-O                                      Popsicle                                                Coffee or tea                        Coffee or tea  _____________________________________________________________________                  [facility] - Preparing for Surgery Before  surgery, you can play an important role.  Because skin is not sterile, your skin needs to be as free of germs as possible.  You can reduce the number of germs on your skin by washing with CHG  (chlorahexidine gluconate) soap before surgery.  CHG is an antiseptic cleaner which kills germs and bonds with the skin to continue killing germs even after washing. Please DO NOT use if you have an allergy to CHG or antibacterial soaps.  If your skin becomes reddened/irritated stop using the CHG and inform your nurse when you arrive at Short Stay. Do not shave (including legs and underarms) for at least 48 hours prior to the first CHG shower.  You may shave your face/neck. Please follow these instructions carefully:  1.  Shower with CHG Soap the night before surgery and the  morning of Surgery.  2.  If you choose to wash your hair, wash your hair first as usual with your  normal  shampoo.  3.  After you shampoo, rinse your hair and body thoroughly to remove the  shampoo.                           4.  Use CHG as you would any other liquid soap.  You can apply chg directly  to the skin and wash                       Gently with a scrungie or clean washcloth.  5.  Apply the CHG Soap to your body ONLY FROM THE NECK DOWN.   Do not use on face/ open                           Wound or open sores. Avoid contact with eyes, ears mouth and genitals (private parts).                       Wash face,  Genitals (private parts) with your normal soap.             6.  Wash thoroughly, paying special attention to the area where your surgery  will be performed.  7.  Thoroughly rinse your body with warm water from the neck down.  8.  DO NOT shower/wash with your normal soap after using and rinsing off  the CHG Soap.                9.  Pat yourself dry with a clean towel.            10.  Wear clean pajamas.            11.  Place clean sheets on your bed the night of your first shower and do not  sleep with pets. Day of Surgery : Do not apply any lotions/deodorants the morning of surgery.  Please wear clean clothes to the hospital/surgery center.  FAILURE TO FOLLOW THESE INSTRUCTIONS MAY RESULT IN THE CANCELLATION OF  YOUR SURGERY PATIENT SIGNATURE_________________________________  NURSE SIGNATURE__________________________________  ________________________________________________________________________   Adam Phenix  An incentive spirometer is a tool that can help keep your lungs clear and active. This tool measures how well you are filling your lungs with each breath. Taking long deep breaths may help reverse or decrease the chance of developing breathing (pulmonary) problems (especially infection) following:  A long period of time when you are unable to move  or be active. BEFORE THE PROCEDURE   If the spirometer includes an indicator to show your best effort, your nurse or respiratory therapist will set it to a desired goal.  If possible, sit up straight or lean slightly forward. Try not to slouch.  Hold the incentive spirometer in an upright position. INSTRUCTIONS FOR USE  1. Sit on the edge of your bed if possible, or sit up as far as you can in bed or on a chair. 2. Hold the incentive spirometer in an upright position. 3. Breathe out normally. 4. Place the mouthpiece in your mouth and seal your lips tightly around it. 5. Breathe in slowly and as deeply as possible, raising the piston or the ball toward the top of the column. 6. Hold your breath for 3-5 seconds or for as long as possible. Allow the piston or ball to fall to the bottom of the column. 7. Remove the mouthpiece from your mouth and breathe out normally. 8. Rest for a few seconds and repeat Steps 1 through 7 at least 10 times every 1-2 hours when you are awake. Take your time and take a few normal breaths between deep breaths. 9. The spirometer may include an indicator to show your best effort. Use the indicator as a goal to work toward during each repetition. 10. After each set of 10 deep breaths, practice coughing to be sure your lungs are clear. If you have an incision (the cut made at the time of surgery), support your  incision when coughing by placing a pillow or rolled up towels firmly against it. Once you are able to get out of bed, walk around indoors and cough well. You may stop using the incentive spirometer when instructed by your caregiver.  RISKS AND COMPLICATIONS  Take your time so you do not get dizzy or light-headed.  If you are in pain, you may need to take or ask for pain medication before doing incentive spirometry. It is harder to take a deep breath if you are having pain. AFTER USE  Rest and breathe slowly and easily.  It can be helpful to keep track of a log of your progress. Your caregiver can provide you with a simple table to help with this. If you are using the spirometer at home, follow these instructions: [place] IF:   You are having difficultly using the spirometer.  You have trouble using the spirometer as often as instructed.  Your pain medication is not giving enough relief while using the spirometer.  You develop fever of 100.5 F (38.1 C) or higher. SEEK IMMEDIATE MEDICAL CARE IF:   You cough up bloody sputum that had not been present before.  You develop fever of 102 F (38.9 C) or greater.  You develop worsening pain at or near the incision site. MAKE SURE YOU:   Understand these instructions.  Will watch your condition.  Will get help right away if you are not doing well or get worse. Document Released: 04/11/2007 Document Revised: 02/21/2012 Document Reviewed: 06/12/2007 ExitCare Patient Information 2014 ExitCare, [place].   ________________________________________________________________________  WHAT IS A BLOOD TRANSFUSION? Blood Transfusion Information  A transfusion is the replacement of blood or some of its parts. Blood is made up of multiple cells which provide different functions.  Red blood cells carry oxygen and are used for blood loss replacement.  White blood cells fight against infection.  Platelets control bleeding.  Plasma  helps clot blood.  Other blood products are available for  specialized needs, such as hemophilia or other clotting disorders. BEFORE THE TRANSFUSION  Who gives blood for transfusions?   Healthy volunteers who are fully evaluated to make sure their blood is safe. This is blood bank blood. Transfusion therapy is the safest it has ever been in the practice of medicine. Before blood is taken from a donor, a complete history is taken to make sure that person has no history of diseases nor engages in risky social behavior (examples are intravenous drug use or sexual activity with multiple partners). The donor's travel history is screened to minimize risk of transmitting infections, such as malaria. The donated blood is tested for signs of infectious diseases, such as HIV and hepatitis. The blood is then tested to be sure it is compatible with you in order to minimize the chance of a transfusion reaction. If you or a relative donates blood, this is often done in anticipation of surgery and is not appropriate for emergency situations. It takes many days to process the donated blood. RISKS AND COMPLICATIONS Although transfusion therapy is very safe and saves many lives, the main dangers of transfusion include:   Getting an infectious disease.  Developing a transfusion reaction. This is an allergic reaction to something in the blood you were given. Every precaution is taken to prevent this. The decision to have a blood transfusion has been considered carefully by your caregiver before blood is given. Blood is not given unless the benefits outweigh the risks. AFTER THE TRANSFUSION  Right after receiving a blood transfusion, you will usually feel much better and more energetic. This is especially true if your red blood cells have gotten low (anemic). The transfusion raises the level of the red blood cells which carry oxygen, and this usually causes an energy increase.  The nurse administering the transfusion  will monitor you carefully for complications. HOME CARE INSTRUCTIONS  No special instructions are needed after a transfusion. You may find your energy is better. Speak with your caregiver about any limitations on activity for underlying diseases you may have. SEEK MEDICAL CARE IF:   Your condition is not improving after your transfusion.  You develop redness or irritation at the intravenous (IV) site. SEEK IMMEDIATE MEDICAL CARE IF:  Any of the following symptoms occur over the next 12 hours:  Shaking chills.  You have a temperature by mouth above 102 F (38.9 C), not controlled by medicine.  Chest, back, or muscle pain.  People around you feel you are not acting correctly or are confused.  Shortness of breath or difficulty breathing.  Dizziness and fainting.  You get a rash or develop hives.  You have a decrease in urine output.  Your urine turns a dark color or changes to pink, red, or brown. Any of the following symptoms occur over the next 10 days:  You have a temperature by mouth above 102 F (38.9 C), not controlled by medicine.  Shortness of breath.  Weakness after normal activity.  The white part of the eye turns yellow (jaundice).  You have a decrease in the amount of urine or are urinating less often.  Your urine turns a dark color or changes to pink, red, or brown. Document Released: 11/26/2000 Document Revised: 02/21/2012 Document Reviewed: 07/15/2008 [facility] Patient Information 2014 [place], [place].  _______________________________________________________________________

## 2018-04-24 ENCOUNTER — Other Ambulatory Visit: Payer: Self-pay

## 2018-04-24 ENCOUNTER — Encounter (HOSPITAL_COMMUNITY)
Admission: RE | Admit: 2018-04-24 | Discharge: 2018-04-24 | Disposition: A | Discharge status: Home / Self Care | Payer: BLUE CROSS/BLUE SHIELD | Source: Ambulatory Visit | Attending: General Surgery | Admitting: General Surgery

## 2018-04-24 ENCOUNTER — Encounter (HOSPITAL_COMMUNITY): Payer: Self-pay

## 2018-04-24 DIAGNOSIS — Z01812 Encounter for preprocedural laboratory examination: Secondary | ICD-10-CM | POA: Insufficient documentation

## 2018-04-24 HISTORY — DX: Obstructive sleep apnea (adult) (pediatric): G47.33

## 2018-04-24 HISTORY — DX: Presence of dental prosthetic device (complete) (partial): Z97.2

## 2018-04-24 HISTORY — DX: Type 2 diabetes mellitus without complications: E11.9

## 2018-04-24 HISTORY — DX: Unspecified osteoarthritis, unspecified site: M19.90

## 2018-04-24 HISTORY — DX: Gastro-esophageal reflux disease without esophagitis: K21.9

## 2018-04-24 HISTORY — DX: Obstructive sleep apnea (adult) (pediatric): Z99.89

## 2018-04-24 LAB — HEMOGLOBIN A1C
Hgb A1c MFr Bld: 6.1 % — ABNORMAL HIGH (ref 4.8–5.6)
Mean Plasma Glucose: 128.37 mg/dL

## 2018-04-24 LAB — CBC WITH DIFFERENTIAL/PLATELET
BASOS PCT: 0 %
Basophils Absolute: 0 10*3/uL (ref 0.0–0.1)
Eosinophils Absolute: 0.2 10*3/uL (ref 0.0–0.7)
Eosinophils Relative: 2 %
HEMATOCRIT: 46.5 % (ref 39.0–52.0)
HEMOGLOBIN: 15.2 g/dL (ref 13.0–17.0)
LYMPHS PCT: 26 %
Lymphs Abs: 1.9 10*3/uL (ref 0.7–4.0)
MCH: 29.5 pg (ref 26.0–34.0)
MCHC: 32.7 g/dL (ref 30.0–36.0)
MCV: 90.3 fL (ref 78.0–100.0)
MONO ABS: 0.6 10*3/uL (ref 0.1–1.0)
Monocytes Relative: 9 %
NEUTROS ABS: 4.6 10*3/uL (ref 1.7–7.7)
NEUTROS PCT: 63 %
Platelets: 249 10*3/uL (ref 150–400)
RBC: 5.15 MIL/uL (ref 4.22–5.81)
RDW: 14 % (ref 11.5–15.5)
WBC: 7.4 10*3/uL (ref 4.0–10.5)

## 2018-04-24 LAB — COMPREHENSIVE METABOLIC PANEL
ALBUMIN: 4.2 g/dL (ref 3.5–5.0)
ALK PHOS: 56 U/L (ref 38–126)
ALT: 27 U/L (ref 17–63)
AST: 23 U/L (ref 15–41)
Anion gap: 9 (ref 5–15)
BUN: 19 mg/dL (ref 6–20)
CO2: 25 mmol/L (ref 22–32)
CREATININE: 1.04 mg/dL (ref 0.61–1.24)
Calcium: 9.3 mg/dL (ref 8.9–10.3)
Chloride: 104 mmol/L (ref 101–111)
GFR calc Af Amer: >60 mL/min (ref >60)
GLUCOSE: 106 mg/dL — AB (ref 65–99)
POTASSIUM: 4.6 mmol/L (ref 3.5–5.1)
Sodium: 138 mmol/L (ref 135–145)
Total Bilirubin: 1 mg/dL (ref 0.3–1.2)
Total Protein: 7.4 g/dL (ref 6.5–8.1)

## 2018-04-24 LAB — ABO/RH: ABO/RH(D): O POS

## 2018-04-24 LAB — GLUCOSE, CAPILLARY: GLUCOSE-CAPILLARY: 94 mg/dL (ref 65–99)

## 2018-04-24 MED ORDER — CHLORHEXIDINE GLUCONATE CLOTH 2 % EX PADS
6.0000 | MEDICATED_PAD | Freq: Once | CUTANEOUS | Status: DC
  Filled 2018-04-24: qty 6

## 2018-04-24 NOTE — Progress Notes (Addendum)
EKG and CXR dated 01-23-2018 in epic. Unable to reach portable equipment via phone , per recording call not be completed as dialed.  Paged portable equipment deparment to request bari bed for pt dos. No return call.  ADDENDUM:  Reached portable equipment via phone, requested bari bed.

## 2018-04-27 ENCOUNTER — Other Ambulatory Visit: Payer: Self-pay | Admitting: Internal Medicine

## 2018-04-27 ENCOUNTER — Telehealth: Payer: Self-pay | Admitting: Internal Medicine

## 2018-04-27 DIAGNOSIS — E118 Type 2 diabetes mellitus with unspecified complications: Secondary | ICD-10-CM

## 2018-04-27 DIAGNOSIS — E785 Hyperlipidemia, unspecified: Secondary | ICD-10-CM

## 2018-04-27 MED ORDER — ATORVASTATIN CALCIUM 10 MG PO TABS: 10.0000 mg | ORAL_TABLET | Freq: Every day | ORAL | 0 refills | Status: DC

## 2018-04-27 NOTE — Addendum Note (Signed)
Addended by: Addison Naegeli on: 04/27/2018 10:23 AM   Modules accepted: Orders

## 2018-04-27 NOTE — Telephone Encounter (Signed)
Copied from [place] [id number]. Topic: Quick Communication - Rx Refill/Question >> Apr 27, 2018  9:42 AM Yvette Rack wrote: Medication: atorvastatin (LIPITOR) 10 MG tablet Has the patient contacted their pharmacy? Yes.   (Agent: If no, request that the patient contact the pharmacy for the refill.) Preferred Pharmacy (with phone number or street name):   [facility], [place] [phone] (Phone) [phone] (Fax)     * Pt states that he has operation for weight loss on Tuesday and need to take this medicine for the surgery   Agent: Please be advised that RX refills may take up to 3 business days. We ask that you follow-up with your pharmacy.

## 2018-04-27 NOTE — Telephone Encounter (Signed)
Copied from [place] [id number]. Topic: Quick Communication - Rx Refill/Question >> Apr 27, 2018  9:42 AM Yvette Rack wrote: Medication: atorvastatin (LIPITOR) 10 MG tablet Has the patient contacted their pharmacy? Yes.   (Agent: If no, request that the patient contact the pharmacy for the refill.) Preferred Pharmacy (with phone number or street name):   [place], [place] [phone] (Phone) [phone] (Fax)  *****Pt states that he has operation for weight loss on Tuesday and need to take this medicine for the surgery   Agent: Please be advised that RX refills may take up to 3 business days. We ask that you follow-up with your pharmacy.

## 2018-05-01 ENCOUNTER — Encounter (HOSPITAL_COMMUNITY): Payer: Self-pay | Admitting: Certified Registered Nurse Anesthetist

## 2018-05-01 NOTE — H&P (Signed)
History of Present Illness Marland Kitchen T. Pellegrino Kennard MD; 04/13/2018 9:36 AM) The patient is a 47 year old male who presents with obesity. Patient returns for his preoperative visit prior to planned laparoscopic Roux-en-Y gastric bypass. Patient was referred by Dr Scarlette Calico for consideration for surgical treatment for morbid obesity. The patient gives a history of progressive obesity since adolescence despite multiple attempts at medical management. Obesity has been affecting the patient in a number of ways including increasing mobility problems and difficulties with his tasks at work due to arthritis in his knees and foot problems. Significant co-morbid illnesses have developed including obstructive sleep apnea, diabetes, hypertension, elevated cholesterol and significant GERD. The patient is concerned about his health going forward at his current weight. He has tried to lose weight by increasing exercise and cutting out foods a number of times without much success.. At his initial visit we elected Roux-en-Y gastric bypass due to his significant GERD.  He successfully completed his preoperative workup. No concerns of psychologic and nutrition evaluation. Ultrasound was technically inadequate and did show some steatohepatitis. Lab work was unremarkable. H. pylori was negative.  He has been working hard on his preoperative diet and has lost almost 35 pounds, BMI is reduced from 61-56. He has had no significant illnesses or new diagnoses since his original presentation.   Problem List/Past Medical Marland Kitchen T. Koy Lamp, MD; 04/13/2018 9:36 AM) MORBID OBESITY WITH BMI OF 60.0-69.9, ADULT (E66.01)   Past Surgical History Marland Kitchen T. Marai Teehan, MD; 04/13/2018 9:36 AM) Knee Surgery  Right.  Diagnostic Studies History Marland Kitchen T. Ryann Pauli, MD; 04/13/2018 9:36 AM) Colonoscopy  1-5 years ago  Allergies Marland Kitchen T. Coraline Talwar, MD; 04/13/2018 9:36 AM) No Known Drug Allergies [04/13/2018]: Allergies  Reconciled  No Known Allergies [01/13/2018]:  Medication History Alean Rinne, RMA; 04/13/2018 9:21 AM) Atorvastatin Calcium (10MG Tablet, Oral) Active. Omeprazole (20MG Capsule DR, Oral) Active. Telmisartan (80MG Tablet, Oral) Active. Meloxicam (15MG Tablet, Oral) Active. Lancets Active. One Touch Test Strips (In Vitro) Active. Lyrica (75MG Capsule, Oral) Active. Paxil (Oral) Specific strength unknown - Active. Telmisartan (Oral) Specific strength unknown - Active. MetFORMIN HCl (500MG Tablet, Oral) Active. Medications Reconciled  Social History Marland Kitchen T. Jona Erkkila, MD; 04/13/2018 9:36 AM) Alcohol use  Remotely quit alcohol use. Caffeine use  Tea. No drug use   Family History Marland Kitchen T. Lilya Smitherman, MD; 04/13/2018 9:36 AM) Alcohol Abuse  Father. Arthritis  Father, Mother, Sister. Breast Cancer  Mother. Cerebrovascular Accident  Mother. Diabetes Mellitus  Father, Mother. Heart disease in male family member before age 41  Heart disease in male family member before age 58  Hypertension  Father, Mother, Sister. Prostate Cancer  Father.  Other Problems Marland Kitchen T. Greyden Besecker, MD; 04/13/2018 9:36 AM) Arthritis  Back Pain  Bladder Problems  Diabetes Mellitus  Gastroesophageal Reflux Disease  High blood pressure  Hypercholesterolemia  Sleep Apnea   Vitals Mardene Celeste King RMA; 04/13/2018 9:21 AM) 04/13/2018 9:20 AM Weight: 384.4 lb Height: 69.5in Body Surface Area: 2.74 m Body Mass Index: 55.95 kg/m  Temp.: 98.8F  Pulse: 96 (Regular)  BP: 150/85 (Sitting, Left Arm, Standard)       Physical Exam Marland Kitchen T. Mahdi Frye MD; 04/13/2018 9:36 AM) The physical exam findings are as follows: Note:General: Alert, morbidly obese male, in no distress Skin: Warm and dry without rash or infection. HEENT: No palpable masses or thyromegaly. Sclera nonicteric. Pupils equal round and reactive. Lymph nodes: No cervical, supraclavicular, or inguinal  nodes palpable. Lungs: Breath sounds clear and equal. No wheezing or increased work of  breathing. Cardiovascular: Regular rate and rhythm without murmer. No JVD or edema. Peripheral pulses intact. Abdomen: Nondistended. Soft and nontender. No masses palpable. No organomegaly. No palpable hernias. Extremities: No edema or joint swelling or deformity. No chronic venous stasis changes. Neurologic: Alert and fully oriented. Gait normal. No focal weakness. Psychiatric: Normal mood and affect. Thought content appropriate with normal judgement and insight    Assessment & Plan Marland Kitchen T. Kentravious Lipford MD; 04/13/2018 9:38 AM) MORBID OBESITY WITH BMI OF 60.0-69.9, ADULT (E66.01) Impression: Patient with progressive morbid obesity unresponsive to multiple efforts at nonsurgical weight loss who presents with a BMI of 60 and comorbidities of chronic joint and foot pain and DJD, adult-onset diabetes mellitus, hyperlipidemia, hypertension and significant GERD requiring prescription medications. I believe there would be very significant medical benefit from surgical weight loss. I think due to his severe GERD he would not be a good candidate for sleeve. After our discussion of surgical options currently available the patient has decided to proceed with laparoscopic Roux-en-Y gastric bypass due to the reasons above. We have discussed the nature of the problem and the risks of remaining morbidly obese. We discussed laparoscopic Roux-en-Y gastric bypass in detail including the nature of the procedure, expected hospitalization and recovery, and major risks of anesthetic complications, bleeding, blood clots and pulmonary emboli, leakage and infection and long-term risks of stricture, ulceration, bowel obstruction, nutritional deficiencies, and failure to lose weight or weight regain. We discussed these problems could lead to death. The patient was given a complete consent form to review and all questions were answered. He is  doing well with his preoperative diet and we will continue this and certainly should be below I requested BMI 55 by the time of surgery. He is given prescriptions for nausea medication and pain medication and is already on omeprazole which he will continue.

## 2018-05-02 ENCOUNTER — Encounter (HOSPITAL_COMMUNITY): Payer: Self-pay

## 2018-05-02 ENCOUNTER — Inpatient Hospital Stay (HOSPITAL_COMMUNITY)
Admission: RE | Admit: 2018-05-02 | Discharge: 2018-05-05 | DRG: 620 | Disposition: A | Discharge status: Home / Self Care | Payer: BLUE CROSS/BLUE SHIELD | Attending: General Surgery | Admitting: General Surgery

## 2018-05-02 ENCOUNTER — Encounter (HOSPITAL_COMMUNITY)
Admission: RE | Disposition: A | Discharge status: Home / Self Care | Payer: Self-pay | Source: Home / Self Care | Attending: General Surgery

## 2018-05-02 ENCOUNTER — Inpatient Hospital Stay (HOSPITAL_COMMUNITY): Payer: BLUE CROSS/BLUE SHIELD | Admitting: Certified Registered Nurse Anesthetist

## 2018-05-02 ENCOUNTER — Other Ambulatory Visit: Payer: Self-pay

## 2018-05-02 DIAGNOSIS — Z7984 Long term (current) use of oral hypoglycemic drugs: Secondary | ICD-10-CM

## 2018-05-02 DIAGNOSIS — Z9989 Dependence on other enabling machines and devices: Secondary | ICD-10-CM | POA: Diagnosis not present

## 2018-05-02 DIAGNOSIS — Z8261 Family history of arthritis: Secondary | ICD-10-CM

## 2018-05-02 DIAGNOSIS — M170 Bilateral primary osteoarthritis of knee: Secondary | ICD-10-CM | POA: Diagnosis present

## 2018-05-02 DIAGNOSIS — Z79899 Other long term (current) drug therapy: Secondary | ICD-10-CM | POA: Diagnosis not present

## 2018-05-02 DIAGNOSIS — E119 Type 2 diabetes mellitus without complications: Secondary | ICD-10-CM | POA: Diagnosis present

## 2018-05-02 DIAGNOSIS — Z87891 Personal history of nicotine dependence: Secondary | ICD-10-CM

## 2018-05-02 DIAGNOSIS — G8929 Other chronic pain: Secondary | ICD-10-CM | POA: Diagnosis not present

## 2018-05-02 DIAGNOSIS — Z885 Allergy status to narcotic agent status: Secondary | ICD-10-CM

## 2018-05-02 DIAGNOSIS — Y838 Other surgical procedures as the cause of abnormal reaction of the patient, or of later complication, without mention of misadventure at the time of the procedure: Secondary | ICD-10-CM | POA: Diagnosis not present

## 2018-05-02 DIAGNOSIS — Z8042 Family history of malignant neoplasm of prostate: Secondary | ICD-10-CM

## 2018-05-02 DIAGNOSIS — Z803 Family history of malignant neoplasm of breast: Secondary | ICD-10-CM

## 2018-05-02 DIAGNOSIS — Z833 Family history of diabetes mellitus: Secondary | ICD-10-CM | POA: Diagnosis not present

## 2018-05-02 DIAGNOSIS — K7581 Nonalcoholic steatohepatitis (NASH): Secondary | ICD-10-CM | POA: Diagnosis not present

## 2018-05-02 DIAGNOSIS — Z972 Presence of dental prosthetic device (complete) (partial): Secondary | ICD-10-CM | POA: Diagnosis not present

## 2018-05-02 DIAGNOSIS — M79673 Pain in unspecified foot: Secondary | ICD-10-CM | POA: Diagnosis not present

## 2018-05-02 DIAGNOSIS — Z6841 Body Mass Index (BMI) 40.0 and over, adult: Secondary | ICD-10-CM

## 2018-05-02 DIAGNOSIS — Y92230 Patient room in hospital as the place of occurrence of the external cause: Secondary | ICD-10-CM | POA: Diagnosis not present

## 2018-05-02 DIAGNOSIS — K921 Melena: Secondary | ICD-10-CM | POA: Diagnosis not present

## 2018-05-02 DIAGNOSIS — K219 Gastro-esophageal reflux disease without esophagitis: Secondary | ICD-10-CM | POA: Diagnosis not present

## 2018-05-02 DIAGNOSIS — I10 Essential (primary) hypertension: Secondary | ICD-10-CM | POA: Diagnosis present

## 2018-05-02 DIAGNOSIS — Z888 Allergy status to other drugs, medicaments and biological substances status: Secondary | ICD-10-CM

## 2018-05-02 DIAGNOSIS — G4733 Obstructive sleep apnea (adult) (pediatric): Secondary | ICD-10-CM | POA: Diagnosis not present

## 2018-05-02 DIAGNOSIS — Z823 Family history of stroke: Secondary | ICD-10-CM

## 2018-05-02 DIAGNOSIS — E785 Hyperlipidemia, unspecified: Secondary | ICD-10-CM | POA: Diagnosis not present

## 2018-05-02 DIAGNOSIS — K91840 Postprocedural hemorrhage and hematoma of a digestive system organ or structure following a digestive system procedure: Secondary | ICD-10-CM | POA: Diagnosis not present

## 2018-05-02 DIAGNOSIS — Z8249 Family history of ischemic heart disease and other diseases of the circulatory system: Secondary | ICD-10-CM

## 2018-05-02 HISTORY — PX: GASTRIC ROUX-EN-Y: SHX5262

## 2018-05-02 LAB — HEMOGLOBIN AND HEMATOCRIT, BLOOD
HEMATOCRIT: 43.7 % (ref 39.0–52.0)
Hemoglobin: 14.7 g/dL (ref 13.0–17.0)

## 2018-05-02 LAB — GLUCOSE, CAPILLARY
GLUCOSE-CAPILLARY: 156 mg/dL — AB (ref 65–99)
GLUCOSE-CAPILLARY: 123 mg/dL — AB (ref 65–99)
Glucose-Capillary: 162 mg/dL — ABNORMAL HIGH (ref 65–99)
Glucose-Capillary: 157 mg/dL — ABNORMAL HIGH (ref 65–99)
Glucose-Capillary: 100 mg/dL — ABNORMAL HIGH (ref 65–99)

## 2018-05-02 LAB — TYPE AND SCREEN
ABO/RH(D): O POS
Antibody Screen: NEGATIVE

## 2018-05-02 SURGERY — LAPAROSCOPIC ROUX-EN-Y GASTRIC BYPASS WITH UPPER ENDOSCOPY
Anesthesia: General | Site: Abdomen

## 2018-05-02 MED ORDER — ROCURONIUM BROMIDE 10 MG/ML (PF) SYRINGE
PREFILLED_SYRINGE | INTRAVENOUS | Status: DC | PRN
  Administered 2018-05-02 (×2): 20 mg via INTRAVENOUS
  Administered 2018-05-02: 10 mg via INTRAVENOUS
  Administered 2018-05-02: 20 mg via INTRAVENOUS
  Administered 2018-05-02: 70 mg via INTRAVENOUS
  Administered 2018-05-02: 10 mg via INTRAVENOUS

## 2018-05-02 MED ORDER — GABAPENTIN 300 MG PO CAPS
300.0000 mg | ORAL_CAPSULE | ORAL | Status: AC
  Administered 2018-05-02: 300 mg via ORAL
  Filled 2018-05-02: qty 1

## 2018-05-02 MED ORDER — SODIUM CHLORIDE 0.9 % IJ SOLN
INTRAMUSCULAR | Status: DC | PRN
  Administered 2018-05-02: 50 mL

## 2018-05-02 MED ORDER — ACETAMINOPHEN 160 MG/5ML PO SOLN
650.0000 mg | Freq: Four times a day (QID) | ORAL | Status: DC
  Administered 2018-05-02 – 2018-05-05 (×9): 650 mg via ORAL
  Filled 2018-05-02 (×11): qty 20.3

## 2018-05-02 MED ORDER — CELECOXIB 200 MG PO CAPS
400.0000 mg | ORAL_CAPSULE | ORAL | Status: AC
  Administered 2018-05-02: 400 mg via ORAL
  Filled 2018-05-02: qty 2

## 2018-05-02 MED ORDER — MIDAZOLAM HCL 2 MG/2ML IJ SOLN
INTRAMUSCULAR | Status: AC
  Filled 2018-05-02: qty 2

## 2018-05-02 MED ORDER — FENTANYL CITRATE (PF) 100 MCG/2ML IJ SOLN
INTRAMUSCULAR | Status: AC
  Filled 2018-05-02: qty 2

## 2018-05-02 MED ORDER — 0.9 % SODIUM CHLORIDE (POUR BTL) OPTIME
TOPICAL | Status: DC | PRN
  Administered 2018-05-02: 1000 mL

## 2018-05-02 MED ORDER — LACTATED RINGERS IV SOLN
INTRAVENOUS | Status: DC
  Administered 2018-05-02: 12:00:00 via INTRAVENOUS

## 2018-05-02 MED ORDER — ONDANSETRON HCL 4 MG/2ML IJ SOLN
INTRAMUSCULAR | Status: DC | PRN
  Administered 2018-05-02: 4 mg via INTRAVENOUS

## 2018-05-02 MED ORDER — ACETAMINOPHEN 500 MG PO TABS
1000.0000 mg | ORAL_TABLET | ORAL | Status: AC
  Administered 2018-05-02: 1000 mg via ORAL
  Filled 2018-05-02: qty 2

## 2018-05-02 MED ORDER — PROPOFOL 10 MG/ML IV BOLUS
INTRAVENOUS | Status: DC | PRN
  Administered 2018-05-02: 200 mg via INTRAVENOUS

## 2018-05-02 MED ORDER — DEXAMETHASONE SODIUM PHOSPHATE 10 MG/ML IJ SOLN
INTRAMUSCULAR | Status: AC
  Filled 2018-05-02: qty 1

## 2018-05-02 MED ORDER — BUPIVACAINE LIPOSOME 1.3 % IJ SUSP
20.0000 mL | Freq: Once | INTRAMUSCULAR | Status: AC
  Administered 2018-05-02: 20 mL
  Filled 2018-05-02: qty 20

## 2018-05-02 MED ORDER — BUPIVACAINE-EPINEPHRINE (PF) 0.25% -1:200000 IJ SOLN
INTRAMUSCULAR | Status: AC
  Filled 2018-05-02: qty 30

## 2018-05-02 MED ORDER — ROCURONIUM BROMIDE 10 MG/ML (PF) SYRINGE
PREFILLED_SYRINGE | INTRAVENOUS | Status: AC
  Filled 2018-05-02: qty 5

## 2018-05-02 MED ORDER — ENOXAPARIN SODIUM 30 MG/0.3ML ~~LOC~~ SOLN
30.0000 mg | Freq: Two times a day (BID) | SUBCUTANEOUS | Status: DC
  Administered 2018-05-02 – 2018-05-03 (×3): 30 mg via SUBCUTANEOUS
  Filled 2018-05-02 (×3): qty 0.3

## 2018-05-02 MED ORDER — FAMOTIDINE IN NACL 20-0.9 MG/50ML-% IV SOLN
20.0000 mg | Freq: Two times a day (BID) | INTRAVENOUS | Status: DC
  Administered 2018-05-02 – 2018-05-05 (×7): 20 mg via INTRAVENOUS
  Filled 2018-05-02 (×7): qty 50

## 2018-05-02 MED ORDER — ONDANSETRON HCL 4 MG/2ML IJ SOLN
INTRAMUSCULAR | Status: AC
  Filled 2018-05-02: qty 2

## 2018-05-02 MED ORDER — EPHEDRINE SULFATE-NACL 50-0.9 MG/10ML-% IV SOSY
PREFILLED_SYRINGE | INTRAVENOUS | Status: DC | PRN
  Administered 2018-05-02: 5 mg via INTRAVENOUS
  Administered 2018-05-02: 10 mg via INTRAVENOUS
  Administered 2018-05-02: 5 mg via INTRAVENOUS

## 2018-05-02 MED ORDER — DEXAMETHASONE SODIUM PHOSPHATE 4 MG/ML IJ SOLN
4.0000 mg | INTRAMUSCULAR | Status: AC
  Administered 2018-05-02: 5 mg via INTRAVENOUS

## 2018-05-02 MED ORDER — KETAMINE HCL 10 MG/ML IJ SOLN
INTRAMUSCULAR | Status: AC
  Filled 2018-05-02: qty 1

## 2018-05-02 MED ORDER — LACTATED RINGERS IV SOLN
INTRAVENOUS | Status: DC
  Administered 2018-05-03: 100 mL/h via INTRAVENOUS
  Administered 2018-05-04: 1000 mL via INTRAVENOUS
  Administered 2018-05-04: 11:00:00 via INTRAVENOUS

## 2018-05-02 MED ORDER — LIDOCAINE 2% (20 MG/ML) 5 ML SYRINGE
INTRAMUSCULAR | Status: AC
  Filled 2018-05-02: qty 5

## 2018-05-02 MED ORDER — FENTANYL CITRATE (PF) 100 MCG/2ML IJ SOLN
INTRAMUSCULAR | Status: DC | PRN
  Administered 2018-05-02 (×3): 50 ug via INTRAVENOUS
  Administered 2018-05-02: 100 ug via INTRAVENOUS
  Administered 2018-05-02: 50 ug via INTRAVENOUS

## 2018-05-02 MED ORDER — EVICEL 5 ML EX KIT
PACK | Freq: Once | CUTANEOUS | Status: AC
  Administered 2018-05-02: 1
  Filled 2018-05-02: qty 1

## 2018-05-02 MED ORDER — LIDOCAINE 20MG/ML (2%) 15 ML SYRINGE OPTIME
INTRAMUSCULAR | Status: DC | PRN
  Administered 2018-05-02: 1.5 mg/kg/h via INTRAVENOUS

## 2018-05-02 MED ORDER — FENTANYL CITRATE (PF) 100 MCG/2ML IJ SOLN: 25.0000 ug | INTRAMUSCULAR | Status: DC | PRN

## 2018-05-02 MED ORDER — SUGAMMADEX SODIUM 200 MG/2ML IV SOLN
INTRAVENOUS | Status: DC | PRN
  Administered 2018-05-02: 400 mg via INTRAVENOUS

## 2018-05-02 MED ORDER — CEFOTETAN DISODIUM-DEXTROSE 2-2.08 GM-%(50ML) IV SOLR
2.0000 g | INTRAVENOUS | Status: AC
  Administered 2018-05-02: 2 g via INTRAVENOUS
  Filled 2018-05-02: qty 50

## 2018-05-02 MED ORDER — OXYCODONE HCL 5 MG/5ML PO SOLN
5.0000 mg | ORAL | Status: DC | PRN
  Administered 2018-05-02 – 2018-05-03 (×2): 5 mg via ORAL
  Filled 2018-05-02: qty 5
  Filled 2018-05-02: qty 10

## 2018-05-02 MED ORDER — PHENYLEPHRINE 40 MCG/ML (10ML) SYRINGE FOR IV PUSH (FOR BLOOD PRESSURE SUPPORT)
PREFILLED_SYRINGE | INTRAVENOUS | Status: DC | PRN
  Administered 2018-05-02 (×2): 80 ug via INTRAVENOUS

## 2018-05-02 MED ORDER — LIDOCAINE 2% (20 MG/ML) 5 ML SYRINGE: INTRAMUSCULAR | Status: DC | PRN

## 2018-05-02 MED ORDER — ONDANSETRON HCL 4 MG/2ML IJ SOLN: 4.0000 mg | INTRAMUSCULAR | Status: DC | PRN

## 2018-05-02 MED ORDER — BUPIVACAINE-EPINEPHRINE 0.25% -1:200000 IJ SOLN
INTRAMUSCULAR | Status: DC | PRN
  Administered 2018-05-02: 30 mL

## 2018-05-02 MED ORDER — APREPITANT 40 MG PO CAPS
40.0000 mg | ORAL_CAPSULE | ORAL | Status: AC
  Administered 2018-05-02: 40 mg via ORAL
  Filled 2018-05-02: qty 1

## 2018-05-02 MED ORDER — SODIUM CHLORIDE 0.9 % IJ SOLN
INTRAMUSCULAR | Status: AC
  Filled 2018-05-02: qty 50

## 2018-05-02 MED ORDER — PROPOFOL 10 MG/ML IV BOLUS
INTRAVENOUS | Status: AC
  Filled 2018-05-02: qty 40

## 2018-05-02 MED ORDER — LIDOCAINE 2% (20 MG/ML) 5 ML SYRINGE
INTRAMUSCULAR | Status: DC | PRN
  Administered 2018-05-02: 80 mg via INTRAVENOUS

## 2018-05-02 MED ORDER — SUGAMMADEX SODIUM 500 MG/5ML IV SOLN
INTRAVENOUS | Status: AC
  Filled 2018-05-02: qty 5

## 2018-05-02 MED ORDER — PREMIER PROTEIN SHAKE
2.0000 [oz_av] | ORAL | Status: DC
  Administered 2018-05-03 – 2018-05-05 (×24): 2 [oz_av] via ORAL

## 2018-05-02 MED ORDER — MORPHINE SULFATE (PF) 2 MG/ML IV SOLN: 1.0000 mg | INTRAVENOUS | Status: DC | PRN

## 2018-05-02 MED ORDER — HEPARIN SODIUM (PORCINE) 5000 UNIT/ML IJ SOLN
5000.0000 [IU] | INTRAMUSCULAR | Status: AC
  Administered 2018-05-02: 5000 [IU] via SUBCUTANEOUS
  Filled 2018-05-02: qty 1

## 2018-05-02 MED ORDER — MIDAZOLAM HCL 5 MG/5ML IJ SOLN
INTRAMUSCULAR | Status: DC | PRN
  Administered 2018-05-02: 2 mg via INTRAVENOUS

## 2018-05-02 MED ORDER — LACTATED RINGERS IR SOLN
Status: DC | PRN
  Administered 2018-05-02: 1000 mL

## 2018-05-02 MED ORDER — MEPERIDINE HCL 50 MG/ML IJ SOLN: 6.2500 mg | INTRAMUSCULAR | Status: DC | PRN

## 2018-05-02 MED ORDER — METOCLOPRAMIDE HCL 5 MG/ML IJ SOLN: 10.0000 mg | Freq: Once | INTRAMUSCULAR | Status: DC | PRN

## 2018-05-02 MED ORDER — SUCCINYLCHOLINE CHLORIDE 200 MG/10ML IV SOSY
PREFILLED_SYRINGE | INTRAVENOUS | Status: DC | PRN
  Administered 2018-05-02: 180 mg via INTRAVENOUS

## 2018-05-02 MED ORDER — LACTATED RINGERS IV SOLN
INTRAVENOUS | Status: DC | PRN
  Administered 2018-05-02 (×2): via INTRAVENOUS

## 2018-05-02 MED ORDER — KETAMINE HCL 10 MG/ML IJ SOLN
INTRAMUSCULAR | Status: DC | PRN
  Administered 2018-05-02: 10 mg via INTRAVENOUS
  Administered 2018-05-02: 35 mg via INTRAVENOUS
  Administered 2018-05-02: 10 mg via INTRAVENOUS

## 2018-05-02 MED ORDER — SCOPOLAMINE 1 MG/3DAYS TD PT72
1.0000 | MEDICATED_PATCH | TRANSDERMAL | Status: DC
  Administered 2018-05-02: 1.5 mg via TRANSDERMAL
  Filled 2018-05-02: qty 1

## 2018-05-02 SURGICAL SUPPLY — 84 items
ADH SKN CLS APL DERMABOND .7 (GAUZE/BANDAGES/DRESSINGS) ×1
APPLIER CLIP ROT 10 11.4 M/L (STAPLE)
APPLIER CLIP ROT 13.4 12 LRG (CLIP)
APR CLP LRG 13.4X12 ROT 20 MLT (CLIP)
APR CLP MED LRG 11.4X10 (STAPLE)
BAG SPEC RTRVL LRG 6X4 10 (ENDOMECHANICALS)
BLADE SURG SZ11 CARB STEEL (BLADE) ×2 IMPLANT
CABLE HIGH FREQUENCY MONO STRZ (ELECTRODE) ×2 IMPLANT
CHLORAPREP W/TINT 26ML (MISCELLANEOUS) ×2 IMPLANT
CLIP APPLIE ROT 10 11.4 M/L (STAPLE) IMPLANT
CLIP APPLIE ROT 13.4 12 LRG (CLIP) IMPLANT
CLIP SUT LAPRA TY ABSORB (SUTURE) ×4 IMPLANT
CUTTER FLEX LINEAR 45M (STAPLE) ×2 IMPLANT
DECANTER SPIKE VIAL GLASS SM (MISCELLANEOUS) ×2 IMPLANT
DERMABOND ADVANCED (GAUZE/BANDAGES/DRESSINGS) ×1
DERMABOND ADVANCED .7 DNX12 (GAUZE/BANDAGES/DRESSINGS) ×1 IMPLANT
DEVICE SUT QUICK LOAD TK 5 (STAPLE) IMPLANT
DEVICE SUT TI-KNOT TK 5X26 (MISCELLANEOUS) IMPLANT
DEVICE SUTURE ENDOST 10MM (ENDOMECHANICALS) ×1 IMPLANT
DISSECTOR BLUNT TIP ENDO 5MM (MISCELLANEOUS) IMPLANT
DRAIN PENROSE 18X1/4 LTX STRL (WOUND CARE) ×2 IMPLANT
ELECT REM PT RETURN 15FT ADLT (MISCELLANEOUS) ×2 IMPLANT
GAUZE 4X4 16PLY RFD (DISPOSABLE) ×2 IMPLANT
GAUZE SPONGE 4X4 12PLY STRL (GAUZE/BANDAGES/DRESSINGS) ×1 IMPLANT
GLOVE BIOGEL PI IND STRL 7.5 (GLOVE) ×1 IMPLANT
GLOVE BIOGEL PI INDICATOR 7.5 (GLOVE) ×1
GLOVE ECLIPSE 7.5 STRL STRAW (GLOVE) ×2 IMPLANT
GOWN STRL REUS W/TWL XL LVL3 (GOWN DISPOSABLE) ×4 IMPLANT
HEMOSTAT SURGICEL 4X8 (HEMOSTASIS) IMPLANT
HOVERMATT SINGLE USE (MISCELLANEOUS) ×2 IMPLANT
KIT BASIN OR (CUSTOM PROCEDURE TRAY) ×2 IMPLANT
KIT GASTRIC LAVAGE 34FR ADT (SET/KITS/TRAYS/PACK) ×2 IMPLANT
LUBRICANT JELLY K Y 4OZ (MISCELLANEOUS) ×1 IMPLANT
MARKER SKIN DUAL TIP RULER LAB (MISCELLANEOUS) ×2 IMPLANT
NDL SPNL 22GX3.5 QUINCKE BK (NEEDLE) ×1 IMPLANT
NEEDLE SPNL 22GX3.5 QUINCKE BK (NEEDLE) ×2 IMPLANT
PACK CARDIOVASCULAR III (CUSTOM PROCEDURE TRAY) ×2 IMPLANT
POUCH SPECIMEN RETRIEVAL 10MM (ENDOMECHANICALS) IMPLANT
RELOAD 45 VASCULAR/THIN (ENDOMECHANICALS) ×4 IMPLANT
RELOAD ENDO STITCH 2.0 (ENDOMECHANICALS) ×22
RELOAD STAPLE 45 2.5 WHT GRN (ENDOMECHANICALS) ×1 IMPLANT
RELOAD STAPLE 45 3.5 BLU ETS (ENDOMECHANICALS) ×1 IMPLANT
RELOAD STAPLE 60 2.6 WHT THN (STAPLE) ×1 IMPLANT
RELOAD STAPLE 60 3.6 BLU REG (STAPLE) ×2 IMPLANT
RELOAD STAPLE 60 3.8 GOLD REG (STAPLE) ×1 IMPLANT
RELOAD STAPLE TA45 3.5 REG BLU (ENDOMECHANICALS) ×2 IMPLANT
RELOAD STAPLER BLUE 60MM (STAPLE) ×5 IMPLANT
RELOAD STAPLER GOLD 60MM (STAPLE) ×1 IMPLANT
RELOAD STAPLER WHITE 60MM (STAPLE) ×1 IMPLANT
RELOAD SUT SNGL STCH ABSRB 2-0 (ENDOMECHANICALS) ×5 IMPLANT
RELOAD SUT SNGL STCH BLK 2-0 (ENDOMECHANICALS) ×5 IMPLANT
SCISSORS LAP 5X45 EPIX DISP (ENDOMECHANICALS) ×2 IMPLANT
SET IRRIG TUBING LAPAROSCOPIC (IRRIGATION / IRRIGATOR) ×2 IMPLANT
SHEARS HARMONIC ACE PLUS 45CM (MISCELLANEOUS) ×2 IMPLANT
SLEEVE ADV FIXATION 12X100MM (TROCAR) ×5 IMPLANT
SOLUTION ANTI FOG 6CC (MISCELLANEOUS) ×2 IMPLANT
STAPLER ECHELON LONG 60 440 (INSTRUMENTS) ×1 IMPLANT
STAPLER RELOAD BLUE 60MM (STAPLE) ×10
STAPLER RELOAD GOLD 60MM (STAPLE) ×2
STAPLER RELOAD WHITE 60MM (STAPLE) ×2
SUT MNCRL AB 4-0 PS2 18 (SUTURE) ×2 IMPLANT
SUT RELOAD ENDO STITCH 2 48X1 (ENDOMECHANICALS) ×6
SUT RELOAD ENDO STITCH 2.0 (ENDOMECHANICALS) ×5
SUT SURGIDAC NAB ES-9 0 48 120 (SUTURE) IMPLANT
SUT VIC AB 2-0 SH 27 (SUTURE) ×2
SUT VIC AB 2-0 SH 27X BRD (SUTURE) IMPLANT
SUTURE RELOAD END STTCH 2 48X1 (ENDOMECHANICALS) ×6 IMPLANT
SUTURE RELOAD ENDO STITCH 2.0 (ENDOMECHANICALS) ×5 IMPLANT
SYR 10ML ECCENTRIC (SYRINGE) ×2 IMPLANT
SYR 20CC LL (SYRINGE) ×4 IMPLANT
SYR 50ML LL SCALE MARK (SYRINGE) ×2 IMPLANT
TIP RIGID 35CM EVICEL (HEMOSTASIS) ×2 IMPLANT
TOWEL OR 17X26 10 PK STRL BLUE (TOWEL DISPOSABLE) ×2 IMPLANT
TOWEL OR NON WOVEN STRL DISP B (DISPOSABLE) ×2 IMPLANT
TRAY FOLEY MTR SLVR 16FR STAT (SET/KITS/TRAYS/PACK) ×2 IMPLANT
TROCAR 12M 150ML BLUNT (TROCAR) ×1 IMPLANT
TROCAR ADV FIXATION 12X100MM (TROCAR) ×2 IMPLANT
TROCAR ADV FIXATION 5X100MM (TROCAR) ×2 IMPLANT
TROCAR BLADELESS OPT 5 100 (ENDOMECHANICALS) ×2 IMPLANT
TROCAR XCEL 12X100 BLDLESS (ENDOMECHANICALS) ×2 IMPLANT
TUBE CALIBRATION LAPBAND (TUBING) ×1 IMPLANT
TUBING CONNECTING 10 (TUBING) ×4 IMPLANT
TUBING ENDO SMARTCAP PENTAX (MISCELLANEOUS) ×2 IMPLANT
TUBING INSUF HEATED (TUBING) ×2 IMPLANT

## 2018-05-02 NOTE — Anesthesia Procedure Notes (Signed)
Procedure Name: Intubation Date/Time: 05/02/2018 7:34 AM Performed by: Claudia Desanctis, CRNA Pre-anesthesia Checklist: Patient identified, Emergency Drugs available, Suction available and Patient being monitored Patient Re-evaluated:Patient Re-evaluated prior to induction Oxygen Delivery Method: Circle system utilized Preoxygenation: Pre-oxygenation with 100% oxygen Induction Type: IV induction Ventilation: Mask ventilation without difficulty Laryngoscope Size: 2 and Miller Grade View: Grade I Tube type: Oral Tube size: 8.0 mm Number of attempts: 1 Airway Equipment and Method: Stylet Placement Confirmation: ETT inserted through vocal cords under direct vision,  positive ETCO2 and breath sounds checked- equal and bilateral Secured at: 21 cm Tube secured with: Tape Dental Injury: Teeth and Oropharynx as per pre-operative assessment

## 2018-05-02 NOTE — Interval H&P Note (Signed)
History and Physical Interval Note:  05/02/2018 7:25 AM  Joseph Wood  has presented today for surgery, with the diagnosis of MORBID OBESITY  The various methods of treatment have been discussed with the patient and family. After consideration of risks, benefits and other options for treatment, the patient has consented to  Procedure(s): LAPAROSCOPIC ROUX-EN-Y GASTRIC BYPASS WITH UPPER ENDOSCOPY (N/A) as a surgical intervention .  The patient's history has been reviewed, patient examined, no change in status, stable for surgery.  I have reviewed the patient's chart and labs.  Questions were answered to the patient's satisfaction.     Darene Lamer Leshon Armistead

## 2018-05-02 NOTE — Anesthesia Postprocedure Evaluation (Signed)
Anesthesia Post Note  Patient: Joseph Wood  Procedure(s) Performed: LAPAROSCOPIC ROUX-EN-Y GASTRIC BYPASS WITH UPPER ENDOSCOPY (N/A Abdomen)     Patient location during evaluation: PACU Anesthesia Type: General Level of consciousness: awake and alert Pain management: pain level controlled Vital Signs Assessment: post-procedure vital signs reviewed and stable Respiratory status: spontaneous breathing, nonlabored ventilation, respiratory function stable and patient connected to nasal cannula oxygen Cardiovascular status: blood pressure returned to baseline and stable Postop Assessment: no apparent nausea or vomiting Anesthetic complications: no    Last Vitals:  Vitals:   05/02/18 1330 05/02/18 1354  BP: 131/80 123/72  Pulse: 66 72  Resp: (!) 21 16  Temp: 36.9 C 36.5 C  SpO2: 95% 98%    Last Pain:  Vitals:   05/02/18 1330  TempSrc:   PainSc: Asleep                 Montez Hageman

## 2018-05-02 NOTE — Op Note (Signed)
Joseph Wood [id number] 1971-09-25 05/02/2018  Preoperative diagnosis: roux en Y gastric bypass in progress  Postoperative diagnosis: Same   Procedure: Upper endoscopy   Surgeon: Catalina Antigua B. Hassell Done  M.D., FACS   Anesthesia: Gen.   Indications for procedure: This patient was undergoing a gastric bypass by Dr. Excell Seltzer.    Description of procedure: The endoscopy was placed in the mouth and into the oropharynx and under endoscopic vision it was advanced to the esophagogastric junction at about 43 cm.  The pouch was insufflated and was linear and staples intact.  The G-J looked good.   No bleeding or leaks were detected.  The scope was withdrawn without difficulty.     Matt B. Hassell Done, MD, FACS General, Bariatric, & Minimally Invasive Surgery [facility], [place]

## 2018-05-02 NOTE — Op Note (Signed)
Preop diagnosis: Morbid obesity  Postop diagnosis: Morbid obesity  Body mass index is 54.42 kg/m.  Surgical procedure: Laparoscopic Roux-en-Y gastric bypass  Surgeon: Marland Kitchen T.Alanya Vukelich M.D.  Asst.: Johnathan Hausen M.D.  Anesthesia: General  Complications:  None  EBL: Minimal  Drains: None  Disposition: PACU in good condition  Description of procedure: Patient is brought to the operating room and general anesthesia induced. She had received preoperative broad-spectrum IV antibiotics and subcutaneous heparin. The abdomen was widely sterilely prepped and draped. Patient timeout was performed and correct patient and procedure confirmed. Access was obtained with a 12 mm Optiview trocar in the left upper quadrant and pneumoperitoneum established without difficulty. Under direct vision 12 mm trocars were placed laterally in the right upper quadrant, right upper quadrant midclavicular line, and to the left and above the umbilicus for the camera port. A 5 mm trocar was placed laterally in the left upper quadrant.  A bilateral T AP block was performed under direct vision with dilute Exparel.  The omentum was brought into the upper abdomen and the transverse mesocolon elevated and the ligament of Treitz clearly identified. A 40 cm biliopancreatic limb was then carefully measured from the ligament of Treitz. The small intestine was divided at this point with a single firing of the white load linear stapler. A Penrose drain was sutured to the end of the Roux-en-Y limb for later identification. A 100 cm Roux-en-Y limb was then carefully measured. At this point a side-to-side anastomosis was created between the Roux limb and the end of the biliopancreatic limb. This was accomplished with a single firing of the 45 mm white load linear stapler. The common enterotomy was closed with a running 2-0 Vicryl begun at either end of the enterotomy and tied centrally. The mesenteric defect was then closed with running  2-0 silk. The omentum was then divided with the harmonic scalpel up towards the transverse colon to allow mobility of the Roux limb toward the gastric pouch. The patient was then placed in steep reversed Trendelenburg. Through a 5 mm subxiphoid site the [facility] retractor was placed and the left lobe of the liver elevated with excellent exposure of the upper stomach and hiatus. The angle of Hiss was then mobilized with the harmonic scalpel. A 4 cm gastric pouch was then carefully measured along the lesser curve of the stomach. Dissection was carried along the lesser curve at this point with the Harmonic scalpel working carefully back toward the lesser sac at right angles to the lesser curve. The free lesser sac was then entered. After being sure all tubes were removed from the stomach an initial firing of the gold load 60 mm linear stapler was fired at right angles across the lesser curve for about 4 cm. The gastric pouch was further mobilized posteriorly and then the pouch was completed with 3 further firings of the 60 mm blue load linear stapler up through the previously dissected angle of His. It was ensured that the pouch was completely mobilized away from the gastric remnant. This created a nice tubular 5-6 cm gastric pouch. The staple line of the gastric remnant was then oversewn with 2-0 silk for hemostasis. The Roux limb was then brought up in an antecolic fashion with the candycane facing to the patient's left without undue tension. The gastrojejunostomy was created with an initial posterior row of 2-0 Vicryl between the Roux limb and the staple line of the gastric pouch. Enterotomies were then made in the gastric pouch and the Roux limb  with the harmonic scalpel and at approximately 2-2-1/2 cm anastomosis was created with a single firing of the blue load linear stapler. The staple line was inspected and was intact without bleeding. The common enterotomy was then closed with running 2-0 Vicryl begun at  either end and tied centrally. The wall tube was then easily passed through the anastomosis and an outer anterior layer of running 2-0 Vicryl was placed. The Ewald tube was removed. With the outlet of the gastrojejunostomy clamped and under saline irrigation the assistant performed upper endoscopy and with the gastric pouch tensely distended with air there was no evidence of leak. The pouch was desufflated. The Terance Hart defect was closed with running 2-0 silk. The abdomen was inspected for any evidence of bleeding or bowel injury and everything looked fine. The Nathanson retractor was removed under direct vision after coating the anastomosis with Tisseel tissue sealant. All CO2 was evacuated and trochars removed. Skin incisions were closed with staples. Sponge needle and instrument counts were correct. The patient was taken to the PACU in good condition.     Edward Jolly MD, FACS  05/02/2018, 11:23 AM

## 2018-05-02 NOTE — Anesthesia Preprocedure Evaluation (Signed)
Anesthesia Evaluation  Patient identified by MRN, date of birth, ID band Patient awake    Reviewed: Allergy & Precautions, NPO status , Patient's Chart, lab work & pertinent test results  Airway Mallampati: II  TM Distance: >3 FB Neck ROM: Full    Dental no notable dental hx. (+) Partial Lower   Pulmonary sleep apnea and Continuous Positive Airway Pressure Ventilation , former smoker,    Pulmonary exam normal breath sounds clear to auscultation       Cardiovascular hypertension, Pt. on medications Normal cardiovascular exam Rhythm:Regular Rate:Normal     Neuro/Psych negative neurological ROS  negative psych ROS   GI/Hepatic negative GI ROS, Neg liver ROS,   Endo/Other  diabetes, Type 2Morbid obesity  Renal/GU negative Renal ROS  negative genitourinary   Musculoskeletal negative musculoskeletal ROS (+)   Abdominal   Peds negative pediatric ROS (+)  Hematology negative hematology ROS (+)   Anesthesia Other Findings   Reproductive/Obstetrics negative OB ROS                             Anesthesia Physical Anesthesia Plan  ASA: III  Anesthesia Plan: General   Post-op Pain Management:    Induction: Intravenous  PONV Risk Score and Plan: 3 and Ondansetron, Dexamethasone, Midazolam, Scopolamine patch - Pre-op and Treatment may vary due to age or medical condition  Airway Management Planned: Oral ETT  Additional Equipment:   Intra-op Plan:   Post-operative Plan: Extubation in OR  Informed Consent: I have reviewed the patients History and Physical, chart, labs and discussed the procedure including the risks, benefits and alternatives for the proposed anesthesia with the patient or authorized representative who has indicated his/her understanding and acceptance.   Dental advisory given  Plan Discussed with: CRNA  Anesthesia Plan Comments:         Anesthesia Quick  Evaluation

## 2018-05-02 NOTE — Transfer of Care (Signed)
Immediate Anesthesia Transfer of Care Note  Patient: Joseph Wood  Procedure(s) Performed: LAPAROSCOPIC ROUX-EN-Y GASTRIC BYPASS WITH UPPER ENDOSCOPY (N/A Abdomen)  Patient Location: PACU  Anesthesia Type:General  Level of Consciousness: awake, alert , oriented and patient cooperative  Airway & Oxygen Therapy: Patient Spontanous Breathing and Patient connected to face mask  Post-op Assessment: Report given to RN and Post -op Vital signs reviewed and stable  Post vital signs: Reviewed and stable  Last Vitals:  Vitals Value Taken Time  BP 131/63 05/02/2018 11:35 AM  Temp    Pulse 75 05/02/2018 11:38 AM  Resp 27 05/02/2018 11:38 AM  SpO2 99 % 05/02/2018 11:38 AM  Vitals shown include unvalidated device data.  Last Pain:  Vitals:   05/02/18 0628  TempSrc:   PainSc: 0-No pain         Complications: No apparent anesthesia complications

## 2018-05-02 NOTE — Progress Notes (Signed)
Discussed post op day goals with patient including ambulation, IS, diet progression, pain, and nausea control.  Questions answered. 

## 2018-05-03 LAB — CBC WITH DIFFERENTIAL/PLATELET
BASOS ABS: 0 10*3/uL (ref 0.0–0.1)
BASOS PCT: 0 %
EOS ABS: 0 10*3/uL (ref 0.0–0.7)
Eosinophils Relative: 0 %
HEMATOCRIT: 40.1 % (ref 39.0–52.0)
HEMOGLOBIN: 13.4 g/dL (ref 13.0–17.0)
Lymphocytes Relative: 14 %
Lymphs Abs: 1.2 10*3/uL (ref 0.7–4.0)
MCH: 30 pg (ref 26.0–34.0)
MCHC: 33.4 g/dL (ref 30.0–36.0)
MCV: 89.7 fL (ref 78.0–100.0)
MONOS PCT: 7 %
Monocytes Absolute: 0.6 10*3/uL (ref 0.1–1.0)
NEUTROS ABS: 6.9 10*3/uL (ref 1.7–7.7)
NEUTROS PCT: 79 %
Platelets: 242 10*3/uL (ref 150–400)
RBC: 4.47 MIL/uL (ref 4.22–5.81)
RDW: 13.8 % (ref 11.5–15.5)
WBC: 8.7 10*3/uL (ref 4.0–10.5)

## 2018-05-03 LAB — GLUCOSE, CAPILLARY
GLUCOSE-CAPILLARY: 84 mg/dL (ref 65–99)
GLUCOSE-CAPILLARY: 64 mg/dL — AB (ref 65–99)
Glucose-Capillary: 115 mg/dL — ABNORMAL HIGH (ref 65–99)
Glucose-Capillary: 114 mg/dL — ABNORMAL HIGH (ref 65–99)
Glucose-Capillary: 81 mg/dL (ref 65–99)
Glucose-Capillary: 75 mg/dL (ref 65–99)
Glucose-Capillary: 70 mg/dL (ref 65–99)

## 2018-05-03 MED ORDER — IRBESARTAN 300 MG PO TABS
300.0000 mg | ORAL_TABLET | Freq: Every day | ORAL | Status: DC
  Administered 2018-05-03: 300 mg via ORAL
  Filled 2018-05-03: qty 1

## 2018-05-03 MED ORDER — HYDRALAZINE HCL 20 MG/ML IJ SOLN: 20.0000 mg | INTRAMUSCULAR | Status: DC | PRN

## 2018-05-03 MED ORDER — GLUCOSE 4 G PO CHEW
CHEWABLE_TABLET | ORAL | Status: AC
  Filled 2018-05-03: qty 1

## 2018-05-03 MED ORDER — DEXTROSE 50 % IV SOLN
INTRAVENOUS | Status: AC
  Administered 2018-05-03: 25 mL
  Filled 2018-05-03: qty 50

## 2018-05-03 NOTE — Plan of Care (Signed)
Nutrition Education Note  Received consult for diet education per DROP protocol.   Discussed 2 week post op diet with pt. Emphasized that liquids must be non carbonated, non caffeinated, and sugar free. Fluid goals discussed. Pt to follow up with outpatient bariatric RD for further diet progression after 2 weeks. Multivitamins and minerals also reviewed. Teach back method used, pt expressed understanding, expect good compliance.   Diet: First 2 Weeks  You will see the dietitian about two (2) weeks after your surgery. The dietitian will increase the types of foods you can eat if you are handling liquids well:  If you have severe vomiting or nausea and cannot handle clear liquids lasting longer than 1 day, call your surgeon  Protein Shake  Drink at least 2 ounces of shake 5-6 times per day  Each serving of protein shakes (usually 8 - 12 ounces) should have a minimum of:  15 grams of protein  And no more than 5 grams of carbohydrate  Goal for protein each day:  Men = 80 grams per day  Women = 60 grams per day  Protein powder may be added to fluids such as non-fat milk or Lactaid milk or Soy milk (limit to 35 grams added protein powder per serving)   Hydration  Slowly increase the amount of water and other clear liquids as tolerated (See Acceptable Fluids)  Slowly increase the amount of protein shake as tolerated  Sip fluids slowly and throughout the day  May use sugar substitutes in small amounts (no more than 6 - 8 packets per day; i.e. Splenda)   Fluid Goal  The first goal is to drink at least 8 ounces of protein shake/drink per day (or as directed by the nutritionist); some examples of protein shakes are Premier Protein, [employer], [employer], EAS Edge HP, and Unjury. See handout from pre-op Bariatric Education Class:  Slowly increase the amount of protein shake you drink as tolerated  You may find it easier to slowly sip shakes throughout the day  It is important to get your  proteins in first  Your fluid goal is to drink 64 - 100 ounces of fluid daily  It may take a few weeks to build up to this  32 oz (or more) should be clear liquids  And  32 oz (or more) should be full liquids (see below for examples)  Liquids should not contain sugar, caffeine, or carbonation   Clear Liquids:  Water or Sugar-free flavored water (i.e. Fruit H2O, Propel)  Decaffeinated coffee or tea (sugar-free)  Crystal Lite, Wyler?s Lite, Minute Maid Lite  Sugar-free Jell-O  Bouillon or broth  Sugar-free Popsicle: *Less than 20 calories each; Limit 1 per day   Full Liquids:  Protein Shakes/Drinks + 2 choices per day of other full liquids  Full liquids must be:  No More Than 12 grams of Carbs per serving  No More Than 3 grams of Fat per serving  Strained low-fat cream soup  Non-Fat milk  Fat-free Lactaid Milk  Sugar-free yogurt (Dannon Lite & Fit, [place] yogurt, Oikos Zero)   Clayton Bibles, MS, RD, LDN [place] Dietitian Pager: [phone] After Hours Pager: [phone]

## 2018-05-03 NOTE — Progress Notes (Signed)
Pt started drinking first 2oz cup of protein at 0730.

## 2018-05-03 NOTE — Progress Notes (Signed)
Patient alert and oriented, Post op day 1.  Provided support and encouragement.  Encouraged pulmonary toilet, ambulation and small sips of liquids.  Completed 12ounces of bari clear fluids and started protein shakes.  All questions answered.  Will continue to monitor.

## 2018-05-03 NOTE — Progress Notes (Signed)
Pt. able to place on own, humidifier filled, aware to notify if needed.

## 2018-05-03 NOTE — Progress Notes (Addendum)
Patient ID: [name], male   DOB: Jul 04, 1971, 47 y.o.   MRN: [id number] 1 Day Post-Op   Subjective: No complaints this morning.  Denies pain.  Had slight nausea earlier yesterday which has resolved.  Tolerated clears last night and starting protein shakes this morning without difficulty.  Has ambulated in the hall multiple times.  Objective: Vital signs in last 24 hours: Temp:  [97.5 F (36.4 C)-98.4 F (36.9 C)] 98.1 F (36.7 C) (05/22 0544) Pulse Rate:  [50-74] 51 (05/22 0544) Resp:  [13-24] 14 (05/22 0544) BP: (114-165)/(60-95) 158/95 (05/22 0544) SpO2:  [94 %-100 %] 98 % (05/22 0544) Weight:  [163.7 kg (360 lb 14.4 oz)] 163.7 kg (360 lb 14.4 oz) (05/22 0544) Last BM Date: 05/01/18  Intake/Output from previous day: 05/21 0701 - 05/22 0700 In: 4655 [P.O.:860; I.V.:3645; IV Piggyback:150] Out: 500 [Urine:475; Blood:25] Intake/Output this shift: Total I/O In: 60 [P.O.:60] Out: 650 [Urine:650]  General appearance: alert, cooperative and no distress Resp: No wheezing or increased work of breathing GI: normal findings: soft, non-tender Incision/Wound: No erythema or drainage  Lab Results:  Recent Labs    05/02/18 1451 05/03/18 0423  WBC  --  8.7  HGB 14.7 13.4  HCT 43.7 40.1  PLT  --  242   BMET No results for input(s): NA, K, CL, CO2, GLUCOSE, BUN, CREATININE, CALCIUM in the last 72 hours.  CBG (last 3)  Recent Labs    05/02/18 2307 05/03/18 0345 05/03/18 0735  GLUCAP 100* 115* 114*     Studies/Results: No results found.  Anti-infectives: Anti-infectives (From admission, onward)   Start     Dose/Rate Route Frequency Ordered Stop   05/02/18 0600  cefoTEtan in Dextrose 5% (CEFOTAN) IVPB 2 g     2 g Intravenous On call to O.R. 05/02/18 0542 05/02/18 0754      Assessment/Plan: s/p Procedure(s): LAPAROSCOPIC ROUX-EN-Y GASTRIC BYPASS WITH UPPER ENDOSCOPY Doing well postoperatively without apparent complication.  Starting protein shakes.  Observe  today and monitor intake.  BP running a little high.  Add PRN hydralazine and restart home med.  Anticipate discharge tomorrow.   LOS: 1 day    Edward Jolly 05/03/2018

## 2018-05-04 LAB — CBC WITH DIFFERENTIAL/PLATELET
Basophils Absolute: 0 10*3/uL (ref 0.0–0.1)
Basophils Relative: 0 %
Eosinophils Absolute: 0.2 10*3/uL (ref 0.0–0.7)
Eosinophils Relative: 2 %
HEMATOCRIT: 38.4 % — AB (ref 39.0–52.0)
HEMOGLOBIN: 12.8 g/dL — AB (ref 13.0–17.0)
LYMPHS ABS: 2.4 10*3/uL (ref 0.7–4.0)
LYMPHS PCT: 30 %
MCH: 30 pg (ref 26.0–34.0)
MCHC: 33.3 g/dL (ref 30.0–36.0)
MCV: 90.1 fL (ref 78.0–100.0)
MONOS PCT: 8 %
Monocytes Absolute: 0.6 10*3/uL (ref 0.1–1.0)
NEUTROS ABS: 4.7 10*3/uL (ref 1.7–7.7)
NEUTROS PCT: 60 %
Platelets: 195 10*3/uL (ref 150–400)
RBC: 4.26 MIL/uL (ref 4.22–5.81)
RDW: 14.1 % (ref 11.5–15.5)
WBC: 7.8 10*3/uL (ref 4.0–10.5)

## 2018-05-04 LAB — GLUCOSE, CAPILLARY
GLUCOSE-CAPILLARY: 110 mg/dL — AB (ref 65–99)
GLUCOSE-CAPILLARY: 126 mg/dL — AB (ref 65–99)
GLUCOSE-CAPILLARY: 66 mg/dL (ref 65–99)
GLUCOSE-CAPILLARY: 70 mg/dL (ref 65–99)
Glucose-Capillary: 72 mg/dL (ref 65–99)
Glucose-Capillary: 67 mg/dL (ref 65–99)
Glucose-Capillary: 83 mg/dL (ref 65–99)
Glucose-Capillary: 67 mg/dL (ref 65–99)
Glucose-Capillary: 63 mg/dL — ABNORMAL LOW (ref 65–99)
Glucose-Capillary: 80 mg/dL (ref 65–99)

## 2018-05-04 LAB — HEMOGLOBIN AND HEMATOCRIT, BLOOD
HCT: 37.9 % — ABNORMAL LOW (ref 39.0–52.0)
HEMATOCRIT: 40.6 % (ref 39.0–52.0)
HEMATOCRIT: 38.4 % — AB (ref 39.0–52.0)
HEMOGLOBIN: 13.5 g/dL (ref 13.0–17.0)
HEMOGLOBIN: 12.6 g/dL — AB (ref 13.0–17.0)
HEMOGLOBIN: 12.6 g/dL — AB (ref 13.0–17.0)

## 2018-05-04 MED ORDER — LACTATED RINGERS IV BOLUS
500.0000 mL | Freq: Once | INTRAVENOUS | Status: AC
  Administered 2018-05-04: 500 mL via INTRAVENOUS

## 2018-05-04 MED ORDER — DEXTROSE 50 % IV SOLN
INTRAVENOUS | Status: AC
Start: 2018-05-04 — End: 2018-05-04
  Administered 2018-05-04: 25 mL
  Filled 2018-05-04: qty 50

## 2018-05-04 NOTE — Progress Notes (Signed)
Patient alert and oriented, Post op day 2.  Provided support and encouragement.  Encouraged pulmonary toilet, ambulation and small sips of liquids.  Low blood sugar this am of 67.  Treated blood sugar current cbg 126. We discussed how to treat low blood sugars at home.  Education on hypoglycemia added to AVS for further education.  All questions answered.  Will continue to monitor.

## 2018-05-04 NOTE — Progress Notes (Signed)
Patient ID: Joseph Wood, male   DOB: 1971-03-20, 47 y.o.   MRN: [id number] 2 Days Post-Op   Subjective: Patient had 2 bloody bowel movements this morning.  Otherwise feels well with just some mild abdominal cramping.  No severe pain or nausea.  Has been ambulatory.  Tolerating fluids well.  Objective: Vital signs in last 24 hours: Temp:  [97.6 F (36.4 C)-98.5 F (36.9 C)] 97.8 F (36.6 C) (05/23 0456) Pulse Rate:  [47-61] 52 (05/23 0456) Resp:  [13-18] 15 (05/23 0456) BP: (112-142)/(63-74) 112/69 (05/23 0456) SpO2:  [96 %-100 %] 100 % (05/23 0456) Weight:  [169.7 kg (374 lb 1.9 oz)] 169.7 kg (374 lb 1.9 oz) (05/23 0625) Last BM Date: 05/04/18  Intake/Output from previous day: 05/22 0701 - 05/23 0700 In: 3066.7 [P.O.:720; I.V.:2246.7; IV Piggyback:100] Out: 2400 [Urine:2400] Intake/Output this shift: No intake/output data recorded.  General appearance: alert, cooperative and no distress GI: normal findings: soft, non-tender Incision/Wound: No erythema or drainage  Lab Results:  Recent Labs    05/03/18 0423 05/04/18 0509  WBC 8.7 7.8  HGB 13.4 12.8*  HCT 40.1 38.4*  PLT 242 195   BMET No results for input(s): NA, K, CL, CO2, GLUCOSE, BUN, CREATININE, CALCIUM in the last 72 hours.   Studies/Results: No results found.  Anti-infectives: Anti-infectives (From admission, onward)   Start     Dose/Rate Route Frequency Ordered Stop   05/02/18 0600  cefoTEtan in Dextrose 5% (CEFOTAN) IVPB 2 g     2 g Intravenous On call to O.R. 05/02/18 0542 05/02/18 0754      Assessment/Plan: s/p Procedure(s): LAPAROSCOPIC ROUX-EN-Y GASTRIC BYPASS WITH UPPER ENDOSCOPY Two bloody bowel movements this morning.  Possible staple line bleed.  Vital signs are stable and hemoglobin is only minimally down.  Will observe in the hospital today.  Hold Lovenox.  Repeat hemoglobin and hematocrit later today.   LOS: 2 days    Edward Jolly 05/04/2018

## 2018-05-04 NOTE — Progress Notes (Signed)
Two episodes of hypoglycemia.  At 2300 CBG was 67 given 25 g of D50.  CBG rechecked at 2350 and CBG was 110, Blood sugar maintained at 72 around 0400  check.. Then 0600 CBG was spot check showed CBG of 64, Patient was given 25g  D50  and it came to 120.  Patient had no sign of hypoglycemia during all these times.  He was alert and oriented x 4.  Walking and talking and drinking Protein shakes.

## 2018-05-04 NOTE — Progress Notes (Signed)
Patient passed what appear to be old dried blood in stool at around 0430 on first stool since surgery.  Will report to coming nurse for follow up.

## 2018-05-05 LAB — GLUCOSE, CAPILLARY
GLUCOSE-CAPILLARY: 90 mg/dL (ref 65–99)
Glucose-Capillary: 109 mg/dL — ABNORMAL HIGH (ref 65–99)
Glucose-Capillary: 63 mg/dL — ABNORMAL LOW (ref 65–99)
Glucose-Capillary: 69 mg/dL (ref 65–99)

## 2018-05-05 LAB — CBC
HEMATOCRIT: 34.6 % — AB (ref 39.0–52.0)
HEMOGLOBIN: 11.6 g/dL — AB (ref 13.0–17.0)
MCH: 30.1 pg (ref 26.0–34.0)
MCHC: 33.5 g/dL (ref 30.0–36.0)
MCV: 89.9 fL (ref 78.0–100.0)
PLATELETS: 216 10*3/uL (ref 150–400)
RBC: 3.85 MIL/uL — AB (ref 4.22–5.81)
RDW: 14.1 % (ref 11.5–15.5)
WBC: 6.5 10*3/uL (ref 4.0–10.5)

## 2018-05-05 MED ORDER — DEXTROSE 50 % IV SOLN
1.0000 | Freq: Once | INTRAVENOUS | Status: AC
  Administered 2018-05-05: 50 mL via INTRAVENOUS
  Filled 2018-05-05: qty 50

## 2018-05-05 MED ORDER — KCL IN DEXTROSE-NACL 20-5-0.9 MEQ/L-%-% IV SOLN
INTRAVENOUS | Status: DC
  Administered 2018-05-05: 1000 mL via INTRAVENOUS
  Filled 2018-05-05 (×2): qty 1000

## 2018-05-05 MED ORDER — ASPIRIN EC 81 MG PO TBEC: 81.0000 mg | DELAYED_RELEASE_TABLET | Freq: Every day | ORAL | 3 refills | Status: DC

## 2018-05-05 NOTE — Progress Notes (Signed)
Patient ID: Joseph Wood, male   DOB: Dec 16, 1970, 47 y.o.   MRN: [id number] 3 Days Post-Op   Subjective: Reports he is doing well this morning.  Has had a bowel movement with very minimal old blood, much improved from yesterday.  Denies abdominal pain or nausea.  Up ambulatory and feels ready to go home.  Objective: Vital signs in last 24 hours: Temp:  [97.7 F (36.5 C)-98.3 F (36.8 C)] 98.2 F (36.8 C) (05/24 0623) Pulse Rate:  [49-66] 49 (05/24 0623) Resp:  [16-20] 16 (05/24 0623) BP: (100-133)/(47-81) 118/49 (05/24 0623) SpO2:  [91 %-100 %] 91 % (05/24 0623) Last BM Date: 05/04/18  Intake/Output from previous day: 05/23 0701 - 05/24 0700 In: 2211.7 [P.O.:420; I.V.:1691.7; IV Piggyback:100] Out: 550 [Urine:550] Intake/Output this shift: No intake/output data recorded.  General appearance: alert, cooperative and no distress GI: normal findings: soft, non-tender Incision/Wound: No erythema or drainage  Lab Results:  Recent Labs    05/04/18 0509  05/04/18 1942 05/05/18 0425  WBC 7.8  --   --  6.5  HGB 12.8*   < > 12.6* 11.6*  HCT 38.4*   < > 37.9* 34.6*  PLT 195  --   --  216   < > = values in this interval not displayed.   BMET No results for input(s): NA, K, CL, CO2, GLUCOSE, BUN, CREATININE, CALCIUM in the last 72 hours.   Studies/Results: No results found.  Anti-infectives: Anti-infectives (From admission, onward)   Start     Dose/Rate Route Frequency Ordered Stop   05/02/18 0600  cefoTEtan in Dextrose 5% (CEFOTAN) IVPB 2 g     2 g Intravenous On call to O.R. 05/02/18 0542 05/02/18 0754      Assessment/Plan: s/p Procedure(s): LAPAROSCOPIC ROUX-EN-Y GASTRIC BYPASS WITH UPPER ENDOSCOPY Doing well today.  Some apparent postoperative bleeding from staple line or anastomosis but hemoglobin has remained fairly stable with dropping just one-point and patient is stable.  Clinical bleeding has just about stopped.  Okay for discharge today.   LOS: 3 days     Edward Jolly 05/05/2018

## 2018-05-05 NOTE — Progress Notes (Signed)
Patient alert and oriented, pain is controlled. Patient is tolerating fluids, advanced to protein shake today, patient is tolerating well. Reviewed Gastric Bypass discharge instructions with patient and patient is able to articulate understanding. Provided information on BELT program, Support Group and [facility] outpatient pharmacy. All questions answered, will continue to monitor.   Total 24 hour fluid recall 600 Per dehydration protocol call back Tuesday 5-28

## 2018-05-05 NOTE — Discharge Summary (Signed)
Patient ID: Joseph Wood [id number] 47 y.o. [birth year]  05/02/2018  Discharge date and time: 05/05/2018   Admitting Physician: Edward Jolly  Discharge Physician: Edward Jolly  Admission Diagnoses: MORBID OBESITY  Discharge Diagnoses: Same  Operations: Procedure(s): LAPAROSCOPIC ROUX-EN-Y GASTRIC BYPASS WITH UPPER ENDOSCOPY  Admission Condition: good  Discharged Condition: good  Indication for Admission: Patient with progressive morbid obesity unresponsive to multiple efforts at nonsurgical weight loss who presents with a BMI of 60 and comorbidities of chronic joint and foot pain and DJD, adult-onset diabetes mellitus, hyperlipidemia, hypertension and significant GERD requiring prescription medications.  After extensive preoperative work-up and discussion detailed elsewhere we have elected to proceed with laparoscopic Roux-en-Y gastric bypass surgical treatment for his morbid obesity.    Hospital Course: On the morning of admission the patient underwent an uneventful laparoscopic Roux-en-Y gastric bypass.  His initial postoperative course over the first 24 hours was unremarkable.  He had minimal pain and no nausea and was started on protein shakes.  CBC was normal and vital signs stable.  On the second postoperative day he began to have intermittent bloody bowel movements.  Lovenox was held.  Vital signs were stable.  No abdominal pain.  His hemoglobin was followed closely and had actually minimal decrease over 24 hours from 12.6-11.6.  On the morning of discharge she states he had a bowel movement and the blood was extremely minimal and much improved.  Up ambulating in the halls and feeling well.  Abdomen is nontender.  Wounds clean.  He is felt ready for discharge.   Disposition: Home  Patient Instructions:  Allergies as of 05/05/2018      Reactions   Benazepril Hcl Cough   Severe cough   Tramadol Other (See Comments)   Severe dizziness      Medication List     TAKE these medications   aspirin EC 81 MG tablet Take 1 tablet (81 mg total) by mouth daily. Wait 1 week before resuming What changed:  additional instructions   atorvastatin 10 MG tablet Commonly known as:  LIPITOR Take 1 tablet (10 mg total) by mouth daily. Office visit needed for additional refills   BARIATRIC MULTIVITAMINS/IRON PO Take 1 tablet by mouth daily.   BAYER CONTOUR NEXT MONITOR w/Device Kit Use to check blood sugar DX E11.8   calcium-vitamin D 500-200 MG-UNIT tablet Commonly known as:  OSCAL WITH D Take 1 tablet by mouth 3 (three) times daily.   glucose blood test strip Commonly known as:  BAYER CONTOUR NEXT TEST Use as instructed DX E11.8   ONETOUCH VERIO test strip Generic drug:  glucose blood TEST UP TO THREE TIMES DAILY   metFORMIN 500 MG tablet Commonly known as:  GLUCOPHAGE TAKE 1 TABLET BY MOUTH TWICE DAILY WITH A MEAL What changed:  See the new instructions.   omeprazole 20 MG capsule Commonly known as:  PRILOSEC TAKE ONE CAPSULE BY MOUTH ONCE DAILY What changed:    how much to take  how to take this  when to take this   ondansetron 4 MG tablet Commonly known as:  ZOFRAN Take 4 mg by mouth every 6 (six) hours as needed for nausea or vomiting.   onetouch ultrasoft lancets Use to test blood sugar tid. DX E11.9   oxyCODONE 5 MG/5ML solution Commonly known as:  ROXICODONE Take 5-10 mg by mouth every 4 (four) hours as needed (pain).   telmisartan 80 MG tablet Commonly known as:  MICARDIS TAKE 1 TABLET BY MOUTH ONCE  DAILY What changed:    how much to take  how to take this  when to take this       Activity: activity as tolerated Diet: Bariatric protein shakes Wound Care: none needed  Follow-up:  With Dr. Excell Seltzer in 3 weeks.  Signed: Edward Jolly MD, FACS  05/05/2018, 7:18 AM

## 2018-05-05 NOTE — Discharge Instructions (Signed)
Hypoglycemia Hypoglycemia is when the sugar (glucose) level in the blood is too low. Symptoms of low blood sugar may include:  Feeling: ? Hungry. ? Worried or nervous (anxious). ? Sweaty and clammy. ? Confused. ? Dizzy. ? Sleepy. ? Sick to your stomach (nauseous).  Having: ? A fast heartbeat. ? A headache. ? A change in your vision. ? Jerky movements that you cannot control (seizure). ? Nightmares. ? Tingling or no feeling (numbness) around the mouth, lips, or tongue.  Having trouble with: ? Talking. ? Paying attention (concentrating). ? Moving (coordination). ? Sleeping.  Shaking.  Passing out (fainting).  Getting upset easily (irritability).  Low blood sugar can happen to people who have diabetes and people who do not have diabetes. Low blood sugar can happen quickly, and it can be an emergency. Treating Low Blood Sugar Low blood sugar is often treated by eating or drinking something sugary right away. If you can think clearly and swallow safely, follow the 15:15 rule:  Take 15 grams of a fast-acting carb (carbohydrate). Some fast-acting carbs are: ? 1 tube of glucose gel. ? 3 sugar tablets (glucose pills). ? 4 oz (120 mL) of fruit juice.  Check your blood sugar 15 minutes after you take the carb.  If your blood sugar is still at or below 70 mg/dL (3.9 mmol/L), take 15 grams of a carb again.  If your blood sugar does not go above 70 mg/dL (3.9 mmol/L) after 3 tries, get help right away.  After your blood sugar goes back to normal, eat a meal or a snack within 1 hour.  Treating Very Low Blood Sugar If your blood sugar is at or below 54 mg/dL (3 mmol/L), you have very low blood sugar (severe hypoglycemia). This is an emergency. Do not wait to see if the symptoms will go away. Get medical help right away. Call your local emergency services (911 in the [place] Do not drive yourself to the hospital. If you have very low blood sugar and you cannot eat or drink, you may  need a glucagon shot (injection). A family member or friend should learn how to check your blood sugar and how to give you a glucagon shot. Ask your doctor if you need to have a glucagon shot kit at home. Follow these instructions at home: General instructions  Avoid any diets that cause you to not eat enough food. Talk with your doctor before you start any new diet.  Take over-the-counter and prescription medicines only as told by your doctor.  Keep all follow-up visits as told by your doctor. This is important. If You Have Diabetes:   Make sure you know the symptoms of low blood sugar.  Always keep a source of sugar with you, such as:  ? Sugar tablets. ? Glucose gel. ? Fruit juice. ? Milk. ? Honey.  Take your medicines as told.  Follow your exercise and meal plan. ? Eat on time. Do not skip meals. ? Follow your sick day plan when you cannot eat or drink normally. Make this plan ahead of time with your doctor.  Check your blood sugar as often as told by your doctor. Always check before and after exercise.  Share your diabetes care plan with: ? Your work or school. ? People you live with.  Check your pee (urine) for ketones: ? When you are sick. ? As told by your doctor.  Carry a card or wear jewelry that says you have diabetes. If You Have Low Blood Sugar  From Other Causes:   Check your blood sugar as often as told by your doctor.  Follow instructions from your doctor about what you cannot eat or drink. Contact a doctor if:  You have trouble keeping your blood sugar in your target range.  You have low blood sugar often. Get help right away if:  You still have symptoms after you eat or drink something sugary.  Your blood sugar is at or below 54 mg/dL (3 mmol/L).  You have jerky movements that you cannot control.  You pass out. These symptoms may be an emergency. Do not wait to see if the symptoms will go away. Get medical help right away. Call your local  emergency services (911 in the [place] Do not drive yourself to the hospital. This information is not intended to replace advice given to you by your health care provider. Make sure you discuss any questions you have with your health care provider. Document Released: 02/23/2010 Document Revised: 05/06/2016 Document Reviewed: 01/02/2016 Elsevier Interactive Patient Education  2018 [place] BYPASS/SLEEVE  Home Care Instructions   These instructions are to help you care for yourself when you go home.  Call: If you have any problems.  Call [phone] and ask for the surgeon on call  If you need immediate help, come to the ER at [facility].   Tell the ER staff that you are a new post-op gastric bypass or gastric sleeve patient   Signs and symptoms to report:  Severe vomiting or nausea o If you cannot keep down clear liquids for longer than 1 day, call your surgeon   Abdominal pain that does not get better after taking your pain medication  Fever over 100.4 F with chills  Heart beating over 100 beats a minute  Shortness of breath at rest  Chest pain   Redness, swelling, drainage, or foul odor at incision (surgical) sites   If your incisions open or pull apart  Swelling or pain in calf (lower leg)  Diarrhea (Loose bowel movements that happen often), frequent watery, uncontrolled bowel movements  Constipation, (no bowel movements for 3 days) if this happens: Pick one o Milk of Magnesia, 2 tablespoons by mouth, 3 times a day for 2 days if needed o Stop taking Milk of Magnesia once you have a bowel movement o Call your doctor if constipation continues Or o Miralax  (instead of Milk of Magnesia) following the label instructions o Stop taking Miralax once you have a bowel movement o Call your doctor if constipation continues  Anything you think is not normal   Normal side effects after surgery:  Unable to sleep at night or unable to  focus  Irritability or moody  Being tearful (crying) or depressed These are common complaints, possibly related to your anesthesia medications that put you to sleep, stress of surgery, and change in lifestyle.  This usually goes away a few weeks after surgery.  If these feelings continue, call your primary care doctor.   Wound Care: You may have surgical glue, steri-strips, or staples over your incisions after surgery  Surgical glue:  Looks like a clear film over your incisions and will wear off a little at a time  Steri-strips: Strips of tape over your incisions. You may notice a yellowish color on the skin under the steri-strips. This is used to make the   steri-strips stick better. Do not pull the steri-strips off - let them fall off  Staples: Staples may  be removed before you leave the hospital o If you go home with staples, call [facility], [phone]) [phone] at for an appointment with your surgeons nurse to have staples removed 10 days after surgery.  Showering: You may shower two (2) days after your surgery unless your surgeon tells you differently o Wash gently around incisions with warm soapy water, rinse well, and gently pat dry  o No tub baths until staples are removed, steri-strips fall off or glue is gone.    Medications:  Medications should be liquid or crushed if larger than the size of a dime  Extended release pills (medication that release a little bit at a time through the day) should NOT be crushed or cut. (examples include XL, ER, DR, SR)  Depending on the size and number of medications you take, you may need to space (take a few throughout the day)/change the time you take your medications so that you do not over-fill your pouch (smaller stomach)  Make sure you follow-up with your primary care doctor to make medication changes needed during rapid weight loss and life-style changes  If you have diabetes, follow up with the doctor that orders your diabetes  medication(s) within one week after surgery and check your blood sugar regularly.  Do not drive while taking prescription pain medication   It is ok to take Tylenol by the bottle instructions with your pain medicine or instead of your pain medicine as needed.  DO NOT TAKE NSAIDS (EXAMPLES OF NSAIDS:  IBUPROFREN/ NAPROXEN)  Diet:                    First 2 Weeks  You will see the dietician t about two (2) weeks after your surgery. The dietician will increase the types of foods you can eat if you are handling liquids well:  If you have severe vomiting or nausea and cannot keep down clear liquids lasting longer than 1 day, call your surgeon @ [phone]) Protein Shake  Drink at least 2 ounces of shake 5-6 times per day  Each serving of protein shakes (usually 8 - 12 ounces) should have: o 15 grams of protein  o And no more than 5 grams of carbohydrate   Goal for protein each day: o Men = 80 grams per day o Women = 60 grams per day  Protein powder may be added to fluids such as non-fat milk or Lactaid milk or unsweetened Soy/Almond milk (limit to 35 grams added protein powder per serving)  Hydration  Slowly increase the amount of water and other clear liquids as tolerated (See Acceptable Fluids)  Slowly increase the amount of protein shake as tolerated    Sip fluids slowly and throughout the day.  Do not use straws.  May use sugar substitutes in small amounts (no more than 6 - 8 packets per day; i.e. Splenda)  Fluid Goal  The first goal is to drink at least 8 ounces of protein shake/drink per day (or as directed by the nutritionist); some examples of protein shakes are [employer] Johnson, [employer]R [employer], EAS Edge HP, and [place]jury. See handout from pre-op Bariatric Education Class: o Slowly increase the amount of protein shake you drink as tolerated o You may find it easier to slowly sip shakes throughout the day o It is important to get your proteins in first  Your fluid goal is to  drink 64 - 100 ounces of fluid daily o It may take a few weeks to build  up to this  32 oz (or more) should be clear liquids  And   32 oz (or more) should be full liquids (see below for examples)  Liquids should not contain sugar, caffeine, or carbonation  Clear Liquids:  Water or Sugar-free flavored water (i.e. Fruit H2O, Propel)  Decaffeinated coffee or tea (sugar-free)  Crystal Lite, Wylers Lite, Minute Maid Lite  Sugar-free Jell-O  Bouillon or broth  Sugar-free Popsicle:   *Less than 20 calories each; Limit 1 per day  Full Liquids: Protein Shakes/Drinks + 2 choices per day of other full liquids  Full liquids must be: o No More Than 15 grams of Carbs per serving  o No More Than 3 grams of Fat per serving  Strained low-fat cream soup (except Cream of Potato or Tomato)  Non-Fat milk  Fat-free Lactaid Milk  Unsweetened Soy Or Unsweetened Almond Milk  Low Sugar yogurt (Dannon Lite & Fit, Greek yogurt; Oikos Triple Zero; Chobani Simply 100; Yoplait 100 calorie [place] - No Fruit on the Bottom)    Vitamins and Minerals  Start 1 day after surgery unless otherwise directed by your surgeon  2 Chewable Bariatric Specific Multivitamin / Multimineral Supplement with iron (Example: Bariatric Advantage Multi EA)  Chewable Calcium with Vitamin D-3 (Example: 3 Chewable Calcium Plus 600 with Vitamin D-3) o Take 500 mg three (3) times a day for a total of 1500 mg each day o Do not take all 3 doses of calcium at one time as it may cause constipation, and you can only absorb 500 mg  at a time  o Do not mix multivitamins containing iron with calcium supplements; take 2 hours apart  Menstruating women and those with a history of anemia (a blood disease that causes weakness) may need extra iron o Talk with your doctor to see if you need more iron  Do not stop taking or change any vitamins or minerals until you talk to your dietitian or surgeon  Your Dietitian and/or surgeon must  approve all vitamin and mineral supplements   Activity and Exercise: Limit your physical activity as instructed by your doctor.  It is important to continue walking at home.  During this time, use these guidelines:  Do not lift anything greater than ten (10) pounds for at least two (2) weeks  Can return to work in 3 weeks from surgery date  You may have sex when you feel comfortable  o It is VERY important for male patients to use a reliable birth control method; fertility often increases after surgery  o All hormonal birth control will be ineffective for 30 days after surgery due to medications given during surgery a barrier method must be used. o Do not get pregnant for at least 18 months  Start exercising as soon as your doctor tells you that you can o Make sure your doctor approves any physical activity  Start with a simple walking program  Walk 5-15 minutes each day, 7 days per week.   Slowly increase until you are walking 30-45 minutes per day Consider joining our [place] program. [phone] or email [email or web address]   Special Instructions Things to remember:  Use your CPAP when sleeping if this applies to you   [facility] has two free Bariatric Surgery Support Groups that meet monthly o The 3rd Thursday of each month, 6 pm, [facility]  o The 2nd Friday of each month, 11:45 am in the private dining room in the basement of [facility]  It is very important to keep all follow up appointments with your surgeon, dietitian, primary care physician, and behavioral health practitioner  Routine follow up schedule with your surgeon include appointments at 2-3 weeks, 6-8 weeks, 6 months, and 1 year at a minimum.  Your surgeon may request to see you more often.   o After the first year, please follow up with your bariatric surgeon and dietitian at least once a year in order to maintain best weight loss results [facility]:  [place]: [phone] Bariatric Nurse Coordinator: [phone]      Reviewed and Endorsed  by [facility] Patient Education Committee, June, 2016 Edits Approved: Aug, 2018

## 2018-05-05 NOTE — Progress Notes (Signed)
Discharge instructions discussed with patient, verbalized agreement and understanding 

## 2018-05-05 NOTE — Progress Notes (Signed)
Date and time results received: 05/05/18    Test: CBG  Critical Value:CBG 63  Name of Provider Notified: Autumn Messing, MD  Orders Received? Or Actions Taken?: change IV fluids to D5 NS with 20K @ 100.

## 2018-05-09 ENCOUNTER — Telehealth (HOSPITAL_COMMUNITY): Payer: Self-pay

## 2018-05-09 NOTE — Telephone Encounter (Signed)
Patient called to discuss post bariatric surgery follow up questions.  See below:   1.  Tell me about your pain and pain management?has not had any pain since surgery  2.  Let's talk about fluid intake.  How much total fluid are you taking in?47 ounces of fluid yesterday, had powerade zero yesterday  3.  How much protein have you taken in the last 2 days?30-45 grams, discussed increasing protein intake  4.  Have you had nausea?  Tell me about when have experienced nausea and what you did to help?no nausea  5.  Has the frequency or color changed with your urine?frequent urination, light in color  6.  Tell me what your incisions look like?no problems  7.  Have you been passing gas? BM?yes no problems  8.  If a problem or question were to arise who would you call?  Do you know contact numbers for [place], [facility], and [facility]?aware of how to contact all services  9.  How has the walking going?walking frequently  10.  How are your vitamins and calcium going?  How are you taking them?reeduated to take mvi twice per day.  Taking only one time per day, calcium three times per day  Patient to see pcp this week.  Stated his blood sugars are around 100 with no medication.  Also has had NO bleeding since discharge.  Made aware to call [facility] if any symptoms of dehydration occur including dizziness, headaches, decreased urinary output

## 2018-05-11 ENCOUNTER — Encounter: Payer: Self-pay | Admitting: Internal Medicine

## 2018-05-11 ENCOUNTER — Ambulatory Visit (INDEPENDENT_AMBULATORY_CARE_PROVIDER_SITE_OTHER): Payer: BLUE CROSS/BLUE SHIELD | Admitting: Internal Medicine

## 2018-05-11 VITALS — BP 110/64 | HR 83 | Temp 98.4°F | Resp 16 | Ht 69.0 in | Wt 357.5 lb

## 2018-05-11 DIAGNOSIS — I10 Essential (primary) hypertension: Secondary | ICD-10-CM

## 2018-05-11 DIAGNOSIS — E118 Type 2 diabetes mellitus with unspecified complications: Secondary | ICD-10-CM

## 2018-05-11 NOTE — Patient Instructions (Signed)

## 2018-05-11 NOTE — Progress Notes (Signed)
Subjective:  Patient ID: Joseph Wood, male    DOB: 08/14/1971  Age: 47 y.o. MRN: [id number]  CC: Hypertension and Diabetes   HPI Jakorey J Wixon presents for f/up - He is about 3 weeks status post Roux-en-Y gastric bypass.  He tells me he is doing well with no episodes of abdominal pain, nausea, or vomiting.  He does complain of lightheadedness and weakness and says his blood pressure and blood sugars are getting low.  He has lost over 40 pounds prior to the surgery and since the surgery.  Outpatient Medications Prior to Visit  Medication Sig Dispense Refill  . Blood Glucose Monitoring Suppl (BAYER CONTOUR NEXT MONITOR) w/Device KIT Use to check blood sugar DX E11.8 1 kit 0  . calcium-vitamin D (OSCAL WITH D) 500-200 MG-UNIT tablet Take 1 tablet by mouth 3 (three) times daily.    . Lancets (ONETOUCH ULTRASOFT) lancets Use to test blood sugar tid. DX E11.9 300 each 3  . Multiple Vitamins-Minerals (BARIATRIC MULTIVITAMINS/IRON PO) Take 1 tablet by mouth daily.    Marland Kitchen omeprazole (PRILOSEC) 20 MG capsule TAKE ONE CAPSULE BY MOUTH ONCE DAILY (Patient taking differently: TAKE ONE CAPSULE BY MOUTH ONCE DAILY-- takes in am) 30 capsule 11  . ONETOUCH VERIO test strip TEST UP TO THREE TIMES DAILY 300 each 3  . aspirin EC 81 MG tablet Take 1 tablet (81 mg total) by mouth daily. Wait 1 week before resuming 90 tablet 3  . atorvastatin (LIPITOR) 10 MG tablet Take 1 tablet (10 mg total) by mouth daily. Office visit needed for additional refills 30 tablet 0  . glucose blood (BAYER CONTOUR NEXT TEST) test strip Use as instructed DX E11.8 100 each 4  . metFORMIN (GLUCOPHAGE) 500 MG tablet TAKE 1 TABLET BY MOUTH TWICE DAILY WITH A MEAL (Patient taking differently: TAKE 1 TABLET BY MOUTH ONCE A DAY WITH A MEAL) 180 tablet 1  . ondansetron (ZOFRAN) 4 MG tablet Take 4 mg by mouth every 6 (six) hours as needed for nausea or vomiting.    Marland Kitchen oxyCODONE (ROXICODONE) 5 MG/5ML solution Take 5-10 mg by mouth every 4  (four) hours as needed (pain).    Marland Kitchen telmisartan (MICARDIS) 80 MG tablet TAKE 1 TABLET BY MOUTH ONCE DAILY (Patient taking differently: TAKE 1 TABLET BY MOUTH ONCE DAILY-- takes in am) 90 tablet 0   No facility-administered medications prior to visit.     ROS Review of Systems  Constitutional: Negative.  Negative for diaphoresis, fatigue and unexpected weight change.  HENT: Negative.  Negative for trouble swallowing.   Eyes: Negative.   Respiratory: Negative.  Negative for cough, chest tightness, shortness of breath and wheezing.   Cardiovascular: Negative for chest pain, palpitations and leg swelling.  Gastrointestinal: Negative for abdominal pain, constipation, diarrhea, nausea and vomiting.  Endocrine: Negative.   Genitourinary: Negative.  Negative for difficulty urinating.  Musculoskeletal: Negative.  Negative for arthralgias and myalgias.  Skin: Negative.  Negative for color change and pallor.  Neurological: Positive for dizziness and light-headedness. Negative for speech difficulty and weakness.  Hematological: Negative for adenopathy. Does not bruise/bleed easily.  Psychiatric/Behavioral: Negative.     Objective:  BP 110/64 (BP Location: Left Arm, Patient Position: Sitting, Cuff Size: Large)   Pulse 83   Temp 98.4 F (36.9 C) (Oral)   Resp 16   Ht 5' 9"  (1.753 m)   Wt (!) 357 lb 8 oz (162.2 kg)   SpO2 95%   BMI 52.79 kg/m   BP  Readings from Last 3 Encounters:  05/11/18 110/64  05/05/18 127/73  04/24/18 129/72    Wt Readings from Last 3 Encounters:  05/11/18 (!) 357 lb 8 oz (162.2 kg)  05/05/18 (!) 375 lb 3.2 oz (170.2 kg)  04/17/18 (!) 378 lb 9.6 oz (171.7 kg)    Physical Exam  Constitutional: He is oriented to person, place, and time. No distress.  HENT:  Mouth/Throat: Oropharynx is clear and moist. No oropharyngeal exudate.  Eyes: Conjunctivae are normal. No scleral icterus.  Neck: Normal range of motion. Neck supple. No JVD present. No thyromegaly present.   Cardiovascular: Normal rate, regular rhythm and normal heart sounds.  No murmur heard. Pulmonary/Chest: Effort normal and breath sounds normal. No respiratory distress. He has no wheezes. He has no rales.  Abdominal: Soft. Bowel sounds are normal. He exhibits no distension and no mass.  Musculoskeletal: Normal range of motion. He exhibits no edema or deformity.  Neurological: He is alert and oriented to person, place, and time.  Skin: Skin is warm and dry. He is not diaphoretic.  Psychiatric: He has a normal mood and affect. His behavior is normal. Judgment and thought content normal.  Vitals reviewed.   Lab Results  Component Value Date   WBC 6.5 05/05/2018   HGB 11.6 (L) 05/05/2018   HCT 34.6 (L) 05/05/2018   PLT 216 05/05/2018   GLUCOSE 106 (H) 04/24/2018   CHOL 141 08/01/2017   TRIG 81.0 08/01/2017   HDL 50.70 08/01/2017   LDLCALC 74 08/01/2017   ALT 27 04/24/2018   AST 23 04/24/2018   NA 138 04/24/2018   K 4.6 04/24/2018   CL 104 04/24/2018   CREATININE 1.04 04/24/2018   BUN 19 04/24/2018   CO2 25 04/24/2018   TSH 2.45 08/01/2017   PSA 1.01 06/23/2015   HGBA1C 6.1 (H) 04/24/2018   MICROALBUR <0.7 08/01/2017    No results found.  Assessment & Plan:   Mihcael was seen today for hypertension and diabetes.  Diagnoses and all orders for this visit:  Type 2 diabetes mellitus with complication, without long-term current use of insulin ([place])- His recent A1c was down to 6.1%.  I recommend that he stopped taking metformin. -     Ambulatory referral to Ophthalmology  HYPERTENSION, BENIGN ESSENTIAL- His blood pressure is low and he is symptomatic.  I have asked him to stop taking losartan.   I have discontinued [place] J. Starkes's glucose blood, metFORMIN, telmisartan, oxyCODONE, ondansetron, atorvastatin, and aspirin EC. I am also having him maintain his BAYER CONTOUR NEXT MONITOR, ONETOUCH VERIO, onetouch ultrasoft, omeprazole, Multiple Vitamins-Minerals (BARIATRIC  MULTIVITAMINS/IRON PO), and calcium-vitamin D.  No orders of the defined types were placed in this encounter.    Follow-up: Return in about 4 months (around 09/11/2018).  Scarlette Calico, MD

## 2018-05-16 ENCOUNTER — Encounter: Payer: BLUE CROSS/BLUE SHIELD | Attending: General Surgery | Admitting: Skilled Nursing Facility1

## 2018-05-16 DIAGNOSIS — Z6841 Body Mass Index (BMI) 40.0 and over, adult: Secondary | ICD-10-CM | POA: Diagnosis not present

## 2018-05-16 DIAGNOSIS — Z713 Dietary counseling and surveillance: Secondary | ICD-10-CM | POA: Insufficient documentation

## 2018-05-17 ENCOUNTER — Encounter: Payer: Self-pay | Admitting: Skilled Nursing Facility1

## 2018-05-17 NOTE — Progress Notes (Signed)
Bariatric Class:  Appt start time: 1530 end time:  1630.  2 Week Post-Operative Nutrition Class  Patient was seen on 05/16/2018 for Post[facility]ative Nutrition [facility]   Surgery date: 05/02/2018 Surgery type: RYGB Start weight at [facility]: 407.9 Weight today: 378.6  TANITA  BODY COMP RESULTS  05/17/2018   BMI (kg/m^2) 52.2   Fat Mass (lbs) 172.4   Fat Free Mass (lbs) 181.4   Total Body Water (lbs) N/A   The following the learning objectives were met by the patient during this course:  Identifies Phase 3A (Soft, High Proteins) Dietary Goals and will begin from 2 weeks post-operatively to 2 months post-operatively  Identifies appropriate sources of fluids and proteins   States protein recommendations and appropriate sources post-operatively  Identifies the need for appropriate texture modifications, mastication, and bite sizes when consuming solids  Identifies appropriate multivitamin and calcium sources post-operatively  Describes the need for physical activity post-operatively and will follow MD recommendations  States when to call healthcare provider regarding medication questions or post-operative complications  Handouts given during class include:  Phase 3A: Soft, High Protein Diet Handout  Follow-Up Plan: Patient will follow-up at [facility] in 6 weeks for 2 month post-op nutrition visit for diet advancement per MD.

## 2018-05-18 ENCOUNTER — Encounter: Payer: Self-pay | Admitting: Family Medicine

## 2018-05-22 ENCOUNTER — Ambulatory Visit (INDEPENDENT_AMBULATORY_CARE_PROVIDER_SITE_OTHER): Payer: BLUE CROSS/BLUE SHIELD | Admitting: Family Medicine

## 2018-05-22 ENCOUNTER — Ambulatory Visit (INDEPENDENT_AMBULATORY_CARE_PROVIDER_SITE_OTHER): Payer: BLUE CROSS/BLUE SHIELD

## 2018-05-22 ENCOUNTER — Encounter: Payer: Self-pay | Admitting: Family Medicine

## 2018-05-22 VITALS — BP 124/68 | HR 71 | Ht 69.0 in | Wt 352.0 lb

## 2018-05-22 DIAGNOSIS — M79672 Pain in left foot: Secondary | ICD-10-CM | POA: Diagnosis not present

## 2018-05-22 DIAGNOSIS — E119 Type 2 diabetes mellitus without complications: Secondary | ICD-10-CM | POA: Diagnosis not present

## 2018-05-22 DIAGNOSIS — H40033 Anatomical narrow angle, bilateral: Secondary | ICD-10-CM | POA: Diagnosis not present

## 2018-05-22 MED ORDER — DICLOFENAC SODIUM 2 % TD SOLN: 1.0000 "application " | Freq: Two times a day (BID) | TRANSDERMAL | 3 refills | Status: DC

## 2018-05-22 NOTE — Patient Instructions (Signed)
Good to see you  Please try the exercises  Please follow up with me to have orthotics made.

## 2018-05-22 NOTE — Progress Notes (Signed)
Joseph Wood - 47 y.o. male MRN [id number]  Date of birth: 06-05-1971  SUBJECTIVE:  Including CC & ROS.  Chief Complaint  Patient presents with  . Left foot pain    Joseph Wood is a 47 y.o. male that is presenting with left ankle pain. He recently had gastric bypass on 05/02/18. He received an injection for plantar fascitis on 10/26/17, he states the pain has improved. Acute on chronic pain today in his left lateral malleolus and extends upward to his midfoot. He feels a pop when he is move active and walking. He has been increasing his exercise and the pain has worsened. Denies swelling. Denis injury. He stands majority throughout the day for his job. Pain is worse when standing. He has not been taking anything for the pain. Denies any recent injury. The pain is intermittent.     Review of Systems  Constitutional: Negative for fever.  HENT: Negative for congestion.   Respiratory: Negative for cough.   Cardiovascular: Negative for chest pain.  Gastrointestinal: Negative for abdominal distention.  Musculoskeletal: Positive for gait problem.  Skin: Negative for color change.  Neurological: Negative for numbness.  Hematological: Negative for adenopathy.  Psychiatric/Behavioral: Negative for agitation.    HISTORY: Past Medical, Surgical, Social, and Family History Reviewed & Updated per EMR.   Pertinent Historical Findings include:  Past Medical History:  Diagnosis Date  . Arthritis   . GERD (gastroesophageal reflux disease)   . Hypertension   . Obesity   . OSA on CPAP   . Type 2 diabetes mellitus ([place])    followed by pcp  . Wears partial dentures    lower    Past Surgical History:  Procedure Laterality Date  . COLONOSCOPY WITH PROPOFOL  11/06/2012   Procedure: COLONOSCOPY WITH PROPOFOL;  Surgeon: Jerene Bears, MD;  Location: [facility];  Service: Gastroenterology;  Laterality: N/A;  Andrea/  . GASTRIC ROUX-EN-Y N/A 05/02/2018   Procedure: LAPAROSCOPIC ROUX-EN-Y  GASTRIC BYPASS WITH UPPER ENDOSCOPY;  Surgeon: Excell Seltzer, MD;  Location: [facility];  Service: General;  Laterality: N/A;  . KNEE ARTHROSCOPY W/ ACL RECONSTRUCTION Right 11-24-2009   dr dean    Allergies  Allergen Reactions  . Benazepril Hcl Cough    Severe cough  . Tramadol Other (See Comments)    Severe dizziness    Family History  Problem Relation Age of Onset  . Arthritis Mother   . Hypertension Mother   . Breast cancer Mother   . Diabetes Mother   . Aneurysm Father        aortic  . Prostate cancer Father   . Hypertension Father   . Diabetes Father   . Hypertension Sister   . Rheum arthritis Sister   . Hypertension Brother   . Cancer Other      Social History   Socioeconomic History  . Marital status: Divorced    Spouse name: Not on file  . Number of children: 1  . Years of education: Not on file  . Highest education level: Not on file  Occupational History  . Occupation: Personal assistant: [employer]  Social Needs  . Financial resource strain: Not on file  . Food insecurity:    Worry: Never true    Inability: Never true  . Transportation needs:    Medical: Not on file    Non-medical: Not on file  Tobacco Use  . Smoking status: Former Smoker    Types: Cigarettes, Cigars  Last attempt to quit: 04/24/1989    Years since quitting: 29.0  . Smokeless tobacco: Never Used  . Tobacco comment: occaional cigars  Substance and Sexual Activity  . Alcohol use: No  . Drug use: No  . Sexual activity: Yes  Lifestyle  . Physical activity:    Days per week: Not on file    Minutes per session: Not on file  . Stress: Not on file  Relationships  . Social connections:    Talks on phone: Not on file    Gets together: Not on file    Attends religious service: Not on file    Active member of club or organization: Not on file    Attends meetings of clubs or organizations: Not on file    Relationship status: Not on file  . Intimate partner violence:     Fear of current or ex partner: Not on file    Emotionally abused: Not on file    Physically abused: Not on file    Forced sexual activity: Not on file  Other Topics Concern  . 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  He has one daughter. Lives in one story home.    Highest level of education: high school   Regular exercise-no     PHYSICAL EXAM:  VS: BP 124/68 (BP Location: Left Arm, Patient Position: Sitting, Cuff Size: Large)   Pulse 71   Ht 5' 9"  (1.753 m)   Wt (!) 352 lb (159.7 kg)   SpO2 97%   BMI 51.98 kg/m  Physical Exam Gen: NAD, alert, cooperative with exam, well-appearing ENT: normal lips, normal nasal mucosa,  Eye: normal EOM, normal conjunctiva and lids CV:  no edema, +2 pedal pulses   Resp: no accessory muscle use, non-labored,  Skin: no rashes, no areas of induration  Neuro: normal tone, normal sensation to touch Psych:  normal insight, alert and oriented MSK:  Left foot:  No callus or ulcer formation  Normal AROM  Weakness with resistance to inversion  Pes planus b/l  Neurovascularly intact.   Limited ultrasound: left ankle:  Posterior tibialis appears to be absent within the tarsal tunnel. Appears to have absent PT at insertion at navicular  Achilles is mildly thickened at midbelly with findings to suggest retrocalcaneal bursitis.     Summary: findings seem consistent with chronic posterior tib tear.   Ultrasound and interpretation by Clearance Coots, MD       ASSESSMENT & PLAN:   Left foot pain Appears to have a chronically torn posterior tib tendon. Has a mildly swollen achilles and appears to be a retrocalcaneal bursitis but no pain posteriorly.  - pennsaid  - counseled on HEP  - would try custom orthotics to help support.

## 2018-05-22 NOTE — Assessment & Plan Note (Signed)
Appears to have a chronically torn posterior tib tendon. Has a mildly swollen achilles and appears to be a retrocalcaneal bursitis but no pain posteriorly.  - pennsaid  - counseled on HEP  - would try custom orthotics to help support.

## 2018-05-23 ENCOUNTER — Ambulatory Visit (INDEPENDENT_AMBULATORY_CARE_PROVIDER_SITE_OTHER): Payer: BLUE CROSS/BLUE SHIELD | Admitting: Family Medicine

## 2018-05-23 ENCOUNTER — Encounter: Payer: Self-pay | Admitting: Family Medicine

## 2018-05-23 DIAGNOSIS — M79672 Pain in left foot: Secondary | ICD-10-CM | POA: Diagnosis not present

## 2018-05-23 NOTE — Progress Notes (Signed)
Joseph Wood - 47 y.o. male MRN [id number]  Date of birth: 08/21/1971  SUBJECTIVE:  Including CC & ROS.  Chief Complaint  Patient presents with  . Foot Orthotics    Joseph Wood is a 47 y.o. male that is here today for orthotics. Denies changes in his history.    Review of Systems  HISTORY: Past Medical, Surgical, Social, and Family History Reviewed & Updated per EMR.   Pertinent Historical Findings include:  Past Medical History:  Diagnosis Date  . Arthritis   . GERD (gastroesophageal reflux disease)   . Hypertension   . Obesity   . OSA on CPAP   . Type 2 diabetes mellitus ([place])    followed by pcp  . Wears partial dentures    lower    Past Surgical History:  Procedure Laterality Date  . COLONOSCOPY WITH PROPOFOL  11/06/2012   Procedure: COLONOSCOPY WITH PROPOFOL;  Surgeon: Jerene Bears, MD;  Location: [facility];  Service: Gastroenterology;  Laterality: N/A;  Andrea/  . GASTRIC ROUX-EN-Y N/A 05/02/2018   Procedure: LAPAROSCOPIC ROUX-EN-Y GASTRIC BYPASS WITH UPPER ENDOSCOPY;  Surgeon: Excell Seltzer, MD;  Location: [facility];  Service: General;  Laterality: N/A;  . KNEE ARTHROSCOPY W/ ACL RECONSTRUCTION Right 11-24-2009   dr dean    Allergies  Allergen Reactions  . Benazepril Hcl Cough    Severe cough  . Tramadol Other (See Comments)    Severe dizziness    Family History  Problem Relation Age of Onset  . Arthritis Mother   . Hypertension Mother   . Breast cancer Mother   . Diabetes Mother   . Aneurysm Father        aortic  . Prostate cancer Father   . Hypertension Father   . Diabetes Father   . Hypertension Sister   . Rheum arthritis Sister   . Hypertension Brother   . Cancer Other      Social History   Socioeconomic History  . Marital status: Divorced    Spouse name: Not on file  . Number of children: 1  . Years of education: Not on file  . Highest education level: Not on file  Occupational History  . Occupation: Lobbyist: [employer]  Social Needs  . Financial resource strain: Not on file  . Food insecurity:    Worry: Never true    Inability: Never true  . Transportation needs:    Medical: Not on file    Non-medical: Not on file  Tobacco Use  . Smoking status: Former Smoker    Types: Cigarettes, Cigars    Last attempt to quit: 04/24/1989    Years since quitting: 29.0  . Smokeless tobacco: Never Used  . Tobacco comment: occaional cigars  Substance and Sexual Activity  . Alcohol use: No  . Drug use: No  . Sexual activity: Yes  Lifestyle  . Physical activity:    Days per week: Not on file    Minutes per session: Not on file  . Stress: Not on file  Relationships  . Social connections:    Talks on phone: Not on file    Gets together: Not on file    Attends religious service: Not on file    Active member of club or organization: Not on file    Attends meetings of clubs or organizations: Not on file    Relationship status: Not on file  . Intimate partner violence:    Fear of current or ex partner:  Not on file    Emotionally abused: Not on file    Physically abused: Not on file    Forced sexual activity: Not on file  Other Topics Concern  . 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  He has one daughter. Lives in one story home.    Highest level of education: high school   Regular exercise-no     PHYSICAL EXAM:  VS: There were no vitals taken for this visit. Physical Exam Gen: NAD, alert, cooperative with exam, well-appearing      ASSESSMENT & PLAN:   Left foot pain Has a tear of post tib. Will try to support this with orthotic   Patient was fitted for a standard, cushioned, semi-rigid orthotic. The orthotic was heated and afterward the patient stood on the orthotic blank positioned on the orthotic stand. The patient was positioned in subtalar neutral  position and 10 degrees of ankle dorsiflexion in a weight bearing stance. After completion of molding, a stable base was applied to the orthotic blank. The blank was ground to a stable position for weight bearing. Size: 14 Base: cork Additional Posting and Padding: None The patient ambulated these, and they were very comfortable.

## 2018-05-23 NOTE — Assessment & Plan Note (Signed)
Has a tear of post tib. Will try to support this with orthotic   Patient was fitted for a standard, cushioned, semi-rigid orthotic. The orthotic was heated and afterward the patient stood on the orthotic blank positioned on the orthotic stand. The patient was positioned in subtalar neutral position and 10 degrees of ankle dorsiflexion in a weight bearing stance. After completion of molding, a stable base was applied to the orthotic blank. The blank was ground to a stable position for weight bearing. Size: 14 Base: cork Additional Posting and Padding: None The patient ambulated these, and they were very comfortable.

## 2018-05-26 ENCOUNTER — Telehealth: Payer: Self-pay | Admitting: Registered"

## 2018-05-26 NOTE — Telephone Encounter (Signed)
RD called pt to verify fluid intake once starting soft, solid proteins 2 week post-bariatric surgery.   Daily Fluid intake: 64+ ounces Daily Protein intake: 80+ grams  Concerns/issues: None stated

## 2018-05-30 ENCOUNTER — Telehealth: Payer: Self-pay | Admitting: Registered"

## 2018-05-30 NOTE — Telephone Encounter (Signed)
Pt called to see if he could take tylenol for his headache. I asked Ubaldo Glassing the dietician if this was okay. She advised that it is okay as long as the Psychologist, sport and exercise or PCP has not told him otherwise and he should consult with the surgeon. He voiced understanding.

## 2018-06-05 ENCOUNTER — Ambulatory Visit: Payer: Self-pay | Admitting: Family Medicine

## 2018-06-05 NOTE — Progress Notes (Deleted)
Joseph Wood - 47 y.o. male MRN [id number]  Date of birth: [birth year]  SUBJECTIVE:  Including CC & ROS.  No chief complaint on file.   Joseph Wood is a 47 y.o. male that is  ***.  ***   Review of Systems  HISTORY: Past Medical, Surgical, Social, and Family History Reviewed & Updated per EMR.   Pertinent Historical Findings include:  Past Medical History:  Diagnosis Date  . Arthritis   . GERD (gastroesophageal reflux disease)   . Hypertension   . Obesity   . OSA on CPAP   . Type 2 diabetes mellitus ([place])    followed by pcp  . Wears partial dentures    lower    Past Surgical History:  Procedure Laterality Date  . COLONOSCOPY WITH PROPOFOL  11/06/2012   Procedure: COLONOSCOPY WITH PROPOFOL;  Surgeon: Jerene Bears, MD;  Location: [facility];  Service: Gastroenterology;  Laterality: N/A;  Andrea/  . GASTRIC ROUX-EN-Y N/A 05/02/2018   Procedure: LAPAROSCOPIC ROUX-EN-Y GASTRIC BYPASS WITH UPPER ENDOSCOPY;  Surgeon: Excell Seltzer, MD;  Location: [facility];  Service: General;  Laterality: N/A;  . KNEE ARTHROSCOPY W/ ACL RECONSTRUCTION Right 11-24-2009   dr dean    Allergies  Allergen Reactions  . Benazepril Hcl Cough    Severe cough  . Tramadol Other (See Comments)    Severe dizziness    Family History  Problem Relation Age of Onset  . Arthritis Mother   . Hypertension Mother   . Breast cancer Mother   . Diabetes Mother   . Aneurysm Father        aortic  . Prostate cancer Father   . Hypertension Father   . Diabetes Father   . Hypertension Sister   . Rheum arthritis Sister   . Hypertension Brother   . Cancer Other      Social History   Socioeconomic History  . Marital status: Divorced    Spouse name: Not on file  . Number of children: 1  . Years of education: Not on file  . Highest education level: Not on file  Occupational History  . Occupation: Personal assistant: [employer]  Social Needs  . Financial resource strain: Not  on file  . Food insecurity:    Worry: Never true    Inability: Never true  . Transportation needs:    Medical: Not on file    Non-medical: Not on file  Tobacco Use  . Smoking status: Former Smoker    Types: Cigarettes, Cigars    Last attempt to quit: 04/24/1989    Years since quitting: 29.1  . Smokeless tobacco: Never Used  . Tobacco comment: occaional cigars  Substance and Sexual Activity  . Alcohol use: No  . Drug use: No  . Sexual activity: Yes  Lifestyle  . Physical activity:    Days per week: Not on file    Minutes per session: Not on file  . Stress: Not on file  Relationships  . Social connections:    Talks on phone: Not on file    Gets together: Not on file    Attends religious service: Not on file    Active member of club or organization: Not on file    Attends meetings of clubs or organizations: Not on file    Relationship status: Not on file  . Intimate partner violence:    Fear of current or ex partner: Not on file    Emotionally abused: Not on  file    Physically abused: Not on file    Forced sexual activity: Not on file  Other Topics Concern  . 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  He has one daughter. Lives in one story home.    Highest level of education: high school   Regular exercise-no     PHYSICAL EXAM:  VS: There were no vitals taken for this visit. Physical Exam Gen: NAD, alert, cooperative with exam, well-appearing ENT: normal lips, normal nasal mucosa,  Eye: normal EOM, normal conjunctiva and lids CV:  no edema, +2 pedal pulses   Resp: no accessory muscle use, non-labored,  GI: no masses or tenderness, no hernia  Skin: no rashes, no areas of induration  Neuro: normal tone, normal sensation to touch Psych:  normal insight, alert and oriented MSK:  ***      ASSESSMENT & PLAN:   No problem-specific Assessment & Plan  notes found for this encounter.

## 2018-06-07 ENCOUNTER — Telehealth: Payer: Self-pay

## 2018-06-07 DIAGNOSIS — M722 Plantar fascial fibromatosis: Secondary | ICD-10-CM

## 2018-06-07 DIAGNOSIS — M79672 Pain in left foot: Secondary | ICD-10-CM

## 2018-06-07 LAB — HM DIABETES EYE EXAM

## 2018-06-07 NOTE — Telephone Encounter (Signed)
Dr. Raeford Razor has seen patient for left foot pain. Referral has been entered as requested.

## 2018-06-07 NOTE — Telephone Encounter (Signed)
Copied from [place] [id number]. Topic: Referral - Request >> Jun 06, 2018  9:52 AM Hewitt Shorts wrote: Pt is still having foot and ankle pain and would ike a referral to [facility] -dr Paulla Dolly   Best number [phone]

## 2018-06-13 ENCOUNTER — Ambulatory Visit: Payer: Self-pay | Admitting: Psychiatry

## 2018-06-26 ENCOUNTER — Encounter: Payer: BLUE CROSS/BLUE SHIELD | Attending: General Surgery | Admitting: Registered"

## 2018-06-26 ENCOUNTER — Encounter: Payer: Self-pay | Admitting: Registered"

## 2018-06-26 DIAGNOSIS — E119 Type 2 diabetes mellitus without complications: Secondary | ICD-10-CM

## 2018-06-26 DIAGNOSIS — Z713 Dietary counseling and surveillance: Secondary | ICD-10-CM | POA: Diagnosis not present

## 2018-06-26 DIAGNOSIS — Z6841 Body Mass Index (BMI) 40.0 and over, adult: Secondary | ICD-10-CM | POA: Diagnosis not present

## 2018-06-26 NOTE — Progress Notes (Signed)
Follow-up visit:  8 Weeks Post-Operative RYGB Surgery  Medical Nutrition Therapy:  Appt start time: 11:55 end time:  12:26.  Primary concerns today: Post-operative Bariatric Surgery Nutrition Management.  Non scale victories: no longer taking any medications (bp, cholesterol, diabetes, heartburn), sleeping better, increased mobility, bend over better, get up better, less sweating  Surgery date: 05/02/2018 Surgery type: RYGB Start weight at [facility]: 407.9 Weight today: 334.5 Weight change: 44.1 lbs from 378.6 (05/17/2018) Total weight lost: 73.4 lbs Weight loss goal: to be healthy, able to get around and do more with family and daughter, improve foot pain/preventing foot surgery, fish like he used to  Lead RESULTS  05/17/2018 06/26/2018   BMI (kg/m^2) 52.2 Pt used regular scale   Fat Mass (lbs) 172.4    Fat Free Mass (lbs) 181.4    Total Body Water (lbs) N/A     Pt states he does not like multivitamins. Pt states he recently had death in his family causing him to get off schedule with physical activity but plans to begin going to the gym this week. Pt is doing well!   Preferred Learning Style:   No preference indicated   Learning Readiness:   Ready  Change in progress  24-hr recall: B (AM): egg (6g) Snk (AM): 2 oz sliced [place] (70J) L (PM): tuna (15g) Snk (PM): sometimes pork rinds (16g) D (PM): 3 oz burger patty or baked chicken (21g)  Snk (PM): sugar-free popsicles  Fluid intake: water, Powerade zero, 2% milk; 60+ ounces Estimated total protein intake: ~72 grams  Medications: See list Supplementation: Bariatric Advantage multi + 3 calcium supplements  CBG monitoring: yes Average CBG per patient: 87-95 (fasting) Last patient reported A1c: 6.1 (04/2018)  Using straws: yes Drinking while eating: sometimes Having you been chewing well: yes Chewing/swallowing difficulties:  no Changes in vision: no Changes to mood/headaches: no Hair loss/Changes to  skin/Changes to nails: no, no, no Any difficulty focusing or concentrating: no Sweating: no Dizziness/Lightheaded: no Palpitations: no  Carbonated beverages: no N/V/D/C/GAS: no, no, no, no, yes with beans Abdominal Pain: no Dumping syndrome: no Last Lap-Band fill: N/A  Recent physical activity:  Swimming/running in water   Progress Towards Goal(s):  In progress.  Handouts given during visit include:  Phase IV: High Protein + NS vegetables   Nutritional Diagnosis:  [place]-3.3 Overweight/obesity related to past poor dietary habits and physical inactivity as evidenced by patient w/ recent RYGB surgery following dietary guidelines for continued weight loss.    Intervention:  Nutrition education and counseling. Pt was educated and counseled on the next phase of bariatric post-op diet. Pt was in agreement with goals listed.  Goals:  Follow Phase 3B: High Protein + Non-Starchy Vegetables  Eat 3-6 small meals/snacks, every 3-5 hrs  Increase lean protein foods to meet 80g goal  Increase fluid intake to 64oz +  Avoid drinking 15 minutes before, during and 30 minutes after eating  Aim for >30 min of physical activity daily  Try roll-ups with lettuce, deli meat, and cheese  Try pork skins as a snack option  Teaching Method Utilized:  Visual Auditory Hands on  Barriers to learning/adherence to lifestyle change: none identified  Demonstrated degree of understanding via:  Teach Back   Monitoring/Evaluation:  Dietary intake, exercise, and body weight. Follow up in 4 months for 6 month post-op visit.

## 2018-06-26 NOTE — Patient Instructions (Addendum)
Goals:  Follow Phase 3B: High Protein + Non-Starchy Vegetables  Eat 3-6 small meals/snacks, every 3-5 hrs  Increase lean protein foods to meet 80g goal  Increase fluid intake to 64oz +  Avoid drinking 15 minutes before, during and 30 minutes after eating  Aim for >30 min of physical activity daily  Try roll-ups with lettuce, deli meat, and cheese  Try pork skins as a snack option

## 2018-06-27 ENCOUNTER — Ambulatory Visit: Payer: Self-pay | Admitting: Psychiatry

## 2018-07-03 ENCOUNTER — Ambulatory Visit: Payer: BLUE CROSS/BLUE SHIELD

## 2018-07-03 ENCOUNTER — Ambulatory Visit (INDEPENDENT_AMBULATORY_CARE_PROVIDER_SITE_OTHER): Payer: BLUE CROSS/BLUE SHIELD | Admitting: Podiatry

## 2018-07-03 ENCOUNTER — Ambulatory Visit (INDEPENDENT_AMBULATORY_CARE_PROVIDER_SITE_OTHER): Payer: BLUE CROSS/BLUE SHIELD

## 2018-07-03 ENCOUNTER — Encounter: Payer: Self-pay | Admitting: Podiatry

## 2018-07-03 VITALS — BP 127/75 | HR 64 | Resp 16 | Ht 69.0 in | Wt 330.0 lb

## 2018-07-03 DIAGNOSIS — M79672 Pain in left foot: Secondary | ICD-10-CM

## 2018-07-03 DIAGNOSIS — M722 Plantar fascial fibromatosis: Secondary | ICD-10-CM

## 2018-07-03 MED ORDER — TRIAMCINOLONE ACETONIDE 10 MG/ML IJ SUSP
10.0000 mg | Freq: Once | INTRAMUSCULAR | Status: AC
  Administered 2018-07-03: 10 mg

## 2018-07-03 NOTE — Progress Notes (Signed)
Subjective:   Patient ID: Joseph Wood, male   DOB: 47 y.o.   MRN: [id number]   HPI Patient presents stating the bottom of both heels are really been bothering him and he has lost 100 pounds and is due to lose another 100 and is trying to be active.  Patient has flat feet and does not smoke and likes to be active   Review of Systems  All other systems reviewed and are negative.       Objective:  Physical Exam  Constitutional: He appears well-developed and well-nourished.  Cardiovascular: Intact distal pulses.  Pulmonary/Chest: Effort normal.  Musculoskeletal: Normal range of motion.  Neurological: He is alert.  Skin: Skin is warm.  Nursing note and vitals reviewed.   Neurovascular status found to be intact muscle strength is adequate range of motion within normal limits with patient found to have significant flatfoot deformity noted bilateral and is noted to have exquisite discomfort in the medial aspect of the heel region bilateral with fluid buildup around the insertion of the tendon into the calcaneus.  Patient is noted to have good digital perfusion is well oriented x3 and has had weight loss surgery done several months ago     Assessment:  Acute plantar fasciitis bilateral with depression of the arch noted bilateral     Plan:  H&P conditions reviewed and today injected the plantar fascial bilateral 3 mg Kenalog 5 mg Xylocaine applied fascial brace bilateral gave instructions on physical therapy and talked about long-term orthotics with patient having an inadequate device currently.  Reappoint for [place] to recheck again in the next several weeks to see the results of conservative treatment  X-rays indicate severe depression of the arch bilateral with plantar spur formation noted

## 2018-07-03 NOTE — Patient Instructions (Signed)

## 2018-07-03 NOTE — Progress Notes (Signed)
   Subjective:    Patient ID: Joseph Wood, male    DOB: 19-Dec-1970, 47 y.o.   MRN: [id number]  HPI    Review of Systems  All other systems reviewed and are negative.      Objective:   Physical Exam        Assessment & Plan:

## 2018-07-24 ENCOUNTER — Ambulatory Visit (INDEPENDENT_AMBULATORY_CARE_PROVIDER_SITE_OTHER): Payer: BLUE CROSS/BLUE SHIELD | Admitting: Podiatry

## 2018-07-24 ENCOUNTER — Encounter: Payer: Self-pay | Admitting: Podiatry

## 2018-07-24 ENCOUNTER — Telehealth: Payer: Self-pay | Admitting: *Deleted

## 2018-07-24 DIAGNOSIS — M779 Enthesopathy, unspecified: Secondary | ICD-10-CM

## 2018-07-24 DIAGNOSIS — M722 Plantar fascial fibromatosis: Secondary | ICD-10-CM

## 2018-07-24 MED ORDER — TRIAMCINOLONE ACETONIDE 10 MG/ML IJ SUSP
10.0000 mg | Freq: Once | INTRAMUSCULAR | Status: AC
  Administered 2018-07-24: 10 mg

## 2018-07-24 NOTE — Telephone Encounter (Signed)
Called patient at [phone] (cell #) to let them know that I should have gave him his night splint before leaving his appointment today. Patient left before I could give him his night splint.  Patient did say Dr. Paulla Dolly mentioned a night splint. Patient will be coming tomorrow after work (after 3:30 pm) to pick up at the front desk.  I told patient I would leave it at the front desk for him to pick up. Patient stated he understood.

## 2018-07-26 NOTE — Progress Notes (Signed)
Subjective:   Patient ID: Joseph Wood, male   DOB: 47 y.o.   MRN: [id number]   HPI Patient presents stating he still having heel pain both feet and is worse when he gets up in the morning he has a lot of pain in the left ankle.  Patient notes flatfoot deformity as part of the pathology he is experiencing   ROS      Objective:  Physical Exam  Neurovascular status intact with patient noted to have significant flatfoot deformity bilateral continued discomfort in the heel left and right with moderate improvement from previous treatment and exquisite discomfort in the sinus tarsi left     Assessment:  Acute capsulitis left with inflammation of the sinus tarsi and plantar fasciitis still present bilateral with pathological flatfoot     Plan:  H&P condition reviewed and today I injected the sinus tarsi left 3 mg Kenalog 5 mg Xylocaine I dispensed a night splint to try to start lifting the arch up and to try to take stress off the plantar heel especially to try to resolve the problems he has a stretching exercises and I scanned for custom orthotics for long-term lifting of the arch.  Patient will be seen back when those are ready and will be reevaluated at that time

## 2018-08-15 ENCOUNTER — Other Ambulatory Visit: Payer: BLUE CROSS/BLUE SHIELD | Admitting: Orthotics

## 2018-08-17 ENCOUNTER — Ambulatory Visit: Payer: BLUE CROSS/BLUE SHIELD | Admitting: Orthotics

## 2018-08-17 DIAGNOSIS — M722 Plantar fascial fibromatosis: Secondary | ICD-10-CM

## 2018-08-17 DIAGNOSIS — M79672 Pain in left foot: Secondary | ICD-10-CM

## 2018-08-17 NOTE — Progress Notes (Signed)
Patient came in today to pick up custom made foot orthotics.  The goals were accomplished and the patient reported no dissatisfaction with said orthotics.  Patient was advised of breakin period and how to report any issues. 

## 2018-08-28 ENCOUNTER — Ambulatory Visit: Payer: BLUE CROSS/BLUE SHIELD | Admitting: Orthotics

## 2018-08-28 DIAGNOSIS — M79672 Pain in left foot: Secondary | ICD-10-CM

## 2018-08-28 DIAGNOSIS — M722 Plantar fascial fibromatosis: Secondary | ICD-10-CM

## 2018-08-28 DIAGNOSIS — M779 Enthesopathy, unspecified: Secondary | ICD-10-CM

## 2018-08-28 NOTE — Progress Notes (Signed)
Trim f/o to fit in size 13 shoe.

## 2018-09-07 ENCOUNTER — Encounter: Payer: Self-pay | Admitting: Internal Medicine

## 2018-09-07 ENCOUNTER — Ambulatory Visit (INDEPENDENT_AMBULATORY_CARE_PROVIDER_SITE_OTHER): Payer: BLUE CROSS/BLUE SHIELD | Admitting: Internal Medicine

## 2018-09-07 ENCOUNTER — Other Ambulatory Visit (INDEPENDENT_AMBULATORY_CARE_PROVIDER_SITE_OTHER): Payer: BLUE CROSS/BLUE SHIELD

## 2018-09-07 VITALS — BP 118/76 | HR 68 | Temp 98.4°F | Ht 69.0 in | Wt 319.0 lb

## 2018-09-07 DIAGNOSIS — D539 Nutritional anemia, unspecified: Secondary | ICD-10-CM

## 2018-09-07 DIAGNOSIS — I10 Essential (primary) hypertension: Secondary | ICD-10-CM

## 2018-09-07 DIAGNOSIS — E118 Type 2 diabetes mellitus with unspecified complications: Secondary | ICD-10-CM

## 2018-09-07 DIAGNOSIS — Z0000 Encounter for general adult medical examination without abnormal findings: Secondary | ICD-10-CM

## 2018-09-07 DIAGNOSIS — R519 Headache, unspecified: Secondary | ICD-10-CM

## 2018-09-07 DIAGNOSIS — R51 Headache: Secondary | ICD-10-CM

## 2018-09-07 DIAGNOSIS — H532 Diplopia: Secondary | ICD-10-CM

## 2018-09-07 DIAGNOSIS — E785 Hyperlipidemia, unspecified: Secondary | ICD-10-CM

## 2018-09-07 LAB — URINALYSIS, ROUTINE W REFLEX MICROSCOPIC
Bilirubin Urine: NEGATIVE
Hgb urine dipstick: NEGATIVE
Ketones, ur: NEGATIVE
Leukocytes, UA: NEGATIVE
Nitrite: NEGATIVE
PH: 5.5 (ref 5.0–8.0)
RBC / HPF: NONE SEEN (ref 0–?)
SPECIFIC GRAVITY, URINE: 1.025 (ref 1.000–1.030)
TOTAL PROTEIN, URINE-UPE24: NEGATIVE
UROBILINOGEN UA: 0.2 (ref 0.0–1.0)
Urine Glucose: NEGATIVE

## 2018-09-07 LAB — BASIC METABOLIC PANEL
BUN: 15 mg/dL (ref 6–23)
CALCIUM: 9.5 mg/dL (ref 8.4–10.5)
CO2: 31 mEq/L (ref 19–32)
Chloride: 104 mEq/L (ref 96–112)
Creatinine, Ser: 0.9 mg/dL (ref 0.40–1.50)
GFR: 96.04 mL/min (ref >60.00)
Glucose, Bld: 84 mg/dL (ref 70–99)
POTASSIUM: 4 meq/L (ref 3.5–5.1)
Sodium: 143 mEq/L (ref 135–145)

## 2018-09-07 LAB — CBC WITH DIFFERENTIAL/PLATELET
BASOS ABS: 0 10*3/uL (ref 0.0–0.1)
Basophils Relative: 0.5 % (ref 0.0–3.0)
EOS ABS: 0.1 10*3/uL (ref 0.0–0.7)
EOS PCT: 1.7 % (ref 0.0–5.0)
HCT: 45.1 % (ref 39.0–52.0)
HEMOGLOBIN: 14.9 g/dL (ref 13.0–17.0)
LYMPHS ABS: 2 10*3/uL (ref 0.7–4.0)
Lymphocytes Relative: 28.3 % (ref 12.0–46.0)
MCHC: 33 g/dL (ref 30.0–36.0)
MCV: 88.5 fl (ref 78.0–100.0)
MONO ABS: 0.5 10*3/uL (ref 0.1–1.0)
Monocytes Relative: 6.4 % (ref 3.0–12.0)
NEUTROS PCT: 63.1 % (ref 43.0–77.0)
Neutro Abs: 4.5 10*3/uL (ref 1.4–7.7)
Platelets: 264 10*3/uL (ref 150.0–400.0)
RBC: 5.1 Mil/uL (ref 4.22–5.81)
RDW: 14.8 % (ref 11.5–15.5)
WBC: 7.1 10*3/uL (ref 4.0–10.5)

## 2018-09-07 LAB — LIPID PANEL
CHOLESTEROL: 153 mg/dL (ref 0–200)
HDL: 48.7 mg/dL (ref >39.00)
LDL Cholesterol: 90 mg/dL (ref 0–99)
NonHDL: 104.66
TRIGLYCERIDES: 72 mg/dL (ref 0.0–149.0)
Total CHOL/HDL Ratio: 3
VLDL: 14.4 mg/dL (ref 0.0–40.0)

## 2018-09-07 LAB — FOLATE

## 2018-09-07 LAB — MICROALBUMIN / CREATININE URINE RATIO
CREATININE, U: 166.1 mg/dL
Microalb Creat Ratio: 0.4 mg/g (ref 0.0–30.0)
Microalb, Ur: 0.7 mg/dL (ref 0.0–1.9)

## 2018-09-07 LAB — IBC PANEL
IRON: 67 ug/dL (ref 42–165)
SATURATION RATIOS: 19.1 % — AB (ref 20.0–50.0)
TRANSFERRIN: 251 mg/dL (ref 212.0–360.0)

## 2018-09-07 LAB — VITAMIN B12: Vitamin B-12: 604 pg/mL (ref 211–911)

## 2018-09-07 LAB — FERRITIN: FERRITIN: 31.6 ng/mL (ref 22.0–322.0)

## 2018-09-07 LAB — HEMOGLOBIN A1C: HEMOGLOBIN A1C: 5.6 % (ref 4.6–6.5)

## 2018-09-07 LAB — SEDIMENTATION RATE: SED RATE: 36 mm/h — AB (ref 0–15)

## 2018-09-07 LAB — PSA: PSA: 0.66 ng/mL (ref 0.10–4.00)

## 2018-09-07 NOTE — Patient Instructions (Signed)

## 2018-09-07 NOTE — Progress Notes (Addendum)
Subjective:  Patient ID: Joseph Wood, male    DOB: Aug 17, 1971  Age: 47 y.o. MRN: [id number]  CC: Diabetes; Hypertension; Anemia; Headache; and Annual Exam   HPI Joseph Wood presents for a CPX.  He complains of a several month history of intermittent, diffuse headaches and difficulty with his vision.  He describes the visual disturbance as both blurred vision and double vision.  He tells me an optometrist told him his eye exam was normal about 3 months ago.  His last episode of visual disturbance was about 2 weeks ago.  His vision has been normal since then.  He denies nausea, vomiting, ataxia, or paresthesias.  He is status post bariatric surgery and tells me he is doing well.  He continues to lose weight.  His blood sugars have been well controlled.  He is taking a multivitamin and calcium but no specific high-dose vitamin supplements.  He is being followed for anemia.   Outpatient Medications Prior to Visit  Medication Sig Dispense Refill  . Multiple Vitamins-Minerals (BARIATRIC MULTIVITAMINS/IRON PO) Take 1 tablet by mouth daily.    . Diclofenac Sodium (PENNSAID) 2 % SOLN Place 1 application onto the skin 2 (two) times daily. 1 Bottle 3   No facility-administered medications prior to visit.     ROS Review of Systems  Constitutional: Negative for appetite change, diaphoresis, fatigue and unexpected weight change.  HENT: Negative.  Negative for trouble swallowing.   Eyes: Positive for visual disturbance.  Respiratory: Negative for cough, chest tightness, shortness of breath and wheezing.   Cardiovascular: Negative for chest pain, palpitations and leg swelling.  Gastrointestinal: Negative for abdominal pain, constipation, diarrhea, nausea and vomiting.  Endocrine: Negative.   Genitourinary: Negative.  Negative for difficulty urinating.  Musculoskeletal: Negative.  Negative for arthralgias and myalgias.  Skin: Negative.  Negative for color change and rash.    Neurological: Positive for headaches. Negative for dizziness, tremors, syncope, speech difficulty, weakness, light-headedness and numbness.  Hematological: Negative for adenopathy. Does not bruise/bleed easily.  Psychiatric/Behavioral: Negative.     Objective:  BP 118/76   Pulse 68   Temp 98.4 F (36.9 C) (Oral)   Ht 5' 9"  (1.753 m)   Wt (!) 319 lb (144.7 kg)   SpO2 99%   BMI 47.11 kg/m   BP Readings from Last 3 Encounters:  09/07/18 118/76  07/03/18 127/75  05/22/18 124/68    Wt Readings from Last 3 Encounters:  09/07/18 (!) 319 lb (144.7 kg)  07/03/18 (!) 330 lb (149.7 kg)  06/26/18 (!) 334 lb 8 oz (151.7 kg)    Physical Exam  Constitutional: He is oriented to person, place, and time. No distress.  HENT:  Mouth/Throat: Oropharynx is clear and moist. No oropharyngeal exudate.  Eyes: Pupils are equal, round, and reactive to light. EOM are normal. Right eye exhibits normal extraocular motion and no nystagmus. Left eye exhibits normal extraocular motion and no nystagmus. Right pupil is reactive. Left pupil is reactive.  Neck: Normal range of motion. Neck supple. No JVD present. No thyromegaly present.  Cardiovascular: Normal rate, regular rhythm and normal heart sounds. Exam reveals no gallop.  No murmur heard. Pulmonary/Chest: Effort normal and breath sounds normal. No respiratory distress. He has no wheezes. He has no rales.  Abdominal: Soft. Bowel sounds are normal. He exhibits no mass. There is no hepatosplenomegaly. There is no tenderness. Hernia confirmed negative in the right inguinal area and confirmed negative in the left inguinal area.  Genitourinary: Testes normal and  penis normal. Right testis shows no mass, no swelling and no tenderness. Left testis shows no mass, no swelling and no tenderness. Circumcised. No penile erythema or penile tenderness. No discharge found.  Musculoskeletal: Normal range of motion. He exhibits no edema, tenderness or deformity.   Lymphadenopathy: No inguinal adenopathy noted on the right or left side.  Neurological: He is alert and oriented to person, place, and time. He displays no atrophy, no tremor and normal reflexes. No cranial nerve deficit or sensory deficit. He exhibits normal muscle tone. He displays a negative Romberg sign. Coordination and gait normal.  Reflex Scores:      Tricep reflexes are 0 on the right side and 0 on the left side.      Bicep reflexes are 0 on the right side and 0 on the left side.      Brachioradialis reflexes are 0 on the right side and 0 on the left side.      Patellar reflexes are 0 on the right side and 0 on the left side.      Achilles reflexes are 0 on the right side and 0 on the left side. Skin: Skin is warm and dry. No rash noted. He is not diaphoretic.  Psychiatric: He has a normal mood and affect. His behavior is normal. Judgment and thought content normal.  Vitals reviewed.   Lab Results  Component Value Date   WBC 7.1 09/07/2018   HGB 14.9 09/07/2018   HCT 45.1 09/07/2018   PLT 264.0 09/07/2018   GLUCOSE 84 09/07/2018   CHOL 153 09/07/2018   TRIG 72.0 09/07/2018   HDL 48.70 09/07/2018   LDLCALC 90 09/07/2018   ALT 27 04/24/2018   AST 23 04/24/2018   NA 143 09/07/2018   K 4.0 09/07/2018   CL 104 09/07/2018   CREATININE 0.90 09/07/2018   BUN 15 09/07/2018   CO2 31 09/07/2018   TSH 2.45 08/01/2017   PSA 0.66 09/07/2018   HGBA1C 5.6 09/07/2018   MICROALBUR 0.7 09/07/2018    No results found.  Assessment & Plan:   Joseph Wood was seen today for diabetes, hypertension, anemia, headache and annual exam.  Diagnoses and all orders for this visit:  Type 2 diabetes mellitus with complication, without long-term current use of insulin ([place])-  His A1c is at 5.6%.  His blood sugars are very well controlled.  Medical therapy is not indicated. -     Cancel: Ambulatory referral to Ophthalmology -     Basic metabolic panel; Future -     Hemoglobin A1c; Future -     HM  Diabetes Foot Exam -     Microalbumin / creatinine urine ratio; Future  Deficiency anemia- His H&H are normal now.  I will monitor him for vitamin deficiencies. -     CBC with Differential/Platelet; Future -     Vitamin B12; Future -     IBC panel; Future -     Ferritin; Future -     Folate; Future -     Vitamin B1; Future  HYPERTENSION, BENIGN ESSENTIAL- His blood pressure is well controlled.  Electrolytes and renal function are normal. -     Basic metabolic panel; Future -     Urinalysis, Routine w reflex microscopic; Future  Intractable episodic headache, unspecified headache type- He has a new onset headache, visual disturbance, but a normal neurologic exam.  His sed rate is mildly elevated but not high enough to be concerning for temporal arteritis or vasculitis.  His other labs are also normal.  I have asked him to undergo an MRI of the brain to see if there is a structural lesion to explain his symptoms. -     Sedimentation rate; Future  Double vision- As above -     Ambulatory referral to Ophthalmology  Hyperlipidemia with target LDL less than 100- He has a low ASCVD risk score of 2.8%.  I therefore do not recommend statin therapy for CV risk reduction.  Routine general medical examination at a health care facility- Exam completed, labs reviewed, he refused a flu vaccine, patient education material was given. -     Lipid panel; Future -     PSA; Future   I have discontinued Victoriano J. Matte's Diclofenac Sodium. I am also having him maintain his Multiple Vitamins-Minerals (BARIATRIC MULTIVITAMINS/IRON PO).  No orders of the defined types were placed in this encounter.    Follow-up: Return in about 6 months (around 03/08/2019).  Scarlette Calico, MD

## 2018-09-10 ENCOUNTER — Encounter: Payer: Self-pay | Admitting: Internal Medicine

## 2018-09-11 ENCOUNTER — Ambulatory Visit: Payer: Self-pay | Admitting: Internal Medicine

## 2018-09-11 LAB — VITAMIN B1

## 2018-09-17 ENCOUNTER — Encounter: Payer: Self-pay | Admitting: Internal Medicine

## 2018-09-17 ENCOUNTER — Other Ambulatory Visit: Payer: Self-pay | Admitting: Internal Medicine

## 2018-09-17 DIAGNOSIS — E5111 Dry beriberi: Secondary | ICD-10-CM

## 2018-09-17 MED ORDER — VITAMIN B-1 100 MG PO TABS: 100.0000 mg | ORAL_TABLET | Freq: Every day | ORAL | 1 refills | Status: DC

## 2018-09-27 ENCOUNTER — Encounter (INDEPENDENT_AMBULATORY_CARE_PROVIDER_SITE_OTHER): Payer: BLUE CROSS/BLUE SHIELD | Admitting: Ophthalmology

## 2018-09-27 ENCOUNTER — Telehealth: Payer: Self-pay | Admitting: Internal Medicine

## 2018-09-27 DIAGNOSIS — I10 Essential (primary) hypertension: Secondary | ICD-10-CM

## 2018-09-27 DIAGNOSIS — H43813 Vitreous degeneration, bilateral: Secondary | ICD-10-CM | POA: Diagnosis not present

## 2018-09-27 DIAGNOSIS — H35033 Hypertensive retinopathy, bilateral: Secondary | ICD-10-CM

## 2018-09-27 NOTE — Telephone Encounter (Signed)
Copied from [place] [id number]. Topic: Quick Communication - See Telephone Encounter >> Sep 27, 2018  4:13 PM Vernona Rieger wrote: CRM for notification. See Telephone encounter for: 09/27/18.  Patient states that he just had his eyes checked at the eye specialist. He said Dr Ronnald Ramp wanted him to have this done, he wants Dr Ronnald Ramp to go ahead and proceed with ordering his MRI since the eye exam checked okay. Please contact patient @ [phone]

## 2018-09-28 ENCOUNTER — Other Ambulatory Visit: Payer: Self-pay | Admitting: Internal Medicine

## 2018-09-28 DIAGNOSIS — H532 Diplopia: Secondary | ICD-10-CM

## 2018-09-28 DIAGNOSIS — R51 Headache: Secondary | ICD-10-CM

## 2018-09-28 DIAGNOSIS — R519 Headache, unspecified: Secondary | ICD-10-CM

## 2018-09-28 NOTE — Telephone Encounter (Signed)
Pt agrees to MRI for intractable headache.  Okay to order?

## 2018-09-28 NOTE — Telephone Encounter (Signed)
MRI ordered

## 2018-10-03 NOTE — Telephone Encounter (Signed)
Pt aware.

## 2018-10-07 ENCOUNTER — Ambulatory Visit
Admission: RE | Admit: 2018-10-07 | Discharge: 2018-10-07 | Disposition: A | Discharge status: Home / Self Care | Payer: BLUE CROSS/BLUE SHIELD | Source: Ambulatory Visit | Attending: Internal Medicine | Admitting: Internal Medicine

## 2018-10-07 DIAGNOSIS — R51 Headache: Principal | ICD-10-CM

## 2018-10-07 DIAGNOSIS — R519 Headache, unspecified: Secondary | ICD-10-CM

## 2018-10-07 DIAGNOSIS — H538 Other visual disturbances: Secondary | ICD-10-CM | POA: Diagnosis not present

## 2018-10-07 DIAGNOSIS — H532 Diplopia: Secondary | ICD-10-CM

## 2018-10-09 ENCOUNTER — Encounter: Payer: Self-pay | Admitting: Internal Medicine

## 2018-10-18 ENCOUNTER — Telehealth: Payer: Self-pay | Admitting: Internal Medicine

## 2018-10-18 MED ORDER — GLUCOSE BLOOD VI STRP: ORAL_STRIP | 3 refills | Status: DC

## 2018-10-18 NOTE — Telephone Encounter (Signed)
Reviewed chart pt is up-to-date sent refills to [facility]  

## 2018-10-18 NOTE — Telephone Encounter (Signed)
Copied from [place] [phone]. Topic: Quick Communication - Rx Refill/Question >> Oct 18, 2018  2:01 PM Bea Graff, NT wrote: Medication: Roma Schanz test strip  Has the patient contacted their pharmacy? Yes.   (Agent: If no, request that the patient contact the pharmacy for the refill.) (Agent: If yes, when and what did the pharmacy advise?)  Preferred Pharmacy (with phone number or street name): [place], [place] [phone] (Phone) [phone] (Fax)    Agent: Please be advised that RX refills may take up to 3 business days. We ask that you follow-up with your pharmacy.

## 2018-10-18 NOTE — Telephone Encounter (Signed)
Pt. Is requesting One Touch Verio test strips; no longer on active medication list.  Last office visit with Dr. Ronnald Ramp was 09/07/18.

## 2018-10-24 ENCOUNTER — Encounter: Payer: BLUE CROSS/BLUE SHIELD | Attending: General Surgery | Admitting: Skilled Nursing Facility1

## 2018-10-24 DIAGNOSIS — E669 Obesity, unspecified: Secondary | ICD-10-CM

## 2018-10-26 MED ORDER — LANCETS MISC: 3 refills | Status: AC

## 2018-10-26 MED ORDER — GLUCOSE BLOOD VI STRP: ORAL_STRIP | 3 refills | Status: DC

## 2018-10-26 MED ORDER — BLOOD GLUCOSE MONITOR KIT: PACK | 0 refills | Status: AC

## 2018-10-26 NOTE — Addendum Note (Signed)
Addended by: Aviva Signs M on: 10/26/2018 04:40 PM   Modules accepted: Orders

## 2018-10-26 NOTE — Telephone Encounter (Signed)
Received fax from [facility] that onetouch is not covered but Contour Next is covered. Printed rx for PCP to sign.   Rx faxed to [facility] at [phone]

## 2018-10-30 ENCOUNTER — Encounter: Payer: Self-pay | Admitting: Skilled Nursing Facility1

## 2018-10-30 NOTE — Progress Notes (Signed)
Follow-up visit:  Post-Operative RYGB Surgery  Medical Nutrition Therapy:  Appt start time: 6:00pm end time:  7:00pm  Primary concerns today: Post-operative Bariatric Surgery Nutrition Management 6 Month Post-Op Class  Pt states he really wants to start eating fruit again.   Surgery date: 05/02/2018 Surgery type: RYGB Start weight at [facility]: 407.9 Weight today: 307 Weight loss goal: to be healthy, able to get around and do more with family and daughter, improve foot pain/preventing foot surgery, fish like he used to  [place] COMP RESULTS  10/25/2018   BMI (kg/m^2) 45.3   Fat Mass (lbs) 125.8   Fat Free Mass (lbs) 181.2   Total Body Water (lbs) 138.8    Information Reviewed/ Discussed During Appointment: -Review of composition scale numbers -Fluid requirements (64-100 ounces) -Protein requirements (60-80g) -Strategies for tolerating diet -Advancement of diet to include Starchy vegetables -Barriers to inclusion of new foods -Inclusion of appropriate multivitamin and calcium supplements  -Exercise recommendations   Fluid intake: adequate   Medications: see list Supplementation: adequate   Using straws: no Drinking while eating: no Having you been chewing well:no Chewing/swallowing difficulties: no Changes in vision: no Changes to mood/headaches: no Hair loss/Cahnges to skin/Changes to nails: no Any difficulty focusing or concentrating: no Sweating: no Dizziness/Lightheaded:  Palpitations: no  Carbonated beverages: no N/V/D/C/GAS: no Abdominal Pain: no  Recent physical activity:  ADL's  Progress Towards Goal(s):  In progress.  Handouts given during visit include:  Phase V diet Progression   Goals Sheet  The Benefits of Exercise are endless.....  Support Group Topics  Pt Chosen Goals:  I will eat 3 meals a day 5 daya a week by July 7th 2020 I will drink 32 ounces of plain water 7 days a week by June 19, 2019  Teaching Method Utilized:   Visual Auditory Hands on   Demonstrated degree of understanding via:  Teach Back   Monitoring/Evaluation:  Dietary intake, exercise, and body weight. Follow up in 3 months for 9 month post-op visit.

## 2018-11-08 ENCOUNTER — Ambulatory Visit: Payer: BLUE CROSS/BLUE SHIELD | Admitting: Podiatry

## 2018-12-14 ENCOUNTER — Ambulatory Visit: Payer: BLUE CROSS/BLUE SHIELD | Admitting: Orthotics

## 2018-12-14 DIAGNOSIS — M79672 Pain in left foot: Secondary | ICD-10-CM

## 2018-12-14 DIAGNOSIS — M779 Enthesopathy, unspecified: Secondary | ICD-10-CM

## 2018-12-14 DIAGNOSIS — M722 Plantar fascial fibromatosis: Secondary | ICD-10-CM

## 2018-12-14 NOTE — Progress Notes (Signed)
Adjusted foot orthotics by removing forefoot and made just a 3/4 instead.

## 2019-01-25 ENCOUNTER — Encounter: Payer: Self-pay | Admitting: Skilled Nursing Facility1

## 2019-01-25 ENCOUNTER — Encounter: Payer: BLUE CROSS/BLUE SHIELD | Attending: General Surgery | Admitting: Skilled Nursing Facility1

## 2019-01-25 DIAGNOSIS — E119 Type 2 diabetes mellitus without complications: Secondary | ICD-10-CM

## 2019-01-25 NOTE — Patient Instructions (Addendum)
-  Take the appropriate multivitamin  -Enure your smoothies have protein and non starchy vegetables in them

## 2019-01-25 NOTE — Progress Notes (Signed)
Follow-up visit:  Post-Operative RYGB Surgery  Medical Nutrition Therapy:  Appt start time: 6:00pm end time:  7:00pm  Primary concerns today: Post-operative Bariatric Surgery Nutrition Management 6 Month Post-Op Class  Pt arrives having maintained weight. Pt state she has had to work about 60 hours this week. Pt states he has not been able to work out like he wants due to his work schedule. Pt states he is frustrated with his weight stall. Pt states he has been so tired he has not had an appetite and has been eating really light.  Pt states he really really wants to add fruit and wants to make smoothies with his new blender. Pt felt much better about his weight once he realized he lost 10 pounds of fat.  Pt states he did have 1 or 2 low blood sugars that he recognized and corrected.   Surgery date: 05/02/2018 Surgery type: RYGB Start weight at [facility]: 407.9 Weight today: 306.8 Weight loss goal: to be healthy, able to get around and do more with family and daughter, improve foot pain/preventing foot surgery, fish like he used to  [place] RESULTS  10/25/2018 01/25/2019   BMI (kg/m^2) 45.3 44.7   Fat Mass (lbs) 125.8 115.4   Fat Free Mass (lbs) 181.2 191.4   Total Body Water (lbs) 138.8 142   24 hour recall:  Half Tortilla with deli meat and cheese Pork rinds  Other half deli meat tortilla Handful pork rinds Hamburger patty  Pork rinds  Fluid intake: water, coffee: unknown amount    Medications: see list Supplementation: [employer] multi and calcium    Using straws: no Drinking while eating: no Having you been chewing well:no Chewing/swallowing difficulties: no Changes in vision: no Changes to mood/headaches: no Hair loss/Cahnges to skin/Changes to nails: no Any difficulty focusing or concentrating: no Sweating: no Dizziness/Lightheaded:  Palpitations: no  Carbonated beverages: no N/V/D/C/GAS: no Abdominal Pain: no  Recent physical activity:  ADL's  Progress  Towards Goal(s):  In progress.  Handouts given during visit include: -Smoothie recipe -Take the appropriate multivitamin -Enure your smoothies have protein and non starchy vegetables in them   Teaching Method Utilized:  Visual Auditory Hands on   Demonstrated degree of understanding via:  Teach Back   Monitoring/Evaluation:  Dietary intake, exercise, and body weight. Follow up in 3 months for 1 year post-op visit.

## 2019-01-30 IMAGING — MR MR HEAD W/O CM
8 series · 48 of 48 positions shown · non-contrast
Comparison: Brain MRI 05/17/2017.

CLINICAL DATA: 47-year-old male with episodes of headache and
blurred vision.

EXAM:
MRI HEAD WITHOUT CONTRAST
TECHNIQUE: Multiplanar, multiecho pulse sequences of the brain and surrounding
structures were obtained without intravenous contrast.

[Series 3: t1_se_sag · sagittal · 5.0mm · 0.45mm/px · 3 of 19 slices shown]
[im 1/19]
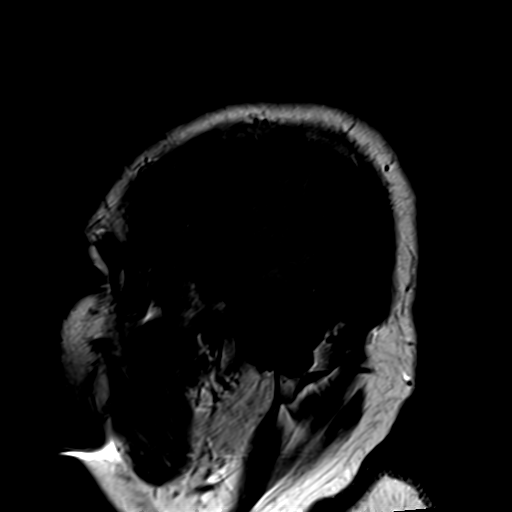
[im 10/19]
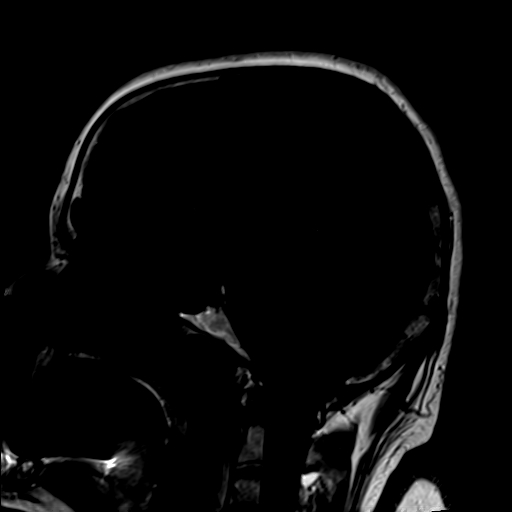
[im 19/19]
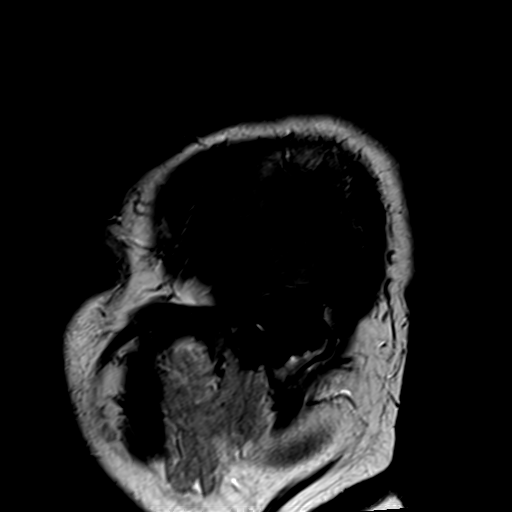

[Series 4: t2_tse_tra_512 · axial · 5.0mm · 0.60mm/px · z∈[-58,+85]mm · 3 of 24 slices shown]
[im 1/24]
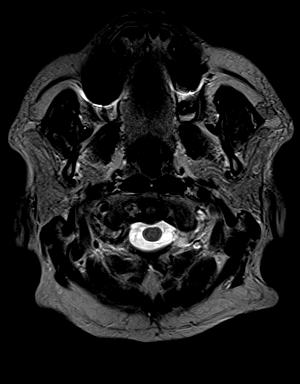
[im 12/24]
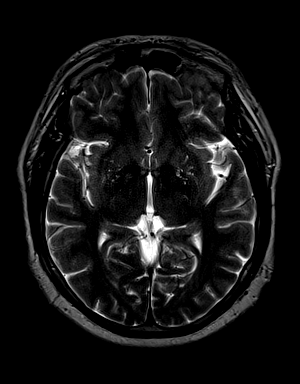
[im 24/24]
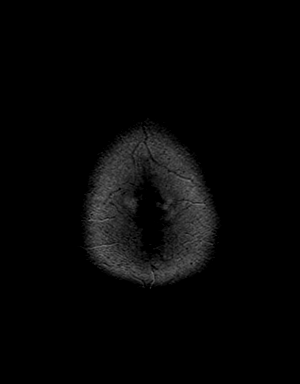

[Series 5: ep2d_diff_(id)_trace · axial · 3.0mm · 1.80mm/px · z∈[-54,+79]mm · 10 of 88 slices shown]
[im 1/88]
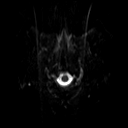
[im 10/88]
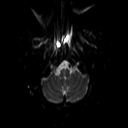
[im 20/88]
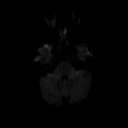
[im 30/88]
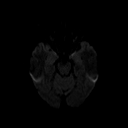
[im 39/88]
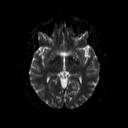
[im 49/88]
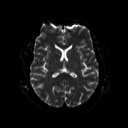
[im 59/88]
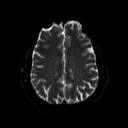
[im 68/88]
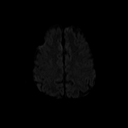
[im 78/88]
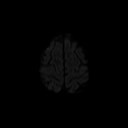
[im 88/88]
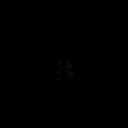

[Series 6: ep2d_diff_(id)_trace_adc · axial · 3.0mm · 1.80mm/px · z∈[-54,+79]mm · 5 of 46 slices shown]
[im 1/46]
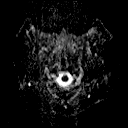
[im 12/46]
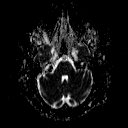
[im 23/46]
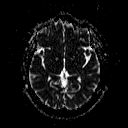
[im 34/46]
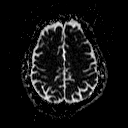
[im 46/46]
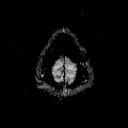

[Series 8: swi_images · axial · 4.0mm · 0.90mm/px · z∈[-56,+83]mm · 4 of 36 slices shown]
[im 1/36]
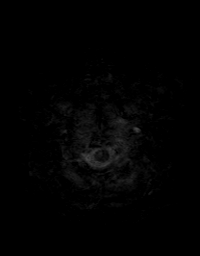
[im 12/36]
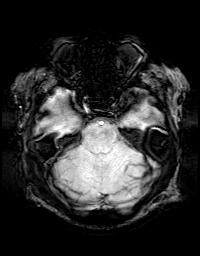
[im 24/36]
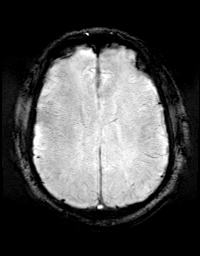
[im 36/36]
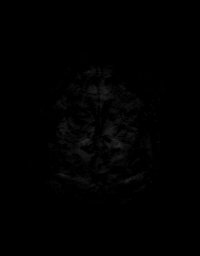

[Series 9: FLAIR · axial · 3.0mm · 0.43mm/px · z∈[-57,+82]mm · 3 of 27 slices shown]
[im 1/27]
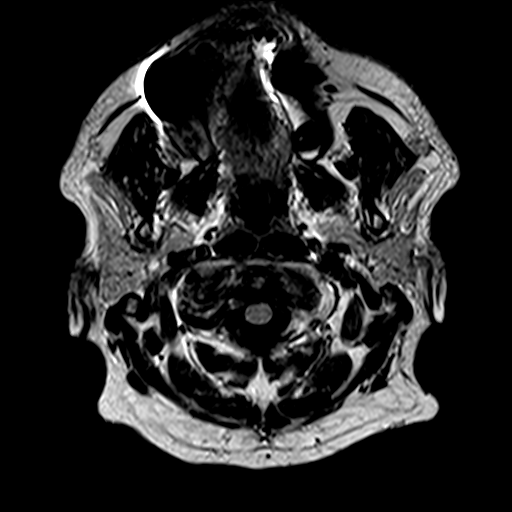
[im 14/27]
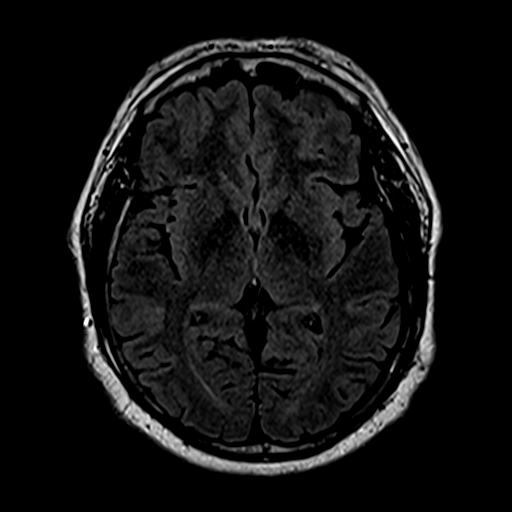
[im 27/27]
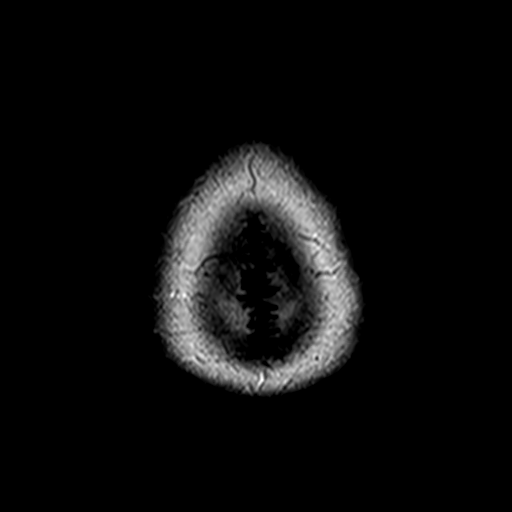

[Series 10: t1_mpr_tra · axial · 1.0mm · 0.72mm/px · z∈[-57,+85]mm · 17 of 144 slices shown]
[im 1/144]
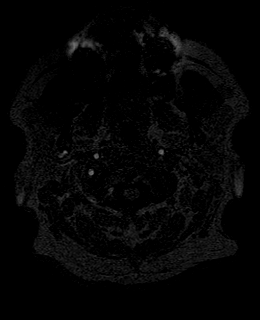
[im 9/144]
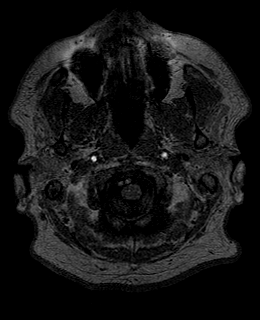
[im 18/144]
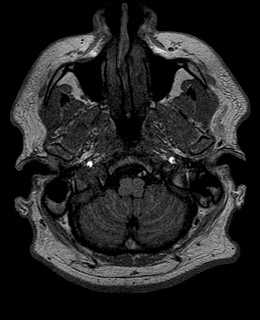
[im 27/144]
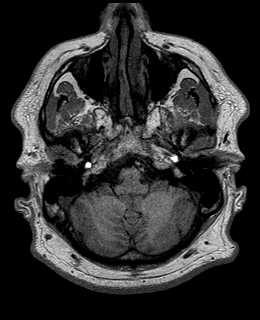
[im 36/144]
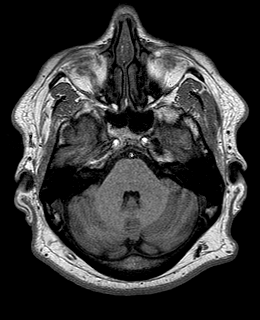
[im 45/144]
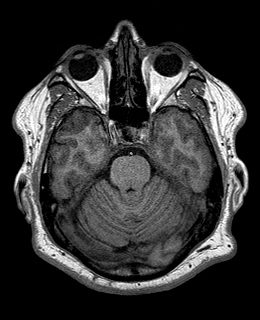
[im 54/144]
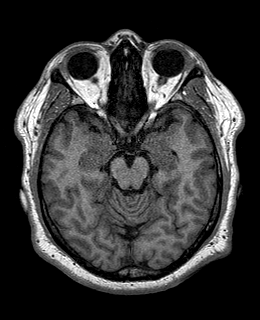
[im 63/144]
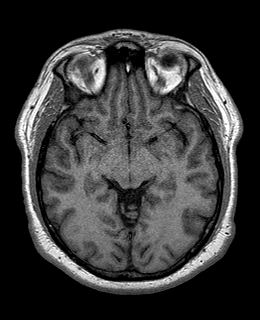
[im 72/144]
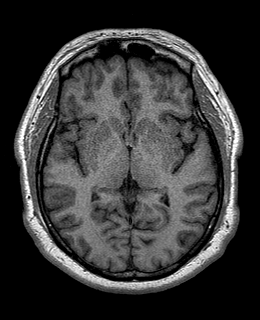
[im 81/144]
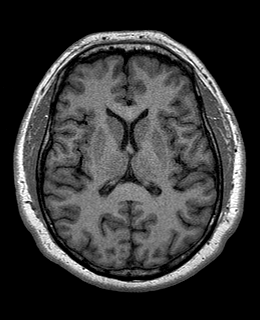
[im 90/144]
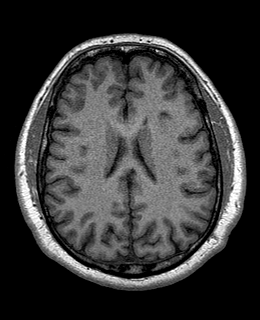
[im 99/144]
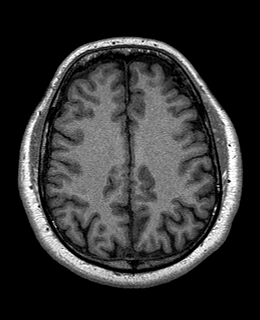
[im 108/144]
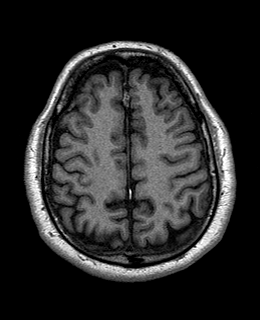
[im 117/144]
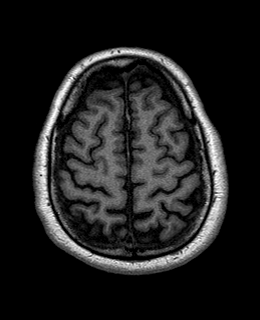
[im 126/144]
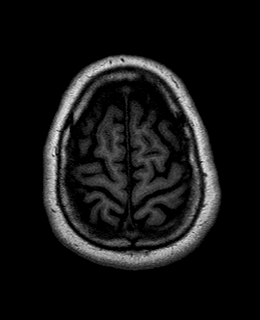
[im 135/144]
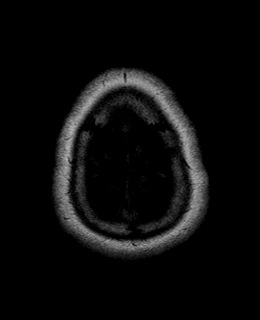
[im 144/144]
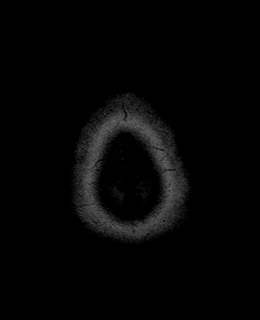

[Series 11: T2 · coronal · 5.0mm · 0.45mm/px · 3 of 24 slices shown]
[im 1/24]
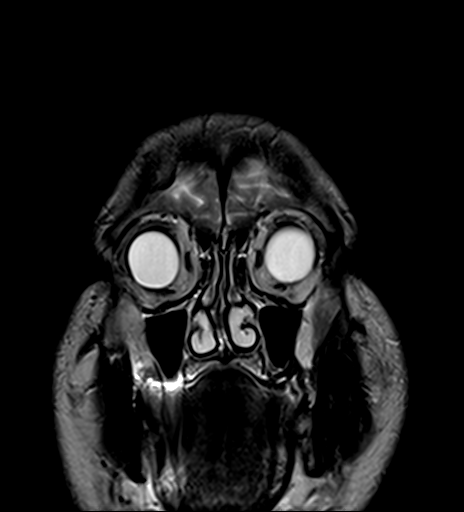
[im 12/24]
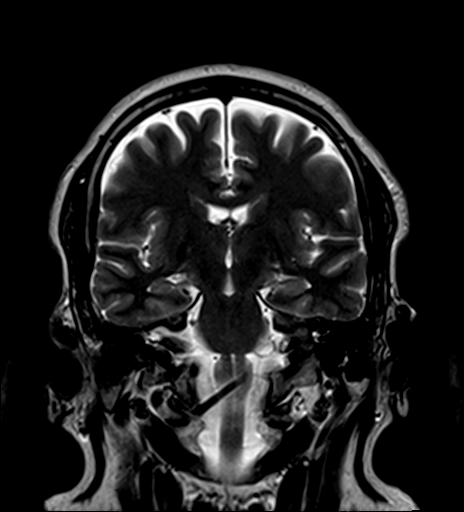
[im 24/24]
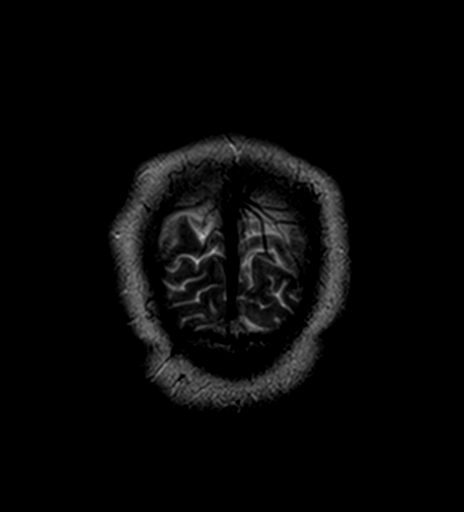

[48 of 48 positions shown; findings below may reference images not displayed]

FINDINGS: Brain: Cerebral volume is stable and within normal limits.

No restricted diffusion to suggest acute infarction. No midline
shift, mass effect, evidence of mass lesion, ventriculomegaly,
extra-axial collection or acute intracranial hemorrhage.
Cervicomedullary junction and pituitary are within normal limits.

Gray and white matter signal is stable and within normal limits
throughout the brain. No encephalomalacia or chronic cerebral blood
products identified.

Vascular: Major intracranial vascular flow voids are stable. The
right vertebral artery appears dominant.

Skull and upper cervical spine: Normal visible cervical spine.
Normal bone marrow signal.

Sinuses/Orbits: The orbit soft tissues appear stable and normal.
Paranasal sinuses and mastoids are stable and well pneumatized.

Other: Grossly stable and normal visible internal auditory
structures (better demonstrated in 0288). Scalp and face soft
tissues appear negative.
IMPRESSION: Stable and normal noncontrast MRI appearance of the brain.

## 2019-02-26 ENCOUNTER — Encounter (HOSPITAL_COMMUNITY): Payer: Self-pay

## 2019-03-08 ENCOUNTER — Ambulatory Visit: Payer: Self-pay | Admitting: Internal Medicine

## 2019-03-14 ENCOUNTER — Ambulatory Visit (INDEPENDENT_AMBULATORY_CARE_PROVIDER_SITE_OTHER): Payer: BLUE CROSS/BLUE SHIELD | Admitting: Internal Medicine

## 2019-03-14 ENCOUNTER — Encounter: Payer: Self-pay | Admitting: Internal Medicine

## 2019-03-14 DIAGNOSIS — E118 Type 2 diabetes mellitus with unspecified complications: Secondary | ICD-10-CM | POA: Diagnosis not present

## 2019-03-14 NOTE — Progress Notes (Signed)
Virtual Visit via Video Note  I connected with Joseph J Harbour Jr. on 03/14/19 at  4:00 PM EDT by a video enabled telemedicine application and verified that I am speaking with the correct person using two identifiers.   I discussed the limitations of evaluation and management by telemedicine and the availability of in person appointments. The patient expressed understanding and agreed to proceed.  History of Present Illness: He asked to do a virtual visit regarding his blood sugar control.  His lowest blood sugar recently was 63 and he was symptomatic with that.  His other blood sugars are usually in the 80-103 range.  He has had no blood sugars in the last month above 103.  He has done nutritional counseling.  He is not taking any hypoglycemic agents.  He is status post bariatric surgery.  He is experiencing foot pain for which he is going to see a podiatrist about.  He felt well today and offers no complaints.  He is not willing to come in at this time to do an A1c because of the COVID-19 pandemic.    Observations/Objective: On the video he was alert, oriented, and in no acute distress.   Assessment and Plan: It sounds like his blood sugars have been adequately well controlled with nutritional counseling and status post bariatric surgery.  I do not think he needs to be on hypoglycemic agents at this time.  He will continue to monitor his blood sugars.  Will let me know if he develops any new or worsening symptoms.   Follow Up Instructions: He agrees to come in in 1-2 months to have an A1c done.  He will let me know if he develops any new symptoms.   I discussed the assessment and treatment plan with the patient. The patient was provided an opportunity to ask questions and all were answered. The patient agreed with the plan and demonstrated an understanding of the instructions.   The patient was advised to call back or seek an in-person evaluation if the symptoms worsen or if the condition  fails to improve as anticipated.  I provided 15 minutes of non-face-to-face time during this encounter.   Scarlette Calico, MD

## 2019-04-19 ENCOUNTER — Ambulatory Visit (INDEPENDENT_AMBULATORY_CARE_PROVIDER_SITE_OTHER): Payer: BLUE CROSS/BLUE SHIELD

## 2019-04-19 ENCOUNTER — Ambulatory Visit (INDEPENDENT_AMBULATORY_CARE_PROVIDER_SITE_OTHER): Payer: BLUE CROSS/BLUE SHIELD | Admitting: Podiatry

## 2019-04-19 ENCOUNTER — Other Ambulatory Visit: Payer: Self-pay

## 2019-04-19 ENCOUNTER — Encounter: Payer: Self-pay | Admitting: Podiatry

## 2019-04-19 ENCOUNTER — Other Ambulatory Visit: Payer: Self-pay | Admitting: Podiatry

## 2019-04-19 VITALS — Temp 98.8°F

## 2019-04-19 DIAGNOSIS — M779 Enthesopathy, unspecified: Secondary | ICD-10-CM

## 2019-04-19 DIAGNOSIS — M76822 Posterior tibial tendinitis, left leg: Secondary | ICD-10-CM

## 2019-04-19 DIAGNOSIS — M25572 Pain in left ankle and joints of left foot: Secondary | ICD-10-CM

## 2019-04-19 MED ORDER — TRIAMCINOLONE ACETONIDE 10 MG/ML IJ SUSP
10.0000 mg | Freq: Once | INTRAMUSCULAR | Status: AC
  Administered 2019-04-19: 10 mg

## 2019-04-19 NOTE — Progress Notes (Signed)
Subjective:   Patient ID: Joseph Wood., male   DOB: 48 y.o.   MRN: [id number]   HPI Patient presents stating his ankle is really bothering me and he feels unstable.  Patient has been losing weight and lost 130 pounds but still needs to lose more weight and is unable to be active due to the pain and states that he feels like the foot has flattened more over time   ROS      Objective:  Physical Exam  Neurovascular status intact with patient having severe left flatfoot deformity with diminished range of motion within the subtalar joint.  There is acute inflammation pain within the subtalar and lateral ankle gutter and there is instability of the posterior tibial tendon with inability to hold the arch up properly with heavy weight is complicating factor.  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  If it is not successful will get have to consider CT scan and surgery and at this point to try to get him temporarily I did inject the sinus tarsi 3 mg Kenalog 5 mg Xylocaine  X-ray indicates that there is severe flatfoot deformity noted left with signs of subtalar joint arthritis signed visit

## 2019-04-20 ENCOUNTER — Ambulatory Visit: Payer: BLUE CROSS/BLUE SHIELD | Admitting: Orthotics

## 2019-04-20 DIAGNOSIS — M25572 Pain in left ankle and joints of left foot: Secondary | ICD-10-CM

## 2019-04-20 DIAGNOSIS — M722 Plantar fascial fibromatosis: Secondary | ICD-10-CM

## 2019-04-20 DIAGNOSIS — M779 Enthesopathy, unspecified: Secondary | ICD-10-CM

## 2019-04-20 DIAGNOSIS — M76822 Posterior tibial tendinitis, left leg: Secondary | ICD-10-CM

## 2019-04-20 NOTE — Progress Notes (Signed)
Patient presents today for evaluation/casting for AFO brace (L).   Patient has hx of the following conditions: Gait instability,  Ankle instabilty,  Gait analysis done and patient displays abnormality of gait in both sagittial and frontal planes, and could benefit in aggressive ankle support.  Patient chose [place] brace w/ lace/speed laces.

## 2019-04-23 DIAGNOSIS — Z9884 Bariatric surgery status: Secondary | ICD-10-CM | POA: Diagnosis not present

## 2019-04-24 ENCOUNTER — Telehealth: Payer: Self-pay | Admitting: Podiatry

## 2019-04-24 NOTE — Telephone Encounter (Signed)
Pt called stating that he was waiting for a call from [facility] to set up an appt for a CT Scan but has not been called. Patient calling to follow up.

## 2019-04-24 NOTE — Telephone Encounter (Signed)
I called pt and informed pt I had reviewed his last appt with Dr. Paulla Dolly, and Dr. Paulla Dolly had wanted to see if he had improvement with the AFO Brace and if no improvement would order the CT. Pt states understanding.

## 2019-05-01 ENCOUNTER — Ambulatory Visit: Payer: Self-pay

## 2019-05-17 ENCOUNTER — Ambulatory Visit (INDEPENDENT_AMBULATORY_CARE_PROVIDER_SITE_OTHER): Payer: BC Managed Care – PPO | Admitting: Orthotics

## 2019-05-17 ENCOUNTER — Other Ambulatory Visit: Payer: Self-pay

## 2019-05-17 DIAGNOSIS — M722 Plantar fascial fibromatosis: Secondary | ICD-10-CM

## 2019-05-17 DIAGNOSIS — M76822 Posterior tibial tendinitis, left leg: Secondary | ICD-10-CM

## 2019-05-17 DIAGNOSIS — M25572 Pain in left ankle and joints of left foot: Secondary | ICD-10-CM

## 2019-05-17 NOTE — Progress Notes (Signed)
Patient presents today for evaluation/casting for AFO brace (L).   Patient has hx of the following conditions: Gait instability,  Ankle instabilty,  Gait analysis done and patient displays abnormality of gait in both sagittial and frontal planes, and could benefit in aggressive ankle support.  Patient chose [place] brace w/ lace/speed laces.

## 2019-05-30 ENCOUNTER — Ambulatory Visit: Payer: BLUE CROSS/BLUE SHIELD | Admitting: Dietician

## 2019-06-13 ENCOUNTER — Encounter: Payer: Self-pay | Admitting: Dietician

## 2019-06-13 ENCOUNTER — Encounter: Payer: Self-pay | Attending: General Surgery | Admitting: Dietician

## 2019-06-13 ENCOUNTER — Other Ambulatory Visit: Payer: Self-pay

## 2019-06-13 DIAGNOSIS — E118 Type 2 diabetes mellitus with unspecified complications: Secondary | ICD-10-CM | POA: Insufficient documentation

## 2019-06-13 NOTE — Progress Notes (Signed)
Bariatric Follow-Up Visit Medical Nutrition Therapy  Appt Start Time: 3:55pm  End Time: 4:30pm  1 Year + 1 Month Post-Operative RYGB Surgery Surgery Date: 05/02/2018  Pt states within the past few months he ended up regaining some weight but is now losing again, about at the same weight at his last visit 5 months ago. States he has been busy with work, even throughout the coronavirus pandemic. Been working on revamping his boat so he can use it this summer while at the [place] with his teenage daughter.    NUTRITION ASSESSMENT  Anthropometrics  Start weight at [facility]: 407.9 lbs (01/30/2018) Today's weight: 305.2 lbs Total weight lost: 102.7 lbs BMI: 43.8  TANITA  BODY COMP RESULTS  10/25/2018 01/25/2019   BMI (kg/m^2) 45.3 44.7   Fat Mass (lbs) 125.8 115.4   Fat Free Mass (lbs) 181.2 191.4   Total Body Water (lbs) 138.8 142    Body Composition Scale Total Body Fat: 37.2% Visceral Fat: 31 Fat-Free Mass: 62.7%  Total Body Water: 43.7%  Muscle-Mass: 57.5 lbs Body Fat Displacement Torso: 70.5lbs Left Leg: 14.1lbs Right Leg: 14.1lbs Left Arm: 7lbs Right Arm: 7lbs   24-Hr Dietary Recall First Meal: Premier Protein shake  Snack: fruit   Second Meal: chicken Snack: slim jim (or nabs)   Third Meal: hamburger patty + cheese + lettuce + tomato Snack: none stated  Beverages: water, Gatorade Zero, sugar-free water flavor packets  Food & Nutrition Related Hx Dietary Hx: Avoiding pasta, rice, bread. Will choose whole wheat tortillas for wraps. Likes chicken and broiled fish. Sometimes eats cereal with 1% or 2% milk. Asked about guidelines for choosing cereal, stating it is hard to find cereal that is not so high in carbs. Asked if pickles, and whole wheat crackers with cheese are good snacks.  Supplements: bariatric MVI + calcium  Estimated Daily Fluid Intake: 100+ oz Estimated Daily Protein Intake: 80+ g  Physical Activity  Current average weekly physical activity: walking dogs,  walking at work on the job, hobbies such as rebuilding his boat    Post-Op Goals Using straws: no Drinking while eating: no (rarely)  Chewing/swallowing difficulties: no  Changes in vision: no Changes to mood/headaches: no Hair loss/changes to skin/nails: no Difficulty focusing/concentrating: no Sweating: no Dizziness/lightheadedness: no Palpitations: no  Carbonated/caffeinated beverages: no N/V/D/C/Gas: no (constipation occasionally)  Abdominal pain: no Dumping syndrome: no Last Lap-Band fill: N/A    NUTRITION DIAGNOSIS  Overweight/obesity ([place]-3.3) related to past poor dietary habits and physical inactivity as evidenced by patient w/ completed RYGB surgery following dietary guidelines for continued weight loss.   NUTRITION INTERVENTION Nutrition counseling (C-1) and education (E-2) to facilitate bariatric surgery goals, including: . Diet advancement to the next phase (phase VII) now including all foods . The importance of consuming adequate calories as well as certain nutrients daily due to the body's need for essential vitamins, minerals, and fats . The importance of daily physical activity and to reach a goal of at least 150 minutes of moderate to vigorous physical activity weekly (or as directed by their physician) due to benefits such as increased musculature and improved lab values  Handouts Provided Include   Phase VII: Lifelong Maintenance   Bariatric Meal Ideas   Learning Style & Readiness for Change Teaching method utilized: Visual & Auditory  Demonstrated degree of understanding via: Teach Back  Barriers to learning/adherence to lifestyle change: None Identified     MONITORING & EVALUATION Dietary intake, weekly physical activity, body weight, and goals in 4  months. (Per pt request)  Next Steps Patient is to follow-up in about 4 months for 1 1/2 year post-op visit.

## 2019-06-13 NOTE — Patient Instructions (Addendum)
Here are your numbers from previous visits:   TANITA  BODY COMP RESULTS  10/25/2018 01/25/2019   BMI (kg/m^2) 45.3 44.7   Fat Mass (lbs) 125.8 115.4   Fat Free Mass (lbs) 181.2 191.4   Total Body Water (lbs) 138.8 142    Use the Meal Ideas sheet to help build balanced lunch and dinner meals.   Remember your physical activity goal (150 minutes per week) and work towards reaching that. Keeping busy with hobbies and walking a lot at work helps too!  Keep up the [place] work!

## 2019-10-01 ENCOUNTER — Ambulatory Visit: Payer: BC Managed Care – PPO | Admitting: Podiatry

## 2019-10-03 ENCOUNTER — Other Ambulatory Visit: Payer: Self-pay

## 2019-10-03 ENCOUNTER — Ambulatory Visit (INDEPENDENT_AMBULATORY_CARE_PROVIDER_SITE_OTHER): Payer: BC Managed Care – PPO | Admitting: Podiatry

## 2019-10-03 ENCOUNTER — Encounter: Payer: Self-pay | Admitting: Podiatry

## 2019-10-03 ENCOUNTER — Ambulatory Visit (INDEPENDENT_AMBULATORY_CARE_PROVIDER_SITE_OTHER): Payer: BC Managed Care – PPO

## 2019-10-03 DIAGNOSIS — M779 Enthesopathy, unspecified: Secondary | ICD-10-CM | POA: Diagnosis not present

## 2019-10-03 DIAGNOSIS — M76822 Posterior tibial tendinitis, left leg: Secondary | ICD-10-CM

## 2019-10-04 ENCOUNTER — Telehealth: Payer: Self-pay | Admitting: *Deleted

## 2019-10-04 DIAGNOSIS — M779 Enthesopathy, unspecified: Secondary | ICD-10-CM

## 2019-10-04 DIAGNOSIS — M25572 Pain in left ankle and joints of left foot: Secondary | ICD-10-CM

## 2019-10-04 DIAGNOSIS — M76822 Posterior tibial tendinitis, left leg: Secondary | ICD-10-CM

## 2019-10-04 DIAGNOSIS — M79672 Pain in left foot: Secondary | ICD-10-CM

## 2019-10-04 NOTE — Telephone Encounter (Addendum)
Orders for CT left ankle ordered by Dr. Paulla Dolly to Gretta Arab, RN for pre-cert, faxed to [facility].

## 2019-10-04 NOTE — Progress Notes (Signed)
Subjective:   Patient ID: Joseph Files., male   DOB: 48 y.o.   MRN: [id number]   HPI Patient presents stating my foot is still severely hurting me and I have to walk on cement floors all day and I have been wearing the AFO brace but still having pain   ROS      Objective:  Physical Exam  Neurovascular status intact with severe loss of motion of the subtalar joint left with exquisite discomfort within the subtalar joint midfoot with severe flatfoot collapsing deformity and obesity is complicating factors     Assessment:  Subtalar joint arthritis left with possibility of midfoot involvement with severe flatfoot deformity     Plan:  Reviewed condition at great length discussing the treatments that we have attempted up to this point.  He is not doing well with this and is more motivated for long-term solution given his inability to bear weight down on the foot and perform his job with any degree of comfort.  At this point we are sending for CT scan to rule out and understand better the arthritic issue with the probability for some type of fusion which would be necessary for this patient.  We will get results and decide what will be best for the patient  X-rays today indicate severe collapse of the subtalar joint midtarsal joint left with probability for severe arthritic condition

## 2019-10-08 ENCOUNTER — Telehealth: Payer: Self-pay | Admitting: Podiatry

## 2019-10-08 NOTE — Telephone Encounter (Signed)
I told pt that the orders had been sent [facility] imaging [phone] and he could schedule 3-5 days from today, because we had to get the CT pre-certed.

## 2019-10-08 NOTE — Telephone Encounter (Signed)
Pt was seen in office on 10/21 and is supposed to be scheduled for a CT Scan and has not heard anything. Pt is calling to follow up

## 2019-10-10 ENCOUNTER — Telehealth: Payer: Self-pay | Admitting: Podiatry

## 2019-10-10 NOTE — Telephone Encounter (Signed)
Left message informing [place] I had sent a message to Dr.Regal to see if he wanted to perform Peer to Peer but may want to reschedule pt due to Dr. Mellody Drown pt and surgery schedule.

## 2019-10-10 NOTE — Telephone Encounter (Signed)
[facility] Drusilla Kanner [phone] called to say that patients ins denied the Ct are they gonnna do a p2p  Or cancel pt appt on friday

## 2019-10-11 NOTE — Telephone Encounter (Signed)
I don't mind doing peer to peer if needed

## 2019-10-11 NOTE — Telephone Encounter (Signed)
I spoke with [place] she states Case for left ankle CT was denied 10/09/2019, there would be no reconsideration, provider can request a courtesy review by leaving pt information, provider information and contact information, then may be contacted and or can fax information to fax number given on Provider line recording, and I was transferred to the Provider Line to leave information for a courtesy review. I left pt, provider, and orders requested on Provider Line and will fax clinicals to given fax number [phone] Attention Appeals Team. Faxed 10/03/2019 - 04/19/2019 clinicals, copy of orders and insurance card to [employer].

## 2019-10-12 ENCOUNTER — Other Ambulatory Visit: Payer: Self-pay

## 2019-10-17 NOTE — Telephone Encounter (Signed)
I informed pt of the PA for the CT of the left ankle and reminded him of 10/19/2019 4:30pm CT appt.

## 2019-10-17 NOTE — Telephone Encounter (Signed)
[facility] APPROVED CT LEFT ANKLE 73721 WITHOUT CONTRAST, PRE-AUTHORIZATION NUMBER:  [id number]. Faxed orders with PA to [place].

## 2019-10-19 ENCOUNTER — Other Ambulatory Visit: Payer: Self-pay

## 2019-10-19 ENCOUNTER — Ambulatory Visit
Admission: RE | Admit: 2019-10-19 | Discharge: 2019-10-19 | Disposition: A | Discharge status: Home / Self Care | Payer: BC Managed Care – PPO | Source: Ambulatory Visit | Attending: Podiatry | Admitting: Podiatry

## 2019-10-19 DIAGNOSIS — M779 Enthesopathy, unspecified: Secondary | ICD-10-CM

## 2019-10-19 DIAGNOSIS — M25572 Pain in left ankle and joints of left foot: Secondary | ICD-10-CM

## 2019-10-19 DIAGNOSIS — M7989 Other specified soft tissue disorders: Secondary | ICD-10-CM | POA: Diagnosis not present

## 2019-10-19 DIAGNOSIS — M79672 Pain in left foot: Secondary | ICD-10-CM

## 2019-10-19 DIAGNOSIS — M19072 Primary osteoarthritis, left ankle and foot: Secondary | ICD-10-CM | POA: Diagnosis not present

## 2019-10-19 DIAGNOSIS — M76822 Posterior tibial tendinitis, left leg: Secondary | ICD-10-CM

## 2019-10-23 ENCOUNTER — Telehealth: Payer: Self-pay | Admitting: Podiatry

## 2019-10-23 NOTE — Telephone Encounter (Signed)
Did CT Friday 10/19/2019 wanted to know if results were in. Please call when ready

## 2019-10-24 ENCOUNTER — Telehealth: Payer: Self-pay | Admitting: Podiatry

## 2019-10-24 MED ORDER — TRAMADOL HCL 50 MG PO TABS: 50.0000 mg | ORAL_TABLET | Freq: Three times a day (TID) | ORAL | 0 refills | Status: AC | PRN

## 2019-10-24 NOTE — Telephone Encounter (Addendum)
I informed pt of Dr. Mellody Drown orders for the Tramadol for the pain, could not take at work and the CT results would be reviewed with the other physicians and once the reviewed would give me instructions to call to pt. I offered pt an appt next week and transferred to scheduler.

## 2019-10-24 NOTE — Telephone Encounter (Signed)
I need to discuss with other Docs to decide what we are going to do

## 2019-10-24 NOTE — Telephone Encounter (Signed)
Patient calling today to see if he could get something for pain does he need to be seen or is it somethin Dr Paulla Dolly can call in he just wants to know what he can do hes in pain

## 2019-10-24 NOTE — Addendum Note (Signed)
Addended by: Harriett Sine D on: 10/24/2019 02:07 PM   Modules accepted: Orders

## 2019-10-24 NOTE — Telephone Encounter (Signed)
Tramadol orders called to [place].

## 2019-10-24 NOTE — Telephone Encounter (Signed)
I would consider tramadol if he has not taken.

## 2019-10-24 NOTE — Telephone Encounter (Signed)
Informed pt in Telephone Call message 10/24/2019.

## 2019-10-31 ENCOUNTER — Other Ambulatory Visit: Payer: Self-pay

## 2019-10-31 ENCOUNTER — Ambulatory Visit (INDEPENDENT_AMBULATORY_CARE_PROVIDER_SITE_OTHER): Payer: BC Managed Care – PPO | Admitting: Podiatry

## 2019-10-31 ENCOUNTER — Encounter: Payer: Self-pay | Admitting: Podiatry

## 2019-10-31 DIAGNOSIS — M76822 Posterior tibial tendinitis, left leg: Secondary | ICD-10-CM | POA: Diagnosis not present

## 2019-10-31 DIAGNOSIS — M25572 Pain in left ankle and joints of left foot: Secondary | ICD-10-CM

## 2019-10-31 NOTE — Progress Notes (Signed)
Subjective:   Patient ID: Joseph Wood., male   DOB: 48 y.o.   MRN: [id number]   HPI Patient states my foot hurts more all the time and I know I am getting need to do something with this due to my weightbearing job and I am doing well with my sugar with my last A1c being 5.6   ROS      Objective:  Physical Exam  Neurovascular status intact with rigid flatfoot deformity left that is very painful when pressed in the subtalar talonavicular calcaneocuboid joint with chronic problems occurring around the joint surfaces     Assessment:  Subtalar joint midtarsal joint arthritis left with rigid flatfoot deformity     Plan:  H&P reviewed condition and discussed with Dr. Amalia Hailey were both in agreement that triple arthrodesis will be best for this patient due to results of CT scan and pain the patient is in with failure to respond to numerous conservative treatments.  Patient wants surgery and will have scheduled for mid January and at this time I went ahead and I educated him on the procedure and I did dispense air fracture walker to try to give him temporary relief until the procedure is performed.  He will see Dr. Amalia Hailey in mid December for consult signed this

## 2019-11-01 ENCOUNTER — Ambulatory Visit: Payer: Self-pay | Admitting: Skilled Nursing Facility1

## 2019-11-02 ENCOUNTER — Telehealth: Payer: Self-pay | Admitting: *Deleted

## 2019-11-02 NOTE — Telephone Encounter (Signed)
I attempted to call the patient to schedule a surgery date.  Dr. Paulla Dolly has referred him to Dr. Amalia Hailey.  He will be having a Triple Arthrodesis.  Dr. Posey Pronto is to assist.

## 2019-11-16 ENCOUNTER — Telehealth: Payer: Self-pay | Admitting: Podiatry

## 2019-11-16 NOTE — Telephone Encounter (Signed)
Pt is currently taking Tramadol and would like to know if there is another pain medication the doctor could prescribe that he can take while at work.  Please give patient a call.

## 2019-11-19 ENCOUNTER — Telehealth: Payer: Self-pay | Admitting: *Deleted

## 2019-11-19 NOTE — Telephone Encounter (Signed)
Returned patient's call regarding whether there was a stronger medication other than Tramadol that he could take while being at work. Patient is scheduled to get surgery done by Dr. Amalia Hailey in January 2020.  I talked to Dr. Paulla Dolly who said that we could not give patient anything stronger until after he has surgery in January 2020.  Patient asked me if he could get surgery done in December and not January. Dr. Amalia Hailey schedule is booked throughout the rest of the year. I told patient that we could try to put him on a wait list in case anyone cancels their surgery.  [facility] told me that patient's surgery will take 3 hours and that they do not think he can get surgery done in December.

## 2019-11-22 ENCOUNTER — Telehealth: Payer: Self-pay | Admitting: *Deleted

## 2019-11-22 NOTE — Telephone Encounter (Signed)
I attempted to call Joseph Wood to schedule his surgery date.  His voicemail was full, so I could not leave a message.

## 2019-11-26 NOTE — Telephone Encounter (Signed)
I am calling from Dr. Amalia Hailey' office.  I'm calling to see if you want to schedule your surgery date.  "I'm scheduled to come in to see Dr. Amalia Hailey this Wednesday.  He's supposed to go over all that with me and tell me what's going to be done.  But, I'd like to do it this month if possible."  I don't know if we'll be able to do it this year.  You can discuss it with Dr. Amalia Hailey when you come in.

## 2019-11-28 ENCOUNTER — Encounter: Payer: Self-pay | Admitting: Podiatry

## 2019-11-28 ENCOUNTER — Other Ambulatory Visit: Payer: Self-pay

## 2019-11-28 ENCOUNTER — Ambulatory Visit (INDEPENDENT_AMBULATORY_CARE_PROVIDER_SITE_OTHER): Payer: BC Managed Care – PPO | Admitting: Podiatry

## 2019-11-28 DIAGNOSIS — M76822 Posterior tibial tendinitis, left leg: Secondary | ICD-10-CM

## 2019-11-28 DIAGNOSIS — M19072 Primary osteoarthritis, left ankle and foot: Secondary | ICD-10-CM

## 2019-11-28 NOTE — Patient Instructions (Signed)
Pre-Operative Instructions  Congratulations, you have decided to take an important step towards improving your quality of life.  You can be assured that the doctors and staff at [facility] will be with you every step of the way.  Here are some important things you should know:  1. Plan to be at the surgery center/hospital at least 1 (one) hour prior to your scheduled time, unless otherwise directed by the surgical center/hospital staff.  You must have a responsible adult accompany you, remain during the surgery and drive you home.  Make sure you have directions to the surgical center/hospital to ensure you arrive on time. 2. If you are having surgery at [facility] or [facility] hospitals, you will need a copy of your medical history and physical form from your family physician within one month prior to the date of surgery. We will give you a form for your primary physician to complete.  3. We make every effort to accommodate the date you request for surgery.  However, there are times where surgery dates or times have to be moved.  We will contact you as soon as possible if a change in schedule is required.   4. No aspirin/ibuprofen for one week before surgery.  If you are on aspirin, any non-steroidal anti-inflammatory medications (Mobic, Aleve, Ibuprofen) should not be taken seven (7) days prior to your surgery.  You make take Tylenol for pain prior to surgery.  5. Medications - If you are taking daily heart and blood pressure medications, seizure, reflux, allergy, asthma, anxiety, pain or diabetes medications, make sure you notify the surgery center/hospital before the day of surgery so they can tell you which medications you should take or avoid the day of surgery. 6. No food or drink after midnight the night before surgery unless directed otherwise by surgical center/hospital staff. 7. No alcoholic beverages 24-hours prior to surgery.  No smoking 24-hours prior or 24-hours after  surgery. 8. Wear loose pants or shorts. They should be loose enough to fit over bandages, boots, and casts. 9. Don't wear slip-on shoes. Sneakers are preferred. 10. Bring your boot with you to the surgery center/hospital.  Also bring crutches or a walker if your physician has prescribed it for you.  If you do not have this equipment, it will be provided for you after surgery. 11. If you have not been contacted by the surgery center/hospital by the day before your surgery, call to confirm the date and time of your surgery. 12. Leave-time from work may vary depending on the type of surgery you have.  Appropriate arrangements should be made prior to surgery with your employer. 13. Prescriptions will be provided immediately following surgery by your doctor.  Fill these as soon as possible after surgery and take the medication as directed. Pain medications will not be refilled on weekends and must be approved by the doctor. 14. Remove nail polish on the operative foot and avoid getting pedicures prior to surgery. 15. Wash the night before surgery.  The night before surgery wash the foot and leg well with water and the antibacterial soap provided. Be sure to pay special attention to beneath the toenails and in between the toes.  Wash for at least three (3) minutes. Rinse thoroughly with water and dry well with a towel.  Perform this wash unless told not to do so by your physician.  Enclosed: 1 Ice pack (please put in freezer the night before surgery)   1 Hibiclens skin cleaner     Pre-op instructions  If you have any questions regarding the instructions, please do not hesitate to call our office.  [place]: [address], [place], [place] [address] -- [phone]  [place]: [address] [place], [place] [address] -- [phone]  [place]: [address], [place], [place] [address] -- [phone]   Website: [email or web address] 

## 2019-11-30 ENCOUNTER — Telehealth: Payer: Self-pay | Admitting: Podiatry

## 2019-11-30 NOTE — Telephone Encounter (Signed)
Pt coming to pick up boot but wants a note to be able to wear boot at work

## 2019-11-30 NOTE — Telephone Encounter (Signed)
Please email note to [email or web address]

## 2019-11-30 NOTE — Telephone Encounter (Signed)
Pt came by the office to pick up a toe cover/shield for his boot so that he can wear it at his job. Pts employer is needing a note from our office in order for him to be able to wear the boot while working.

## 2019-12-03 NOTE — Progress Notes (Signed)
   HPI: 48 y.o. male presenting today for follow up evaluation of left foot pain. He reports continued pain. Applying pressure to the foot increases the pain. He is ready for surgical intervention at this time. Patient is here for further evaluation and treatment.   Past Medical History:  Diagnosis Date  . Arthritis   . GERD (gastroesophageal reflux disease)   . Hypertension   . Obesity   . OSA on CPAP   . Type 2 diabetes mellitus ([place])    followed by pcp  . Wears partial dentures    lower     Physical Exam: General: The patient is alert and oriented x3 in no acute distress.  Dermatology: Skin is warm, dry and supple bilateral lower extremities. Negative for open lesions or macerations.  Vascular: Palpable pedal pulses bilaterally. No edema or erythema noted. Capillary refill within normal limits.  Neurological: Epicritic and protective threshold grossly intact bilaterally.   Musculoskeletal Exam: Pain on palpation noted to the posterior tibial tendon of the left foot with medial longitudinal arch collapse noted. Range of motion within normal limits to all pedal and ankle joints bilateral. Muscle strength 5/5 in all groups bilateral.   CT Impression from 10/19/2019: 1.  No acute osseous injury of the left ankle. 2. Mild osteoarthritis of the posterior subtalar joint. Moderate osteoarthritis of the talonavicular joint. Mild osteoarthritis of the calcaneocuboid joint. Mild osteoarthritis of the fourth tarsometatarsal joint. Mild osteoarthritis of the third tarsometatarsal joint. Mild osteoarthritis of the tibiotalar joint with small marginal osteophytes. 3. Relative pes planus.   Assessment: 1. PTTD with DJD left    Plan of Care:  1. Patient evaluated.  2. Today we discussed the conservative versus surgical management of the presenting pathology. The patient opts for surgical management. All possible complications and details of the procedure were explained. All patient  questions were answered. No guarantees were expressed or implied. 3. Authorization for surgery was initiated today. Surgery will consist of triple arthrodesis left.  4. Return to clinic one week post op.      Edrick Kins, DPM [facility]  Dr. Edrick Kins, DPM    [address], [place] [address]                Office [phone]  Fax [phone]

## 2019-12-03 NOTE — Telephone Encounter (Signed)
I called pt and asked if the requested not was for working prior to surgery or after surgery. Pt states before surgery, has been working and only wearing the boot over the weekend and it helps him, he would like to be able to wear the boot with the toe guard while working and needs a note for his employer.

## 2019-12-04 NOTE — Telephone Encounter (Signed)
Dr. Amalia Hailey communicated through [place], pt is able to work in the boot with the protective toe cap. Emailed letter to pt.

## 2020-01-16 ENCOUNTER — Telehealth: Payer: Self-pay | Admitting: *Deleted

## 2020-01-16 NOTE — Telephone Encounter (Signed)
"  I'm a patient of Dr. Amalia Hailey.  I'm supposed to be having an operation on February 18.  I'd like to hear from someone.  I don't know if I'm supposed to have a Covid test done or what all needs to be done before the surgery.  If you can please give me a call."  Your surgery is going to be at [facility].  The brochure is located in the bag that we gave you.  If a Covid test is needed, someone from the surgical center will let you know.  They will also call you a day or two prior to your surgery date and will give you your arrival time.  "Okay, thanks for the information."  "What's the address for the surgery center that I'm going to?  I see there are two locations."  The surgical center address is [address]  The address is also located on the back of that brochure that we gave you.

## 2020-01-25 ENCOUNTER — Telehealth: Payer: Self-pay | Admitting: Podiatry

## 2020-01-25 NOTE — Telephone Encounter (Signed)
DOS: 01/31/2020  SURGICAL PROCEDURE: Triple Arthrodesis Left  Dr. Posey Pronto Assisting in [place] Effective : 67/12/4101  -  12/12/2198  Member Liability Summary  In-Network   Max Per Benefit Period Year-to-Date Remaining     CoInsurance 20%       Deductible  $3000.00 $3000.00     Out-Of-Pocket 3 $4500.00 $4500.00 3 Out-of-Pocket includes copay, deductible, and coinsurance.  [place] Required Not Applicable 01%  per  Service Year No

## 2020-01-28 ENCOUNTER — Other Ambulatory Visit: Payer: Self-pay | Admitting: Podiatry

## 2020-01-28 DIAGNOSIS — M25572 Pain in left ankle and joints of left foot: Secondary | ICD-10-CM

## 2020-01-28 DIAGNOSIS — M76822 Posterior tibial tendinitis, left leg: Secondary | ICD-10-CM

## 2020-01-28 NOTE — Addendum Note (Signed)
Addended by: [place] Mon R on: 01/28/2020 11:49 AM   Modules accepted: Orders

## 2020-01-28 NOTE — Addendum Note (Signed)
Addended by: [place] Mon R on: 01/28/2020 11:24 AM   Modules accepted: Orders

## 2020-02-06 ENCOUNTER — Encounter: Payer: BC Managed Care – PPO | Admitting: Podiatry

## 2020-02-06 DIAGNOSIS — M76822 Posterior tibial tendinitis, left leg: Secondary | ICD-10-CM | POA: Diagnosis not present

## 2020-02-08 ENCOUNTER — Other Ambulatory Visit: Payer: Self-pay | Admitting: Podiatry

## 2020-02-08 ENCOUNTER — Telehealth: Payer: Self-pay | Admitting: Podiatry

## 2020-02-08 DIAGNOSIS — M25375 Other instability, left foot: Secondary | ICD-10-CM | POA: Diagnosis not present

## 2020-02-08 DIAGNOSIS — M25572 Pain in left ankle and joints of left foot: Secondary | ICD-10-CM | POA: Diagnosis not present

## 2020-02-08 DIAGNOSIS — M2142 Flat foot [pes planus] (acquired), left foot: Secondary | ICD-10-CM | POA: Diagnosis not present

## 2020-02-08 DIAGNOSIS — I10 Essential (primary) hypertension: Secondary | ICD-10-CM | POA: Diagnosis not present

## 2020-02-08 DIAGNOSIS — M19072 Primary osteoarthritis, left ankle and foot: Secondary | ICD-10-CM | POA: Diagnosis not present

## 2020-02-08 MED ORDER — DOXYCYCLINE HYCLATE 100 MG PO TABS: 100.0000 mg | ORAL_TABLET | Freq: Two times a day (BID) | ORAL | 0 refills | Status: DC

## 2020-02-08 MED ORDER — OXYCODONE-ACETAMINOPHEN 10-325 MG PO TABS: 1.0000 | ORAL_TABLET | Freq: Four times a day (QID) | ORAL | 0 refills | Status: AC | PRN

## 2020-02-08 NOTE — Addendum Note (Signed)
Addended by: Boneta Lucks on: 02/08/2020 04:22 PM   Modules accepted: Orders

## 2020-02-08 NOTE — Telephone Encounter (Signed)
[facility] [place] states hardcopy of Percocet 10/386m #21 one tablet every 8 hours, doxycycline 1058m#14 one capsule bid.

## 2020-02-08 NOTE — Telephone Encounter (Signed)
Pt had handwritten script that was sent with patient to pharmacy the [facility] on [address] will not take hand written medications pt dos 02/08/20

## 2020-02-08 NOTE — Telephone Encounter (Signed)
I told pt I would call [phone] to get the orders and have one of the in-office doctors escribe.

## 2020-02-08 NOTE — Progress Notes (Unsigned)
PRN postop 

## 2020-02-08 NOTE — Telephone Encounter (Signed)
Pt had surgery today and was given hand written prescriptions for Percocet and Doxycycline. Pharmacy is unable to accept it in written form and will need the medications sent electronically.  Please send both prescriptions to [facility] on [address]

## 2020-02-13 ENCOUNTER — Encounter: Payer: BC Managed Care – PPO | Admitting: Podiatry

## 2020-02-18 ENCOUNTER — Ambulatory Visit (INDEPENDENT_AMBULATORY_CARE_PROVIDER_SITE_OTHER): Payer: BC Managed Care – PPO | Admitting: Podiatry

## 2020-02-18 ENCOUNTER — Other Ambulatory Visit: Payer: Self-pay

## 2020-02-18 ENCOUNTER — Ambulatory Visit (INDEPENDENT_AMBULATORY_CARE_PROVIDER_SITE_OTHER): Payer: BC Managed Care – PPO

## 2020-02-18 VITALS — Temp 97.8°F

## 2020-02-18 DIAGNOSIS — M76822 Posterior tibial tendinitis, left leg: Secondary | ICD-10-CM | POA: Diagnosis not present

## 2020-02-18 DIAGNOSIS — Z9889 Other specified postprocedural states: Secondary | ICD-10-CM

## 2020-02-18 MED ORDER — OXYCODONE-ACETAMINOPHEN 5-325 MG PO TABS: 1.0000 | ORAL_TABLET | Freq: Four times a day (QID) | ORAL | 0 refills | Status: DC | PRN

## 2020-02-18 MED ORDER — DOXYCYCLINE HYCLATE 100 MG PO TABS: 100.0000 mg | ORAL_TABLET | Freq: Two times a day (BID) | ORAL | 0 refills | Status: DC

## 2020-02-20 ENCOUNTER — Telehealth: Payer: Self-pay

## 2020-02-20 NOTE — Telephone Encounter (Signed)
Dr. Amalia Hailey states give pt 6 month handicap sticker.

## 2020-02-20 NOTE — Telephone Encounter (Signed)
Pt called stating that he just had major surgery with Dr. Amalia Hailey and wanted to know if he could get a handicap parking permit.

## 2020-02-21 NOTE — Progress Notes (Signed)
   Subjective:  Patient presents today status post triple arthrodesis left. DOS: 02/08/2020. He states he is doing well overall. He reports some moderate pain that is exacerbated by applying pressure to the area. He has been using the CAM boot, taking Percocet and Doxycycline as directed. Patient is here for further evaluation and treatment.    Past Medical History:  Diagnosis Date  . Arthritis   . GERD (gastroesophageal reflux disease)   . Hypertension   . Obesity   . OSA on CPAP   . Type 2 diabetes mellitus ([place])    followed by pcp  . Wears partial dentures    lower      Objective/Physical Exam Neurovascular status intact.  Skin incisions appear to be well coapted with sutures and staples intact. No sign of infectious process noted. No dehiscence. No active bleeding noted. Moderate edema noted to the surgical extremity.  Radiographic Exam:  Orthopedic hardware and osteotomies sites appear to be stable with routine healing.  Assessment: 1. s/p triple arthrodesis left. DOS: 02/08/2020   Plan of Care:  1. Patient was evaluated. X-rays reviewed 2. Dressing changed. Drain removed.  3. Refill prescription for Percocet 5/325 mg provided to patient.  4. Refill prescription for Doxycycline 100 mg #20 BID provided to patient.  5. Continue nonweightbearing in CAM boot.  6. Return to clinic in one week.    Edrick Kins, DPM [facility]  Dr. Edrick Kins, [place]                                        [place], [place] [address]                Office [phone]  Fax [phone]

## 2020-02-25 ENCOUNTER — Ambulatory Visit (INDEPENDENT_AMBULATORY_CARE_PROVIDER_SITE_OTHER): Payer: BC Managed Care – PPO | Admitting: Podiatry

## 2020-02-25 ENCOUNTER — Other Ambulatory Visit: Payer: Self-pay

## 2020-02-25 DIAGNOSIS — Z9889 Other specified postprocedural states: Secondary | ICD-10-CM

## 2020-02-25 DIAGNOSIS — M76822 Posterior tibial tendinitis, left leg: Secondary | ICD-10-CM

## 2020-02-25 MED ORDER — OXYCODONE-ACETAMINOPHEN 5-325 MG PO TABS: 1.0000 | ORAL_TABLET | Freq: Four times a day (QID) | ORAL | 0 refills | Status: DC | PRN

## 2020-02-27 ENCOUNTER — Encounter: Payer: BC Managed Care – PPO | Admitting: Podiatry

## 2020-02-28 NOTE — Progress Notes (Signed)
   Subjective:  Patient presents today status post triple arthrodesis left. DOS: 02/08/2020. He states he is doing well. He reports some mild intermittent pain to the dorsal midfoot. He reports some associated selling. He has been taking the Doxycycline and using the CAM boot as directed. Patient is here for further evaluation and treatment.    Past Medical History:  Diagnosis Date  . Arthritis   . GERD (gastroesophageal reflux disease)   . Hypertension   . Obesity   . OSA on CPAP   . Type 2 diabetes mellitus ([place])    followed by pcp  . Wears partial dentures    lower      Objective/Physical Exam Neurovascular status intact.  Skin incisions appear to be well coapted with sutures and staples intact. No sign of infectious process noted. No dehiscence. No active bleeding noted. Moderate edema noted to the surgical extremity.  Assessment: 1. s/p triple arthrodesis left. DOS: 02/08/2020   Plan of Care:  1. Patient was evaluated.  2. Staples and sutures removed.  3. Continue nonweightbearing in CAM boot.  4. Refill prescription for Percocet 5/325 mg provided to patient.  5. Finish taking Doxycycline as prescribed.  6. Return to clinic in 4 weeks for follow up X-Rays.    Edrick Kins, DPM [facility]  Dr. Edrick Kins, [address]                                        [place], [place] [address]                Office [phone]  Fax [phone]

## 2020-03-05 DIAGNOSIS — M76822 Posterior tibial tendinitis, left leg: Secondary | ICD-10-CM | POA: Diagnosis not present

## 2020-03-10 ENCOUNTER — Ambulatory Visit (INDEPENDENT_AMBULATORY_CARE_PROVIDER_SITE_OTHER): Payer: BC Managed Care – PPO | Admitting: Podiatry

## 2020-03-10 ENCOUNTER — Ambulatory Visit (INDEPENDENT_AMBULATORY_CARE_PROVIDER_SITE_OTHER): Payer: BC Managed Care – PPO

## 2020-03-10 ENCOUNTER — Other Ambulatory Visit: Payer: Self-pay

## 2020-03-10 DIAGNOSIS — Z9889 Other specified postprocedural states: Secondary | ICD-10-CM

## 2020-03-10 DIAGNOSIS — M76822 Posterior tibial tendinitis, left leg: Secondary | ICD-10-CM | POA: Diagnosis not present

## 2020-03-12 NOTE — Progress Notes (Signed)
   Subjective:  Patient presents today status post triple arthrodesis left. DOS: 02/08/2020. He states he is doing well. He reports some mild associated swelling that is alleviated with elevation of the foot. He reports some mild "sensitivity" around the incision site. He has been using the CAM boot as directed. There are no worsening factors noted. Patient is here for further evaluation and treatment.    Past Medical History:  Diagnosis Date  . Arthritis   . GERD (gastroesophageal reflux disease)   . Hypertension   . Obesity   . OSA on CPAP   . Type 2 diabetes mellitus ([place])    followed by pcp  . Wears partial dentures    lower      Objective/Physical Exam Neurovascular status intact.  Skin incisions appear to be well coapted. No sign of infectious process noted. No dehiscence. No active bleeding noted. Moderate edema noted to the surgical extremity.  Radiographic Exam:  Orthopedic hardware and osteotomies sites appear to be stable with routine healing.  Assessment: 1. s/p triple arthrodesis left. DOS: 02/08/2020   Plan of Care:  1. Patient was evaluated. X-Rays reviewed.  2. Continue nonweightbearing in CAM boot with crutches. 3. Ace wraps dispensed.  4. Return to clinic in 4 weeks to initiate physical therapy and weightbearing.   Daughter coming in for flatfoot consult.     Edrick Kins, DPM [facility]  Dr. Edrick Kins, [address]                                        [place], [place] [address]                Office [phone]  Fax [phone]

## 2020-04-05 DIAGNOSIS — M76822 Posterior tibial tendinitis, left leg: Secondary | ICD-10-CM | POA: Diagnosis not present

## 2020-04-07 ENCOUNTER — Other Ambulatory Visit: Payer: Self-pay

## 2020-04-07 ENCOUNTER — Ambulatory Visit (INDEPENDENT_AMBULATORY_CARE_PROVIDER_SITE_OTHER): Payer: BC Managed Care – PPO | Admitting: Podiatry

## 2020-04-07 DIAGNOSIS — Z9889 Other specified postprocedural states: Secondary | ICD-10-CM

## 2020-04-07 DIAGNOSIS — M76822 Posterior tibial tendinitis, left leg: Secondary | ICD-10-CM

## 2020-04-10 NOTE — Progress Notes (Signed)
   Subjective:  Patient presents today status post triple arthrodesis left. DOS: 02/08/2020. He states he is doing well. He has been using the crutches and the CAM boot as directed. He denies any worsening factors at this time. Patient is here for further evaluation and treatment.    Past Medical History:  Diagnosis Date  . Arthritis   . GERD (gastroesophageal reflux disease)   . Hypertension   . Obesity   . OSA on CPAP   . Type 2 diabetes mellitus ([place])    followed by pcp  . Wears partial dentures    lower      Objective/Physical Exam Neurovascular status intact.  Skin incisions appear to be well coapted. No sign of infectious process noted. No dehiscence. No active bleeding noted. Moderate edema noted to the surgical extremity.  Assessment: 1. s/p triple arthrodesis left. DOS: 02/08/2020   Plan of Care:  1. Patient was evaluated. 2. Physical therapy ordered at [facility].  3. Transition to full weightbearing over the next month in CAM boot.  4. Appointment with Pedorthist for new custom molded orthotics.  5. Return to clinic in 4 weeks.   Daughter coming in for flatfoot consult.     Edrick Kins, DPM [facility]  Dr. Edrick Kins, [address]                                        [place], [place] [address]                Office [phone]  Fax [phone]

## 2020-04-15 ENCOUNTER — Other Ambulatory Visit: Payer: BC Managed Care – PPO | Admitting: Orthotics

## 2020-04-21 ENCOUNTER — Ambulatory Visit: Payer: Self-pay | Admitting: Orthotics

## 2020-04-21 ENCOUNTER — Other Ambulatory Visit: Payer: Self-pay

## 2020-04-21 DIAGNOSIS — M76822 Posterior tibial tendinitis, left leg: Secondary | ICD-10-CM

## 2020-04-21 NOTE — Progress Notes (Signed)
Decided to forgo getting f/o until he can put more wt on his foot.

## 2020-05-01 DIAGNOSIS — M25672 Stiffness of left ankle, not elsewhere classified: Secondary | ICD-10-CM | POA: Diagnosis not present

## 2020-05-01 DIAGNOSIS — R2689 Other abnormalities of gait and mobility: Secondary | ICD-10-CM | POA: Diagnosis not present

## 2020-05-01 DIAGNOSIS — M25572 Pain in left ankle and joints of left foot: Secondary | ICD-10-CM | POA: Diagnosis not present

## 2020-05-06 ENCOUNTER — Telehealth: Payer: Self-pay | Admitting: Podiatry

## 2020-05-06 NOTE — Telephone Encounter (Signed)
Patient called saying Joseph Wood is requesting all medical records from 02-01 - Present where he is trying to get permanent disability. If any questions, please call patient.

## 2020-05-13 ENCOUNTER — Telehealth: Payer: Self-pay | Admitting: *Deleted

## 2020-05-13 NOTE — Telephone Encounter (Signed)
Patient is requesting a sooner appointment( scheduled for June 30)  to see Dr. Amalia Hailey, said that he was suppose to be scheduled in May.  Please call.

## 2020-06-02 ENCOUNTER — Encounter: Payer: Self-pay | Admitting: Podiatry

## 2020-06-02 ENCOUNTER — Ambulatory Visit (INDEPENDENT_AMBULATORY_CARE_PROVIDER_SITE_OTHER): Payer: 59 | Admitting: Podiatry

## 2020-06-02 ENCOUNTER — Other Ambulatory Visit: Payer: Self-pay

## 2020-06-02 DIAGNOSIS — M76822 Posterior tibial tendinitis, left leg: Secondary | ICD-10-CM

## 2020-06-02 DIAGNOSIS — M19072 Primary osteoarthritis, left ankle and foot: Secondary | ICD-10-CM | POA: Diagnosis not present

## 2020-06-02 DIAGNOSIS — Z9889 Other specified postprocedural states: Secondary | ICD-10-CM

## 2020-06-02 NOTE — Progress Notes (Signed)
° °  Subjective:  Patient presents today status post triple arthrodesis left. DOS: 02/08/2020. He states he is doing well. Patient has been weight bearing in good supportive tennis shoes and going to physical therapy 2x/week. He has noticed significant improvement. He currently does not have a specific return to work date but is concerned because his short-term disability has not transferred over to his long-term disability and he is currently not getting a paycheck and he is running out of money quickly.  Patient is very concerned financially.  Past Medical History:  Diagnosis Date   Arthritis    GERD (gastroesophageal reflux disease)    Hypertension    Obesity    OSA on CPAP    Type 2 diabetes mellitus ([place])    followed by pcp   Wears partial dentures    lower      Objective/Physical Exam Neurovascular status intact.  Skin incisions are healed.  Negative for any significant pain on palpation to the surgical area.  Negative for range of motion to the tarsal joints.  There is some moderate edema noted to the foot.  No sign of infectious process.  Assessment: 1. s/p triple arthrodesis left. DOS: 02/08/2020   Plan of Care:  1. Patient was evaluated. 2.  Continue physical therapy 2 times per week at [facility] 3.  Compression ankle sleeves dispensed today 4.  Today I arranged for the patient to discuss his disability with our disability coordinator.  Apparently the patient's long-term disability would like the physical therapy progress notes for approval. 5.  Return to clinic in 6 weeks for follow-up x-ray and discuss possible return to work date  Edrick Kins, DPM [facility]  Dr. Edrick Kins, [address]                                        [place], [place] [address]                Office [phone]  Fax [phone]

## 2020-06-10 ENCOUNTER — Encounter: Payer: Self-pay | Admitting: Internal Medicine

## 2020-06-10 ENCOUNTER — Ambulatory Visit: Payer: BC Managed Care – PPO | Admitting: Internal Medicine

## 2020-06-10 ENCOUNTER — Other Ambulatory Visit: Payer: Self-pay

## 2020-06-10 ENCOUNTER — Ambulatory Visit (INDEPENDENT_AMBULATORY_CARE_PROVIDER_SITE_OTHER): Payer: 59 | Admitting: Internal Medicine

## 2020-06-10 VITALS — BP 140/90 | HR 68 | Temp 98.2°F | Resp 16 | Ht 69.0 in | Wt 347.0 lb

## 2020-06-10 DIAGNOSIS — Z9884 Bariatric surgery status: Secondary | ICD-10-CM | POA: Diagnosis not present

## 2020-06-10 DIAGNOSIS — E5111 Dry beriberi: Secondary | ICD-10-CM | POA: Diagnosis not present

## 2020-06-10 DIAGNOSIS — K219 Gastro-esophageal reflux disease without esophagitis: Secondary | ICD-10-CM

## 2020-06-10 DIAGNOSIS — E559 Vitamin D deficiency, unspecified: Secondary | ICD-10-CM | POA: Insufficient documentation

## 2020-06-10 DIAGNOSIS — I10 Essential (primary) hypertension: Secondary | ICD-10-CM | POA: Diagnosis not present

## 2020-06-10 DIAGNOSIS — Z0000 Encounter for general adult medical examination without abnormal findings: Secondary | ICD-10-CM | POA: Diagnosis not present

## 2020-06-10 DIAGNOSIS — Z6841 Body Mass Index (BMI) 40.0 and over, adult: Secondary | ICD-10-CM

## 2020-06-10 DIAGNOSIS — H9313 Tinnitus, bilateral: Secondary | ICD-10-CM

## 2020-06-10 DIAGNOSIS — R195 Other fecal abnormalities: Secondary | ICD-10-CM | POA: Insufficient documentation

## 2020-06-10 DIAGNOSIS — E785 Hyperlipidemia, unspecified: Secondary | ICD-10-CM

## 2020-06-10 DIAGNOSIS — E118 Type 2 diabetes mellitus with unspecified complications: Secondary | ICD-10-CM

## 2020-06-10 DIAGNOSIS — Z23 Encounter for immunization: Secondary | ICD-10-CM

## 2020-06-10 LAB — BASIC METABOLIC PANEL
BUN: 15 mg/dL (ref 6–23)
CO2: 31 mEq/L (ref 19–32)
Calcium: 9.4 mg/dL (ref 8.4–10.5)
Chloride: 107 mEq/L (ref 96–112)
Creatinine, Ser: 0.94 mg/dL (ref 0.40–1.50)
GFR: 85.31 mL/min (ref >60.00)
Glucose, Bld: 99 mg/dL (ref 70–99)
Potassium: 4.3 mEq/L (ref 3.5–5.1)
Sodium: 144 mEq/L (ref 135–145)

## 2020-06-10 LAB — CBC WITH DIFFERENTIAL/PLATELET
Basophils Absolute: 0 10*3/uL (ref 0.0–0.1)
Basophils Relative: 0.3 % (ref 0.0–3.0)
Eosinophils Absolute: 0.1 10*3/uL (ref 0.0–0.7)
Eosinophils Relative: 1.6 % (ref 0.0–5.0)
HCT: 43.6 % (ref 39.0–52.0)
Hemoglobin: 14.7 g/dL (ref 13.0–17.0)
Lymphocytes Relative: 33.5 % (ref 12.0–46.0)
Lymphs Abs: 1.8 10*3/uL (ref 0.7–4.0)
MCHC: 33.7 g/dL (ref 30.0–36.0)
MCV: 90.1 fl (ref 78.0–100.0)
Monocytes Absolute: 0.4 10*3/uL (ref 0.1–1.0)
Monocytes Relative: 7.6 % (ref 3.0–12.0)
Neutro Abs: 3.1 10*3/uL (ref 1.4–7.7)
Neutrophils Relative %: 57 % (ref 43.0–77.0)
Platelets: 215 10*3/uL (ref 150.0–400.0)
RBC: 4.84 Mil/uL (ref 4.22–5.81)
RDW: 14 % (ref 11.5–15.5)
WBC: 5.4 10*3/uL (ref 4.0–10.5)

## 2020-06-10 LAB — URINALYSIS, ROUTINE W REFLEX MICROSCOPIC
Bilirubin Urine: NEGATIVE
Hgb urine dipstick: NEGATIVE
Ketones, ur: NEGATIVE
Leukocytes,Ua: NEGATIVE
Nitrite: NEGATIVE
Specific Gravity, Urine: 1.025 (ref 1.000–1.030)
Total Protein, Urine: NEGATIVE
Urine Glucose: NEGATIVE
Urobilinogen, UA: 0.2 (ref 0.0–1.0)
pH: 5.5 (ref 5.0–8.0)

## 2020-06-10 LAB — HEPATIC FUNCTION PANEL
ALT: 15 U/L (ref 0–53)
AST: 17 U/L (ref 0–37)
Albumin: 4.3 g/dL (ref 3.5–5.2)
Alkaline Phosphatase: 77 U/L (ref 39–117)
Bilirubin, Direct: 0.2 mg/dL (ref 0.0–0.3)
Total Bilirubin: 0.8 mg/dL (ref 0.2–1.2)
Total Protein: 6.8 g/dL (ref 6.0–8.3)

## 2020-06-10 LAB — LIPID PANEL
Cholesterol: 166 mg/dL (ref 0–200)
HDL: 51.4 mg/dL (ref >39.00)
LDL Cholesterol: 98 mg/dL (ref 0–99)
NonHDL: 114.65
Total CHOL/HDL Ratio: 3
Triglycerides: 83 mg/dL (ref 0.0–149.0)
VLDL: 16.6 mg/dL (ref 0.0–40.0)

## 2020-06-10 LAB — MICROALBUMIN / CREATININE URINE RATIO
Creatinine,U: 151.1 mg/dL
Microalb Creat Ratio: 0.5 mg/g (ref 0.0–30.0)
Microalb, Ur: <0.7 mg/dL (ref 0.0–1.9)

## 2020-06-10 LAB — IBC PANEL
Iron: 98 ug/dL (ref 42–165)
Saturation Ratios: 24.5 % (ref 20.0–50.0)
Transferrin: 286 mg/dL (ref 212.0–360.0)

## 2020-06-10 LAB — POCT GLYCOSYLATED HEMOGLOBIN (HGB A1C): Hemoglobin A1C: 5.5 % (ref 4.0–5.6)

## 2020-06-10 LAB — PSA: PSA: 0.74 ng/mL (ref 0.10–4.00)

## 2020-06-10 LAB — VITAMIN B12: Vitamin B-12: 366 pg/mL (ref 211–911)

## 2020-06-10 LAB — VITAMIN D 25 HYDROXY (VIT D DEFICIENCY, FRACTURES): VITD: 24.81 ng/mL — ABNORMAL LOW (ref 30.00–100.00)

## 2020-06-10 LAB — FOLATE: Folate: >24.8 ng/mL (ref >5.9)

## 2020-06-10 LAB — TSH: TSH: 2.38 u[IU]/mL (ref 0.35–4.50)

## 2020-06-10 LAB — FERRITIN: Ferritin: 16.1 ng/mL — ABNORMAL LOW (ref 22.0–322.0)

## 2020-06-10 MED ORDER — SAXENDA 18 MG/3ML ~~LOC~~ SOPN: 3.0000 mg | PEN_INJECTOR | Freq: Every day | SUBCUTANEOUS | 5 refills | Status: DC

## 2020-06-10 MED ORDER — CHOLECALCIFEROL 50 MCG (2000 UT) PO TABS: 1.0000 | ORAL_TABLET | Freq: Every day | ORAL | 1 refills | Status: DC

## 2020-06-10 MED ORDER — NOVOFINE 32G X 6 MM MISC: 1.0000 | Freq: Every day | 1 refills | Status: DC

## 2020-06-10 NOTE — Patient Instructions (Signed)

## 2020-06-10 NOTE — Progress Notes (Signed)
Subjective:  Patient ID: Joseph Wood., male    DOB: 06-09-1971  Age: 49 y.o. MRN: [id number]  CC: Annual Exam  This visit occurred during the SARS-CoV-2 public health emergency.  Safety protocols were in place, including screening questions prior to the visit, additional usage of staff PPE, and extensive cleaning of exam room while observing appropriate contact time as indicated for disinfecting solutions.    HPI Joseph Wood. presents for a CPX.  He complains of ringing in his ears for greater than 1 year.  He describes it as an annoying buzzing sensation.  He does not think it is associated with loss of hearing.  He is complains of weight gain.  He had an orthopedic surgery several months ago and has been sedentary since then.  He does not think it has affected his blood sugar as he denies polys.  He does not monitor his blood pressure or his blood sugars.  He is 5 years status post gastric bypass surgery.  He is taking a multivitamin but no other dedicated vitamin supplements.  Outpatient Medications Prior to Visit  Medication Sig Dispense Refill   blood glucose meter kit and supplies KIT Use to test blood sugar daily. DX E11.8 1 each 0   Ferrous Sulfate (IRON PO) Take 1 tablet by mouth daily.     glucose blood (COOL BLOOD GLUCOSE TEST STRIPS) test strip Use to test blood sugar daily. DX E11.8 100 each 3   Lancets MISC Use to test blood sugar daily. DX E11.8 100 each 3   Multiple Vitamins-Minerals (BARIATRIC MULTIVITAMINS/IRON PO) Take 1 tablet by mouth daily.     thiamine (VITAMIN B-1) 100 MG tablet Take 1 tablet (100 mg total) by mouth daily. 90 tablet 1   aspirin EC 81 MG tablet Take 81 mg by mouth daily.     oxyCODONE-acetaminophen (PERCOCET) 5-325 MG tablet Take 1 tablet by mouth every 6 (six) hours as needed for severe pain. 30 tablet 0   doxycycline (VIBRA-TABS) 100 MG tablet Take 1 tablet (100 mg total) by mouth 2 (two) times daily. 20 tablet 0    No facility-administered medications prior to visit.    ROS Review of Systems  Constitutional: Positive for unexpected weight change (wt gain). Negative for appetite change, chills, diaphoresis and fatigue.  HENT: Positive for tinnitus. Negative for hearing loss and trouble swallowing.   Eyes: Negative for visual disturbance.  Respiratory: Negative for cough, chest tightness, shortness of breath and wheezing.   Cardiovascular: Negative for chest pain, palpitations and leg swelling.  Gastrointestinal: Negative for abdominal pain, blood in stool, constipation, diarrhea, nausea and vomiting.  Endocrine: Negative for cold intolerance, heat intolerance, polydipsia, polyphagia and polyuria.  Genitourinary: Negative.  Negative for difficulty urinating, discharge, scrotal swelling and testicular pain.  Musculoskeletal: Negative.  Negative for arthralgias, back pain and myalgias.  Skin: Negative.  Negative for color change, pallor and rash.  Neurological: Negative.  Negative for dizziness, weakness, light-headedness, numbness and headaches.  Hematological: Negative for adenopathy. Does not bruise/bleed easily.  Psychiatric/Behavioral: Negative.     Objective:  BP 140/90 (BP Location: Left Arm, Patient Position: Sitting, Cuff Size: Large)    Pulse 68    Temp 98.2 F (36.8 C) (Oral)    Resp 16    Ht 5' 9"  (1.753 m)    Wt (!) 347 lb (157.4 kg)    SpO2 98%    BMI 51.24 kg/m   BP Readings from Last 3 Encounters:  06/10/20  140/90  09/07/18 118/76  07/03/18 127/75    Wt Readings from Last 3 Encounters:  06/10/20 (!) 347 lb (157.4 kg)  06/13/19 (!) 305 lb 3.2 oz (138.4 kg)  01/25/19 (!) 306 lb 12.8 oz (139.2 kg)    Physical Exam Vitals reviewed. Exam conducted with a chaperone present Joseph Wood).  Constitutional:      Appearance: He is obese. He is not ill-appearing or diaphoretic.  HENT:     Nose: Nose normal.     Mouth/Throat:     Mouth: Mucous membranes are moist.  Eyes:      General: No scleral icterus.    Conjunctiva/sclera: Conjunctivae normal.  Cardiovascular:     Rate and Rhythm: Normal rate and regular rhythm.     Heart sounds: No murmur heard.      Comments: EKG - NSR, 61 bmp Normal EKG Pulmonary:     Effort: Pulmonary effort is normal.     Breath sounds: No stridor. No wheezing, rhonchi or rales.  Abdominal:     General: Abdomen is protuberant. Bowel sounds are normal. There is no distension.     Palpations: Abdomen is soft. There is no hepatomegaly, splenomegaly or mass.     Tenderness: There is no abdominal tenderness.     Hernia: No hernia is present. There is no hernia in the left inguinal area or right inguinal area.  Genitourinary:    Pubic Area: No rash.      Penis: Normal. No discharge, swelling or lesions.      Testes: Normal.     Epididymis:     Right: Normal.     Left: Normal.     Prostate: Normal. Not enlarged, not tender and no nodules present.     Rectum: Normal. Guaiac result positive. No mass, tenderness, anal fissure, external hemorrhoid or internal hemorrhoid. Normal anal tone.  Musculoskeletal:        General: Normal range of motion.     Cervical back: Neck supple.     Right lower leg: No edema.     Left lower leg: No edema.  Lymphadenopathy:     Cervical: No cervical adenopathy.     Lower Body: No right inguinal adenopathy. No left inguinal adenopathy.  Skin:    General: Skin is warm and dry.     Findings: No rash.  Neurological:     General: No focal deficit present.     Mental Status: He is alert and oriented to person, place, and time. Mental status is at baseline.  Psychiatric:        Mood and Affect: Mood normal.        Behavior: Behavior normal.     Lab Results  Component Value Date   WBC 5.4 06/10/2020   HGB 14.7 06/10/2020   HCT 43.6 06/10/2020   PLT 215.0 06/10/2020   GLUCOSE 99 06/10/2020   CHOL 166 06/10/2020   TRIG 83.0 06/10/2020   HDL 51.40 06/10/2020   LDLCALC 98 06/10/2020   ALT 15  06/10/2020   AST 17 06/10/2020   NA 144 06/10/2020   K 4.3 06/10/2020   CL 107 06/10/2020   CREATININE 0.94 06/10/2020   BUN 15 06/10/2020   CO2 31 06/10/2020   TSH 2.38 06/10/2020   PSA 0.74 06/10/2020   HGBA1C 5.5 06/10/2020   MICROALBUR <0.7 06/10/2020    CT ANKLE LEFT WO CONTRAST  Result Date: 10/20/2019 CLINICAL DATA:  Left ankle pain and swelling. Unable to flex the toes. EXAM: CT OF  THE LEFT ANKLE WITHOUT CONTRAST TECHNIQUE: Multidetector CT imaging of the left ankle was performed according to the standard protocol. Multiplanar CT image reconstructions were also generated. COMPARISON:  None. FINDINGS: Bones/Joint/Cartilage No fracture or dislocation. Normal alignment. Small joint effusion. Mild osteoarthritis of the posterior subtalar joint. Moderate osteoarthritis of the talonavicular joint. Mild osteoarthritis of the calcaneocuboid joint. Mild osteoarthritis of the fourth tarsometatarsal joint. Mild osteoarthritis of the third tarsometatarsal joint. Mild osteoarthritis of the tibiotalar joint with small marginal osteophytes. Relative pes planus.  Small plantar calcaneal spur. Ligaments Ligaments are suboptimally evaluated by CT. Muscles and Tendons Muscles are normal. No muscle atrophy. Flexor compartment tendons are not well visualized. Achilles tendon is grossly intact. Peroneal tendons are grossly intact. Soft tissue No fluid collection or hematoma.  No soft tissue mass. IMPRESSION: 1.  No acute osseous injury of the left ankle. 2. Mild osteoarthritis of the posterior subtalar joint. Moderate osteoarthritis of the talonavicular joint. Mild osteoarthritis of the calcaneocuboid joint. Mild osteoarthritis of the fourth tarsometatarsal joint. Mild osteoarthritis of the third tarsometatarsal joint. Mild osteoarthritis of the tibiotalar joint with small marginal osteophytes. 3. Relative pes planus. Electronically Signed   By: Kathreen Devoid   On: 10/20/2019 13:18    Assessment & Plan:    Garnell was seen today for annual exam.  Diagnoses and all orders for this visit:  HYPERTENSION, BENIGN ESSENTIAL- His blood pressure is not adequately well controlled.  His labs are negative for secondary causes or endorgan damage.  His EKG is negative for LVH or ischemia.  In addition to lifestyle modifications I recommended that he start taking an ARB. -     Basic metabolic panel; Future -     TSH; Future -     Urinalysis, Routine w reflex microscopic; Future -     VITAMIN D 25 Hydroxy (Vit-D Deficiency, Fractures); Future -     CBC with Differential/Platelet; Future -     EKG 12-Lead -     CBC with Differential/Platelet -     Urinalysis, Routine w reflex microscopic -     VITAMIN D 25 Hydroxy (Vit-D Deficiency, Fractures) -     TSH -     Basic metabolic panel -     irbesartan (AVAPRO) 150 MG tablet; Take 1 tablet (150 mg total) by mouth daily.  Type II diabetes mellitus with manifestations ([place])- His blood sugars are normal status post gastric bypass. -     Basic metabolic panel; Future -     Microalbumin / creatinine urine ratio; Future -     Ambulatory referral to Ophthalmology -     Insulin Pen Needle (NOVOFINE) 32G X 6 MM MISC; 1 Act by Does not apply route daily. -     POCT glycosylated hemoglobin (Hb A1C) -     Microalbumin / creatinine urine ratio -     Basic metabolic panel -     HM Diabetes Foot Exam -     irbesartan (AVAPRO) 150 MG tablet; Take 1 tablet (150 mg total) by mouth daily.  Thiamine deficiency neuropathy- I will monitor his thiamine level. -     Vitamin B1; Future -     CBC with Differential/Platelet; Future -     CBC with Differential/Platelet -     Vitamin B1  Routine general medical examination at a health care facility- Exam completed, labs reviewed, vaccines reviewed and updated, he is referred for colon cancer screening, patient education was given. -     Lipid panel;  Future -     PSA; Future -     PSA -     Lipid panel  Hyperlipidemia with  target LDL less than 100- His ASCVD risk is only 4.6%.  Statin therapy is not indicated. -     Hepatic function panel; Future -     Hepatic function panel  Morbid obesity with BMI of 50.0-59.9, adult ([place])- In addition to lifestyle modifications I recommended that he use a high-dose GLP-1 agonist to help treat the obesity. -     Liraglutide -Weight Management (SAXENDA) 18 MG/3ML SOPN; Inject 0.5 mLs (3 mg total) into the skin daily. -     Insulin Pen Needle (NOVOFINE) 32G X 6 MM MISC; 1 Act by Does not apply route daily.  Gastroesophageal reflux disease without esophagitis- He has had no complications related to this.  History of gastric bypass- I will monitor him for vitamin and mineral deficiencies. -     Vitamin B12; Future -     IBC panel; Future -     Folate; Future -     Ferritin; Future -     VITAMIN D 25 Hydroxy (Vit-D Deficiency, Fractures); Future -     Zinc; Future -     Zinc -     VITAMIN D 25 Hydroxy (Vit-D Deficiency, Fractures) -     Ferritin -     Folate -     IBC panel -     Vitamin B12 -     Cholecalciferol 50 MCG (2000 UT) TABS; Take 1 tablet (2,000 Units total) by mouth daily.  Positive occult stool blood test -     Ambulatory referral to Gastroenterology  Need for Tdap vaccination -     Tdap vaccine greater than or equal to 7yo IM  Tinnitus aurium, bilateral -     Ambulatory referral to ENT  Vitamin D deficiency disease -     Cholecalciferol 50 MCG (2000 UT) TABS; Take 1 tablet (2,000 Units total) by mouth daily.   I have discontinued Joseph J. Kistner Jr.'s aspirin EC, doxycycline, and oxyCODONE-acetaminophen. I am also having him start on Saxenda, NovoFine, Cholecalciferol, and irbesartan. Additionally, I am having him maintain his Multiple Vitamins-Minerals (BARIATRIC MULTIVITAMINS/IRON PO), thiamine, blood glucose meter kit and supplies, glucose blood, Lancets, and Ferrous Sulfate (IRON PO).  Meds ordered this encounter  Medications   Liraglutide  -Weight Management (SAXENDA) 18 MG/3ML SOPN    Sig: Inject 0.5 mLs (3 mg total) into the skin daily.    Dispense:  5 pen    Refill:  5   Insulin Pen Needle (NOVOFINE) 32G X 6 MM MISC    Sig: 1 Act by Does not apply route daily.    Dispense:  100 each    Refill:  1   Cholecalciferol 50 MCG (2000 UT) TABS    Sig: Take 1 tablet (2,000 Units total) by mouth daily.    Dispense:  90 tablet    Refill:  1   irbesartan (AVAPRO) 150 MG tablet    Sig: Take 1 tablet (150 mg total) by mouth daily.    Dispense:  90 tablet    Refill:  1   In addition to time spent on CPE, I spent 60 minutes in preparing to see the patient by review of recent labs, imaging and procedures, obtaining and reviewing separately obtained history, communicating with the patient and family or caregiver, ordering medications, tests or procedures, and documenting clinical information in the EHR including the  differential Dx, treatment, and any further evaluation and other management of 1. HYPERTENSION, BENIGN ESSENTIAL 2. Type II diabetes mellitus with manifestations ([place]) 3. Thiamine deficiency neuropathy 4. Hyperlipidemia with target LDL less than 100 5. Morbid obesity with BMI of 50.0-59.9, adult ([place]) 6. Gastroesophageal reflux disease without esophagitis 7. History of gastric bypass 8. Positive occult stool blood test 9. Tinnitus aurium, bilateral 10. Vitamin D deficiency disease     Follow-up: Return in about 3 months (around 09/10/2020).  Scarlette Calico, MD

## 2020-06-11 ENCOUNTER — Ambulatory Visit: Payer: Self-pay | Admitting: Podiatry

## 2020-06-11 MED ORDER — IRBESARTAN 150 MG PO TABS: 150.0000 mg | ORAL_TABLET | Freq: Every day | ORAL | 1 refills | Status: DC

## 2020-06-12 ENCOUNTER — Other Ambulatory Visit: Payer: Self-pay | Admitting: Internal Medicine

## 2020-06-12 DIAGNOSIS — E118 Type 2 diabetes mellitus with unspecified complications: Secondary | ICD-10-CM

## 2020-06-12 MED ORDER — BD PEN NEEDLE MICRO U/F 32G X 6 MM MISC: 3 refills | Status: DC

## 2020-06-12 NOTE — Progress Notes (Signed)
[facility] sent rx refill for pen needles to replace the Novofine insulin needles.   BD Ultra Micro 65m x 32G sent as requested.

## 2020-06-14 ENCOUNTER — Other Ambulatory Visit: Payer: Self-pay | Admitting: Internal Medicine

## 2020-06-14 DIAGNOSIS — E5111 Dry beriberi: Secondary | ICD-10-CM

## 2020-06-14 LAB — ZINC: Zinc: 65 ug/dL (ref 60–130)

## 2020-06-14 LAB — VITAMIN B1: Vitamin B1 (Thiamine): <6 nmol/L — ABNORMAL LOW (ref 8–30)

## 2020-06-14 MED ORDER — VITAMIN B-1 100 MG PO TABS: 50.0000 mg | ORAL_TABLET | Freq: Every day | ORAL | 1 refills | Status: DC

## 2020-06-19 ENCOUNTER — Encounter: Payer: Self-pay | Admitting: Internal Medicine

## 2020-06-26 ENCOUNTER — Encounter: Payer: Self-pay | Admitting: Internal Medicine

## 2020-06-30 ENCOUNTER — Other Ambulatory Visit: Payer: Self-pay

## 2020-06-30 ENCOUNTER — Encounter (INDEPENDENT_AMBULATORY_CARE_PROVIDER_SITE_OTHER): Payer: Self-pay | Admitting: Otolaryngology

## 2020-06-30 ENCOUNTER — Ambulatory Visit (INDEPENDENT_AMBULATORY_CARE_PROVIDER_SITE_OTHER): Payer: 59 | Admitting: Otolaryngology

## 2020-06-30 VITALS — Temp 97.7°F

## 2020-06-30 DIAGNOSIS — H9313 Tinnitus, bilateral: Secondary | ICD-10-CM

## 2020-06-30 NOTE — Progress Notes (Signed)
HPI: Joseph Wood. is a 49 y.o. male who presents is referred by her PCP for evaluation of tinnitus.  He apparently has had high-pitched ringing in both ears over the past 1 to 2 years.  He works in a noisy environment and gets annual hearing screening with his work.  He states that the tinnitus gets louder when he clenches his teeth.  Presents today for evaluation and hearing test.  Past Medical History:  Diagnosis Date   Arthritis    GERD (gastroesophageal reflux disease)    Hypertension    Obesity    OSA on CPAP    Type 2 diabetes mellitus ([place])    followed by pcp   Wears partial dentures    lower   Past Surgical History:  Procedure Laterality Date   COLONOSCOPY WITH PROPOFOL  11/06/2012   Procedure: COLONOSCOPY WITH PROPOFOL;  Surgeon: Jerene Bears, MD;  Location: [facility];  Service: Gastroenterology;  Laterality: N/A;  Andrea/   GASTRIC ROUX-EN-Y N/A 05/02/2018   Procedure: LAPAROSCOPIC ROUX-EN-Y GASTRIC BYPASS WITH UPPER ENDOSCOPY;  Surgeon: Excell Seltzer, MD;  Location: [facility];  Service: General;  Laterality: N/A;   KNEE ARTHROSCOPY W/ ACL RECONSTRUCTION Right 11-24-2009   dr dean   Social History   Socioeconomic History   Marital status: Divorced    Spouse name: Not on file   Number of children: 1   Years of education: Not on file   Highest education level: Not on file  Occupational History   Occupation: Personal assistant: [employer]  Tobacco Use   Smoking status: Former Smoker    Types: Cigars    Quit date: 04/24/1989    Years since quitting: 31.2   Smokeless tobacco: Never Used   Tobacco comment: occaional cigars, a couple a year  Media planner   Vaping Use: Never used  Substance and Sexual Activity   Alcohol use: No   Drug use: No   Sexual activity: Yes  Other Topics Concern   Not on file  Social History Narrative   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  He has one daughter. Lives in one story home.    Highest level of education: high school   Regular exercise-no   Social Determinants of Health   Financial Resource Strain:    Difficulty of Paying Living Expenses:   Food Insecurity:    Worried About Charity fundraiser in the Last Year:    Arboriculturist in the Last Year:   Transportation Needs:    Film/video editor (Medical):    Lack of Transportation (Non-Medical):   Physical Activity:    Days of Exercise per Week:    Minutes of Exercise per Session:   Stress:    Feeling of Stress :   Social Connections:    Frequency of Communication with Friends and Family:    Frequency of Social Gatherings with Friends and Family:    Attends Religious Services:    Active Member of Clubs or Organizations:    Attends Music therapist:    Marital Status:    Family History  Problem Relation Age of Onset   Arthritis Mother    Hypertension Mother    Breast cancer Mother    Diabetes Mother    Aneurysm Father        aortic   Prostate cancer Father  Hypertension Father    Diabetes Father    Hypertension Sister    Rheum arthritis Sister    Hypertension Brother    Cancer Other    Allergies  Allergen Reactions   Benazepril Hcl Cough    Severe cough   Tramadol Other (See Comments)    Severe dizziness   Prior to Admission medications   Medication Sig Start Date End Date Taking? Authorizing Provider  blood glucose meter kit and supplies KIT Use to test blood sugar daily. DX E11.8 10/26/18  Yes Janith Lima, MD  Cholecalciferol 50 MCG (2000 UT) TABS Take 1 tablet (2,000 Units total) by mouth daily. 06/10/20  Yes Janith Lima, MD  Ferrous Sulfate (IRON PO) Take 1 tablet by mouth daily.   Yes [provider]  glucose blood (COOL BLOOD GLUCOSE TEST STRIPS) test strip Use to test blood sugar daily. DX E11.8 10/26/18  Yes Janith Lima, MD  Insulin Pen  Needle (BD PEN NEEDLE MICRO U/F) 32G X 6 MM MISC Use to inject insulin daily. DX E11.8 06/12/20  Yes Janith Lima, MD  irbesartan (AVAPRO) 150 MG tablet Take 1 tablet (150 mg total) by mouth daily. 06/11/20  Yes Janith Lima, MD  Lancets MISC Use to test blood sugar daily. DX E11.8 10/26/18  Yes Janith Lima, MD  Liraglutide -Weight Management (SAXENDA) 18 MG/3ML SOPN Inject 0.5 mLs (3 mg total) into the skin daily. 06/10/20  Yes Janith Lima, MD  Multiple Vitamins-Minerals (BARIATRIC MULTIVITAMINS/IRON PO) Take 1 tablet by mouth daily.   Yes [provider]  thiamine (VITAMIN B-1) 100 MG tablet Take 0.5 tablets (50 mg total) by mouth daily. 06/14/20  Yes Janith Lima, MD     Positive ROS: Otherwise negative  All other systems have been reviewed and were otherwise negative with the exception of those mentioned in the HPI and as above.  Physical Exam: Constitutional: Alert, well-appearing, no acute distress Ears: External ears without lesions or tenderness. Ear canals are clear bilaterally with intact, clear TMs bilaterally with normal mobility on pneumatic otoscopy. Nasal: External nose without lesions. Septum with minimal deformity and mild rhinitis.  Clear nasal passages with no signs of active infection. Oral: Lips and gums without lesions. Tongue and palate mucosa without lesions. Posterior oropharynx clear. Neck: No palpable adenopathy or masses Respiratory: Breathing comfortably  Skin: No facial/neck lesions or rash noted.  Audiogram demonstrated mild hearing loss in both ears in the upper frequencies 4000 and 6000 range.  Otherwise hearing was normal.  SRT's were 15 dB on the right and 10 dB on the left.  He had type A tympanograms bilaterally.  Procedures  Assessment: Tinnitus  Plan: Reviewed with him concerning minimal treatment options for tinnitus.  Discussed with him concerning using masking noise when the tinnitus is bothersome. Discussed with him  concerning using ear protection when around loud noise. Also gave him some samples of Lipo flavonoid to try which is beneficial in some people.   Radene Journey, MD   CC:

## 2020-07-04 ENCOUNTER — Encounter (INDEPENDENT_AMBULATORY_CARE_PROVIDER_SITE_OTHER): Payer: Self-pay

## 2020-07-10 ENCOUNTER — Telehealth: Payer: Self-pay

## 2020-07-10 ENCOUNTER — Other Ambulatory Visit: Payer: Self-pay

## 2020-07-10 ENCOUNTER — Ambulatory Visit (INDEPENDENT_AMBULATORY_CARE_PROVIDER_SITE_OTHER): Payer: 59 | Admitting: Podiatry

## 2020-07-10 ENCOUNTER — Ambulatory Visit (INDEPENDENT_AMBULATORY_CARE_PROVIDER_SITE_OTHER): Payer: 59

## 2020-07-10 DIAGNOSIS — M2140 Flat foot [pes planus] (acquired), unspecified foot: Secondary | ICD-10-CM

## 2020-07-10 DIAGNOSIS — Z981 Arthrodesis status: Secondary | ICD-10-CM | POA: Diagnosis not present

## 2020-07-10 DIAGNOSIS — M76822 Posterior tibial tendinitis, left leg: Secondary | ICD-10-CM

## 2020-07-10 DIAGNOSIS — M79672 Pain in left foot: Secondary | ICD-10-CM | POA: Diagnosis not present

## 2020-07-10 NOTE — Telephone Encounter (Signed)
1.Medication Requested:glucose blood (COOL BLOOD GLUCOSE TEST STRIPS) test strip  2. Pharmacy (Name, Street, [place]):[facility] [address]  3. On Med List: Yes   4. Last Visit with PCP: 6.29.21  5. Next visit date with PCP:9.29.21   Agent: Please be advised that RX refills may take up to 3 business days. We ask that you follow-up with your pharmacy.

## 2020-07-11 MED ORDER — COOL BLOOD GLUCOSE TEST STRIPS VI STRP: ORAL_STRIP | 3 refills | Status: DC

## 2020-07-11 NOTE — Telephone Encounter (Signed)
erx for Contour Next Strips has been sent to pharmacy per pt request.

## 2020-07-13 NOTE — Progress Notes (Signed)
  Subjective:  Patient ID: Joseph Wood., male    DOB: [birth year],  MRN: [id number]  Chief Complaint  Patient presents with  . Routine Post Op    POV DOS 02/08/2020 TRIPLE ARTHRODESIS. Pt stated, "There's improvement. Pain = 3-4/10". Wearing compression anklet.    49 y.o. male presents with the above complaint. History confirmed with patient.  He states overall he has been doing somewhat better.  He is still not gone back to work.  He has been trying to arrange his long-term disability, and has had some financial difficulty.  His HR manager is working on return his calls.  Objective:  Physical Exam: warm, good capillary refill, no trophic changes or ulcerative lesions, normal DP and PT pulses and normal sensory exam. Left Foot: Mild tenderness and edema over the CCJ, mild over the TMJ, none with subtalar joint palpation.  No range of motion of the subtalar joint, talonavicular joint, CCJ.  Radiographs: X-ray of the left foot: Hardware intact and in place.  The subtalar joint is not visible, arthrodesis appears to have healed here.  The talonavicular arthrodesis also has consolidated, the calcaneocuboid joint appears to have bony fusion of the dorsal portion of the joint with mild lucency noted inferiorly Assessment:   1. Pain in left foot   2. Postsurgical arthrodesis status   3. Pes planus, unspecified laterality      Plan:  Patient was evaluated and treated and all questions answered.  -Discussed with the patient that he has near complete union of all his arthrodesis site.  It is possible that he does have some delayed union of the calcaneocuboid joint.  If this remains symptomatic we will consider bone stimulation. -I also discussed with him that given the severity of his deformity, the amount of surgery that was necessary, as well as the time since surgery, he may never be completely pain-free.  However he does state that he feels much better than he had prior to surgery.  He  is currently walking in normal shoe gear. -He is having issues with his long-term disability as well as income with his job that he works on his feet.  I discussed with him to consider and talk to his job to see if he can at least come back to work part-time or with restrictions for rest breaks every few hours.  I think would be the best thing for him financially, and I think he is unlikely to regress his progress so far.  He will consider this and talk to his employer. -Ankle compression garments were given to him -Return to see Dr. Amalia Hailey in 4 to 6 weeks   Return in about 1 month (around 08/10/2020) for with Dr Amalia Hailey.   Lanae Crumbly, DPM 07/13/2020

## 2020-07-14 ENCOUNTER — Encounter: Payer: 59 | Admitting: Podiatry

## 2020-08-11 ENCOUNTER — Ambulatory Visit (INDEPENDENT_AMBULATORY_CARE_PROVIDER_SITE_OTHER): Payer: Self-pay | Admitting: Podiatrist

## 2020-08-11 ENCOUNTER — Encounter: Payer: Self-pay | Admitting: Podiatrist

## 2020-08-11 ENCOUNTER — Ambulatory Visit (INDEPENDENT_AMBULATORY_CARE_PROVIDER_SITE_OTHER): Payer: Self-pay

## 2020-08-11 ENCOUNTER — Other Ambulatory Visit: Payer: Self-pay

## 2020-08-11 DIAGNOSIS — Z981 Arthrodesis status: Secondary | ICD-10-CM

## 2020-08-14 NOTE — Progress Notes (Signed)
Patient presents today status post triple Arthrodesis of the left foot.  Date of surgery 02/08/2020.  He relates his pain has improved and he relates the compression sock helps a lot. He states he has a slight ache on the outside of his foot but overall relates he is doing much better.  He is not back to work yet and feels like he is close to ready to go.   Subjective: Patient presents today 6 months status post foot surgery of the left foot.   Patient denies nausea, vomiting, fevers, chills or night sweats.  Denies calf pain or tenderness to the operative side.  Objective:  Neurovascular status is intact with palpable pedal pulses DP and PT at 2+ out of 4 left foot.  Negative homans sign noted.  Neurological sensation is intact and unchanged as per prior to surgery. Excellent appearance of the postoperative foot is noted.  No swelling present Incision sites are fully healed.  A slight amount of discomfort over the cc joint with direct pressure.  No range of motion of stj, tn or cc joint.    Xrays show a well healing surgical foot.  Good alignment and position noted.  Fixation in place with bony consolidation at the subtalar joint, TN joint and calcaneal cuboid joint.    Assessment: Status post triple arthrodesis of the left foot  Plan:  Discussed with the patient going back to work with restrictions.  He would like to get the OK to go back in 1 month as at that time he feels he could complete the tasks of his job without restrictions in place.  I spoke with Dr. Amalia Hailey and he is OK with this as well.  At his next appointment they can confirm this plan and a note to return to work will be generated.  If any concerns arise prior to that visit he will contact our office.

## 2020-08-25 ENCOUNTER — Telehealth: Payer: Self-pay | Admitting: Podiatry

## 2020-08-25 NOTE — Telephone Encounter (Signed)
Provider is requesting peer to peer for this patient, please call back asap

## 2020-08-25 NOTE — Telephone Encounter (Signed)
Just spoke with the patient on the phone.  I am not sure who I am supposed to call for a peer to peer.  The patient is unaware of any other physician needing to speak to me.  He is doing well and walking in tennis shoes. - Dr. Amalia Hailey

## 2020-08-28 ENCOUNTER — Encounter: Payer: Self-pay | Admitting: Internal Medicine

## 2020-09-02 ENCOUNTER — Ambulatory Visit: Payer: 59 | Admitting: Internal Medicine

## 2020-09-08 ENCOUNTER — Other Ambulatory Visit: Payer: Self-pay

## 2020-09-08 ENCOUNTER — Telehealth: Payer: Self-pay

## 2020-09-08 ENCOUNTER — Ambulatory Visit (INDEPENDENT_AMBULATORY_CARE_PROVIDER_SITE_OTHER): Payer: Self-pay | Admitting: Podiatry

## 2020-09-08 DIAGNOSIS — Z981 Arthrodesis status: Secondary | ICD-10-CM

## 2020-09-08 DIAGNOSIS — M2140 Flat foot [pes planus] (acquired), unspecified foot: Secondary | ICD-10-CM

## 2020-09-08 NOTE — Telephone Encounter (Signed)
Pt called today and would like a handicap sticker. Pt was in the office today for a visit and forgot to ask Dr. Amalia Hailey. Please advise.

## 2020-09-09 ENCOUNTER — Encounter: Payer: Self-pay | Admitting: Podiatry

## 2020-09-09 NOTE — Telephone Encounter (Signed)
Patient okay to get a handicap sticker. He can swing by the office and pick it up.

## 2020-09-09 NOTE — Telephone Encounter (Signed)
Anyone up front can. It's on the computer

## 2020-09-09 NOTE — Telephone Encounter (Signed)
We have all new people but Caryl Pina are they trained?

## 2020-09-10 ENCOUNTER — Ambulatory Visit: Payer: 59 | Admitting: Internal Medicine

## 2020-09-10 NOTE — Progress Notes (Signed)
   Subjective:  Patient presents today status post triple arthrodesis left. DOS: 02/08/2020. He states he is doing well. Patient has been weight bearing in good supportive tennis shoes and going to physical therapy 2x/week.  He continues to have improvement of the foot.  He is concerned that he is not ready however to return to work on his feet all day long.  He originally was scheduled to return to work full activity no restrictions beginning 09/12/2020.  He is concerned that his foot is not going to be able to withstand the strains of work.  No new complaints at this time  Past Medical History:  Diagnosis Date  . Arthritis   . GERD (gastroesophageal reflux disease)   . Hypertension   . Obesity   . OSA on CPAP   . Type 2 diabetes mellitus ([place])    followed by pcp  . Wears partial dentures    lower      Objective/Physical Exam Neurovascular status intact.  Skin incisions are healed.  Negative for any significant pain on palpation to the surgical area.  Negative for range of motion to the tarsal joints.  There continues to be minimal edema noted to the surgical foot.  No sign of infectious process.   Assessment: 1. s/p triple arthrodesis left. DOS: 02/08/2020   Plan of Care:  1. Patient was evaluated. 2.  After evaluation today I do believe the patient has not ready to resume full activity no restrictions.  We will push this out for 1 additional month.  In the meantime we will allow the patient to return to work with restrictions. 3.  Restrictions include: Frequent sitting for up to 5.5 hours and 8-hour day.  Occasional intermittent standing/walking for 10-15 minutes at a time for up to 2.5 hours in an 8-hour day; occasional lift/no carry up to 20 pounds; no pushing/pulling; no kneeling, crawling, crouching, bending, balancing, stooping, squatting or climbing ladders; occasional climbing stairs; no restrictions driving and automatic vehicle, or handling, grasping, fingering, or reaching  overhead or at/below waist level while seated. 4.  Restrictions extended to 10/13/2020.  Potentially at that time we will advance the patient to full activity no restrictions 5.  Return to clinic in 4 weeks  Edrick Kins, DPM [facility]  Dr. Edrick Kins, [address]                                        [place],  [address]                Office [phone]  Fax [phone]

## 2020-09-17 ENCOUNTER — Telehealth: Payer: Self-pay | Admitting: Podiatry

## 2020-09-17 NOTE — Telephone Encounter (Signed)
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 Amalia Hailey approves. Call back number is [phone].

## 2020-09-18 NOTE — Telephone Encounter (Signed)
Levada Dy, can you help this gentleman with his handicap placards, please? Dr. Amalia Hailey has approved it. Thanks!

## 2020-09-18 NOTE — Telephone Encounter (Signed)
I'm okay with it. - Dr. Amalia Hailey

## 2020-10-08 ENCOUNTER — Ambulatory Visit: Payer: Self-pay | Admitting: Podiatry

## 2020-10-13 ENCOUNTER — Ambulatory Visit (INDEPENDENT_AMBULATORY_CARE_PROVIDER_SITE_OTHER): Payer: Self-pay | Admitting: Podiatry

## 2020-10-13 ENCOUNTER — Other Ambulatory Visit: Payer: Self-pay

## 2020-10-13 DIAGNOSIS — Z981 Arthrodesis status: Secondary | ICD-10-CM

## 2020-10-13 DIAGNOSIS — M2140 Flat foot [pes planus] (acquired), unspecified foot: Secondary | ICD-10-CM

## 2020-10-13 DIAGNOSIS — M79672 Pain in left foot: Secondary | ICD-10-CM

## 2020-10-13 NOTE — Progress Notes (Signed)
   Subjective:  Patient presents today status post triple arthrodesis left. DOS: 02/08/2020. He states he is doing well.  Patient states that he only has intermittent mild pain especially when going barefoot.  No new complaints at this time  Past Medical History:  Diagnosis Date  . Arthritis   . GERD (gastroesophageal reflux disease)   . Hypertension   . Obesity   . OSA on CPAP   . Type 2 diabetes mellitus ([place])    followed by pcp  . Wears partial dentures    lower      Objective/Physical Exam Neurovascular status intact.  Skin incisions are healed.  Negative for any significant pain on palpation to the surgical area.  Negative for range of motion to the tarsal joints.  There continues to be minimal edema noted to the surgical foot.  No sign of infectious process.   Assessment: 1. s/p triple arthrodesis left. DOS: 02/08/2020   Plan of Care:  1. Patient was evaluated. 2.  At this point the patient is doing very well.  He may now resume full activity no restrictions with the exception of wearing good supportive shoes constantly when weightbearing. 3.  No restrictions or limitations with job function 4.  Return to clinic as needed  *Has a new job working part-time at [employer].  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 Edrick Kins, DPM    [address], [place] [address]                Office [phone]  Fax [phone]

## 2020-11-21 ENCOUNTER — Encounter (HOSPITAL_COMMUNITY): Payer: Self-pay

## 2021-01-27 ENCOUNTER — Telehealth: Payer: Self-pay | Admitting: Internal Medicine

## 2021-01-27 DIAGNOSIS — E118 Type 2 diabetes mellitus with unspecified complications: Secondary | ICD-10-CM

## 2021-01-27 DIAGNOSIS — I10 Essential (primary) hypertension: Secondary | ICD-10-CM

## 2021-01-27 NOTE — Telephone Encounter (Signed)
irbesartan (AVAPRO) 150 MG tablet Patient went to go pick up his refill and it was 80 dollars because he no longer has insurance, patient wondering if there is something we can do to help him

## 2021-01-28 NOTE — Telephone Encounter (Signed)
I have informed the pt that we have GoodRx card that could help him with the cost. I have left a couple up front for him to pick up.

## 2021-02-05 MED ORDER — IRBESARTAN 150 MG PO TABS: 150.0000 mg | ORAL_TABLET | Freq: Every day | ORAL | 1 refills | Status: DC

## 2021-02-05 NOTE — Telephone Encounter (Signed)
Patient requesting order for irbesartan (AVAPRO) 150 MG tablet be sent to [facility], [place]

## 2021-02-05 NOTE — Addendum Note (Signed)
Addended by: Hinda Kehr on: 02/05/2021 04:38 PM   Modules accepted: Orders

## 2021-02-12 NOTE — Telephone Encounter (Signed)
Patient called and said that irbesartan (AVAPRO) 150 MG tablet is still too expensive and was wondering if there was anything else that could be called in. Please advise

## 2021-02-18 NOTE — Telephone Encounter (Signed)
He needs to be seen

## 2021-02-18 NOTE — Telephone Encounter (Signed)
Pt has been scheduled on 3/30 @ 3.40pm

## 2021-03-11 ENCOUNTER — Ambulatory Visit (INDEPENDENT_AMBULATORY_CARE_PROVIDER_SITE_OTHER): Payer: Self-pay | Admitting: Internal Medicine

## 2021-03-11 ENCOUNTER — Encounter: Payer: Self-pay | Admitting: Internal Medicine

## 2021-03-11 ENCOUNTER — Ambulatory Visit: Payer: Self-pay | Admitting: Internal Medicine

## 2021-03-11 ENCOUNTER — Other Ambulatory Visit: Payer: Self-pay

## 2021-03-11 VITALS — BP 142/82 | HR 68 | Temp 98.3°F | Resp 16 | Ht 69.0 in | Wt 349.0 lb

## 2021-03-11 DIAGNOSIS — I10 Essential (primary) hypertension: Secondary | ICD-10-CM

## 2021-03-11 DIAGNOSIS — E118 Type 2 diabetes mellitus with unspecified complications: Secondary | ICD-10-CM

## 2021-03-11 DIAGNOSIS — E785 Hyperlipidemia, unspecified: Secondary | ICD-10-CM

## 2021-03-11 DIAGNOSIS — Z23 Encounter for immunization: Secondary | ICD-10-CM

## 2021-03-11 DIAGNOSIS — E5111 Dry beriberi: Secondary | ICD-10-CM

## 2021-03-11 LAB — POCT GLYCOSYLATED HEMOGLOBIN (HGB A1C): Hemoglobin A1C: 5.5 % (ref 4.0–5.6)

## 2021-03-11 MED ORDER — VITAMIN B-1 100 MG PO TABS: 50.0000 mg | ORAL_TABLET | Freq: Every day | ORAL | 1 refills | Status: DC

## 2021-03-11 MED ORDER — INDAPAMIDE 1.25 MG PO TABS: 1.2500 mg | ORAL_TABLET | Freq: Every day | ORAL | 1 refills | Status: DC

## 2021-03-11 NOTE — Patient Instructions (Signed)

## 2021-03-11 NOTE — Progress Notes (Signed)
Subjective:  Patient ID: Joseph Files., male    DOB: [birth year]  Age: 50 y.o. MRN: [id number]  CC: Hypertension and Diabetes  This visit occurred during the SARS-CoV-2 public health emergency.  Safety protocols were in place, including screening questions prior to the visit, additional usage of staff PPE, and extensive cleaning of exam room while observing appropriate contact time as indicated for disinfecting solutions.    HPI Joseph J [employer]. presents for f/up -   He does not monitor his blood pressure.  He complains of weight gain but is very active and denies CP, DOE, palpitations, edema, or fatigue.  Outpatient Medications Prior to Visit  Medication Sig Dispense Refill  . blood glucose meter kit and supplies KIT Use to test blood sugar daily. DX E11.8 1 each 0  . Cholecalciferol 50 MCG (2000 UT) TABS Take 1 tablet (2,000 Units total) by mouth daily. 90 tablet 1  . Ferrous Sulfate (IRON PO) Take 1 tablet by mouth daily.    Marland Kitchen glucose blood (COOL BLOOD GLUCOSE TEST STRIPS) test strip Use to test blood sugar daily. DX E11.8 100 each 3  . Insulin Pen Needle (BD PEN NEEDLE MICRO U/F) 32G X 6 MM MISC Use to inject insulin daily. DX E11.8 100 each 3  . Lancets MISC Use to test blood sugar daily. DX E11.8 100 each 3  . Multiple Vitamins-Minerals (BARIATRIC MULTIVITAMINS/IRON PO) Take 1 tablet by mouth daily.    . irbesartan (AVAPRO) 150 MG tablet Take 1 tablet (150 mg total) by mouth daily. 90 tablet 1  . Liraglutide -Weight Management (SAXENDA) 18 MG/3ML SOPN Inject 0.5 mLs (3 mg total) into the skin daily. 5 pen 5  . thiamine (VITAMIN B-1) 100 MG tablet Take 0.5 tablets (50 mg total) by mouth daily. 45 tablet 1   No facility-administered medications prior to visit.    ROS Review of Systems  Constitutional: Negative for diaphoresis, fatigue and unexpected weight change.  HENT: Negative.   Eyes: Negative for visual disturbance.  Respiratory: Negative for cough, chest  tightness, shortness of breath and wheezing.   Cardiovascular: Negative for chest pain, palpitations and leg swelling.  Gastrointestinal: Negative for abdominal pain, constipation, diarrhea and nausea.  Endocrine: Negative.   Genitourinary: Negative.   Musculoskeletal: Positive for arthralgias. Negative for myalgias.  Skin: Negative.   Neurological: Negative.  Negative for dizziness, weakness, light-headedness and headaches.  Hematological: Negative for adenopathy. Does not bruise/bleed easily.  Psychiatric/Behavioral: Negative.     Objective:  BP (!) 142/82 (BP Location: Left Arm, Patient Position: Sitting, Cuff Size: Large)   Pulse 68   Temp 98.3 F (36.8 C) (Oral)   Resp 16   Ht _0  (1.753 m)   Wt (!) 349 lb (158.3 kg)   SpO2 96%   BMI 51.54 kg/m   BP Readings from Last 3 Encounters:  03/11/21 (!) 142/82  06/10/20 140/90  09/07/18 118/76    Wt Readings from Last 3 Encounters:  03/11/21 (!) 349 lb (158.3 kg)  06/10/20 (!) 347 lb (157.4 kg)  06/13/19 (!) 305 lb 3.2 oz (138.4 kg)    Physical Exam Vitals reviewed.  Constitutional:      General: He is not in acute distress.    Appearance: He is obese. He is not ill-appearing or toxic-appearing.  HENT:     Nose: Nose normal.     Mouth/Throat:     Mouth: Mucous membranes are moist.  Eyes:     General: No scleral icterus.  Pupils: Pupils are equal, round, and reactive to light.  Cardiovascular:     Rate and Rhythm: Normal rate and regular rhythm.     Heart sounds: No murmur heard.   Pulmonary:     Effort: Pulmonary effort is normal.     Breath sounds: No stridor. No wheezing, rhonchi or rales.  Abdominal:     General: Abdomen is protuberant. Bowel sounds are normal. There is no distension.     Palpations: Abdomen is soft. There is no hepatomegaly.     Tenderness: There is no abdominal tenderness.  Musculoskeletal:        General: Normal range of motion.     Cervical back: Neck supple.     Right lower leg:  No edema.     Left lower leg: No edema.  Lymphadenopathy:     Cervical: No cervical adenopathy.  Skin:    General: Skin is warm and dry.     Coloration: Skin is not pale.  Neurological:     General: No focal deficit present.     Mental Status: He is alert.  Psychiatric:        Mood and Affect: Mood normal.        Behavior: Behavior normal.     Lab Results  Component Value Date   WBC 5.4 06/10/2020   HGB 14.7 06/10/2020   HCT 43.6 06/10/2020   PLT 215.0 06/10/2020   GLUCOSE 99 06/10/2020   CHOL 166 06/10/2020   TRIG 83.0 06/10/2020   HDL 51.40 06/10/2020   LDLCALC 98 06/10/2020   ALT 15 06/10/2020   AST 17 06/10/2020   NA 144 06/10/2020   K 4.3 06/10/2020   CL 107 06/10/2020   CREATININE 0.94 06/10/2020   BUN 15 06/10/2020   CO2 31 06/10/2020   TSH 2.38 06/10/2020   PSA 0.74 06/10/2020   HGBA1C 5.5 03/11/2021   MICROALBUR <0.7 06/10/2020    CT ANKLE LEFT WO CONTRAST  Result Date: 10/20/2019 CLINICAL DATA:  Left ankle pain and swelling. Unable to flex the toes. EXAM: CT OF THE LEFT ANKLE WITHOUT CONTRAST TECHNIQUE: Multidetector CT imaging of the left ankle was performed according to the standard protocol. Multiplanar CT image reconstructions were also generated. COMPARISON:  None. FINDINGS: Bones/Joint/Cartilage No fracture or dislocation. Normal alignment. Small joint effusion. Mild osteoarthritis of the posterior subtalar joint. Moderate osteoarthritis of the talonavicular joint. Mild osteoarthritis of the calcaneocuboid joint. Mild osteoarthritis of the fourth tarsometatarsal joint. Mild osteoarthritis of the third tarsometatarsal joint. Mild osteoarthritis of the tibiotalar joint with small marginal osteophytes. Relative pes planus.  Small plantar calcaneal spur. Ligaments Ligaments are suboptimally evaluated by CT. Muscles and Tendons Muscles are normal. No muscle atrophy. Flexor compartment tendons are not well visualized. Achilles tendon is grossly intact. Peroneal  tendons are grossly intact. Soft tissue No fluid collection or hematoma.  No soft tissue mass. IMPRESSION: 1.  No acute osseous injury of the left ankle. 2. Mild osteoarthritis of the posterior subtalar joint. Moderate osteoarthritis of the talonavicular joint. Mild osteoarthritis of the calcaneocuboid joint. Mild osteoarthritis of the fourth tarsometatarsal joint. Mild osteoarthritis of the third tarsometatarsal joint. Mild osteoarthritis of the tibiotalar joint with small marginal osteophytes. 3. Relative pes planus. Electronically Signed   By: Kathreen Devoid   On: 10/20/2019 13:18    Assessment & Plan:   Sion was seen today for hypertension and diabetes.  Diagnoses and all orders for this visit:  HYPERTENSION, BENIGN ESSENTIAL- His blood pressure is not adequately well  controlled.  I recommended that he start taking indapamide. -     indapamide (LOZOL) 1.25 MG tablet; Take 1 tablet (1.25 mg total) by mouth daily.  Type II diabetes mellitus with manifestations ([place])- His blood sugar is adequately well controlled. -     POCT glycosylated hemoglobin (Hb A1C)  Thiamine deficiency neuropathy -     thiamine (VITAMIN B-1) 100 MG tablet; Take 0.5 tablets (50 mg total) by mouth daily.  Need for pneumococcal vaccination -     Pneumococcal conjugate vaccine 20-valent (Prevnar 20)  Hyperlipidemia with target LDL less than 100- His ASCVD risk score is only 6.2%.  I therefore did not recommend that he take a statin for CV risk reduction.   I have discontinued Joseph J. Campise Jr.'s Saxenda and irbesartan. I am also having him start on indapamide. Additionally, I am having him maintain his Multiple Vitamins-Minerals (BARIATRIC MULTIVITAMINS/IRON PO), blood glucose meter kit and supplies, Lancets, Ferrous Sulfate (IRON PO), Cholecalciferol, BD Pen Needle Micro U/F, Cool Blood Glucose Test Strips, and thiamine.  Meds ordered this encounter  Medications  . thiamine (VITAMIN B-1) 100 MG tablet     Sig: Take 0.5 tablets (50 mg total) by mouth daily.    Dispense:  45 tablet    Refill:  1  . indapamide (LOZOL) 1.25 MG tablet    Sig: Take 1 tablet (1.25 mg total) by mouth daily.    Dispense:  90 tablet    Refill:  1     Follow-up: Return in about 6 months (around 09/11/2021).  Scarlette Calico, MD

## 2021-03-16 ENCOUNTER — Telehealth: Payer: Self-pay | Admitting: Podiatry

## 2021-03-16 NOTE — Telephone Encounter (Signed)
Joseph Wood called to make an appt., but he does not have any insurance. He is under the impression that you will see me without insurance. He said you told him that it would be covered. He wanted me to run it by you, before scheduling. He is having pain on the top of his foot, that he had surgery on about a year ago.

## 2021-03-18 ENCOUNTER — Telehealth: Payer: Self-pay | Admitting: *Deleted

## 2021-03-18 NOTE — Telephone Encounter (Signed)
Patient is requesting a handicap placard. Please advise

## 2021-03-20 ENCOUNTER — Telehealth: Payer: Self-pay | Admitting: Podiatry

## 2021-03-20 NOTE — Telephone Encounter (Signed)
Pt is requesting a handicap sticker for another 6 months. Please advise.

## 2021-03-20 NOTE — Telephone Encounter (Signed)
That's fine

## 2021-03-20 NOTE — Telephone Encounter (Signed)
No, he needs insurance, but I just filled out a handicap parking placard. - Dr. Amalia Hailey

## 2021-03-20 NOTE — Telephone Encounter (Signed)
Yes, okay to continue handicap parking placard. - Dr. Amalia Hailey

## 2021-03-23 NOTE — Telephone Encounter (Signed)
Called and informed patient that his handicap placard has been approved,will pick up paperwork at front desk.

## 2021-04-05 ENCOUNTER — Emergency Department (HOSPITAL_COMMUNITY): Payer: Self-pay

## 2021-04-05 ENCOUNTER — Emergency Department (HOSPITAL_COMMUNITY)
Admission: EM | Admit: 2021-04-05 | Discharge: 2021-04-05 | Disposition: A | Discharge status: Home / Self Care | Payer: Self-pay | Attending: Emergency Medicine | Admitting: Emergency Medicine

## 2021-04-05 ENCOUNTER — Encounter (HOSPITAL_COMMUNITY): Payer: Self-pay | Admitting: Emergency Medicine

## 2021-04-05 DIAGNOSIS — J4530 Mild persistent asthma, uncomplicated: Secondary | ICD-10-CM | POA: Insufficient documentation

## 2021-04-05 DIAGNOSIS — I10 Essential (primary) hypertension: Secondary | ICD-10-CM | POA: Insufficient documentation

## 2021-04-05 DIAGNOSIS — Z794 Long term (current) use of insulin: Secondary | ICD-10-CM | POA: Insufficient documentation

## 2021-04-05 DIAGNOSIS — K6289 Other specified diseases of anus and rectum: Secondary | ICD-10-CM | POA: Insufficient documentation

## 2021-04-05 DIAGNOSIS — Z87891 Personal history of nicotine dependence: Secondary | ICD-10-CM | POA: Insufficient documentation

## 2021-04-05 DIAGNOSIS — E119 Type 2 diabetes mellitus without complications: Secondary | ICD-10-CM | POA: Insufficient documentation

## 2021-04-05 LAB — CBC WITH DIFFERENTIAL/PLATELET
Abs Immature Granulocytes: 0.03 10*3/uL (ref 0.00–0.07)
Basophils Absolute: 0 10*3/uL (ref 0.0–0.1)
Basophils Relative: 1 %
Eosinophils Absolute: 0.2 10*3/uL (ref 0.0–0.5)
Eosinophils Relative: 2 %
HCT: 42.7 % (ref 39.0–52.0)
Hemoglobin: 13.9 g/dL (ref 13.0–17.0)
Immature Granulocytes: 0 %
Lymphocytes Relative: 27 %
Lymphs Abs: 2.2 10*3/uL (ref 0.7–4.0)
MCH: 29.8 pg (ref 26.0–34.0)
MCHC: 32.6 g/dL (ref 30.0–36.0)
MCV: 91.4 fL (ref 80.0–100.0)
Monocytes Absolute: 0.6 10*3/uL (ref 0.1–1.0)
Monocytes Relative: 7 %
Neutro Abs: 5.2 10*3/uL (ref 1.7–7.7)
Neutrophils Relative %: 63 %
Platelets: 236 10*3/uL (ref 150–400)
RBC: 4.67 MIL/uL (ref 4.22–5.81)
RDW: 13.5 % (ref 11.5–15.5)
WBC: 8.2 10*3/uL (ref 4.0–10.5)
nRBC: 0 % (ref 0.0–0.2)

## 2021-04-05 LAB — BASIC METABOLIC PANEL
Anion gap: 7 (ref 5–15)
BUN: 15 mg/dL (ref 6–20)
CO2: 29 mmol/L (ref 22–32)
Calcium: 8.9 mg/dL (ref 8.9–10.3)
Chloride: 105 mmol/L (ref 98–111)
Creatinine, Ser: 0.99 mg/dL (ref 0.61–1.24)
GFR, Estimated: >60 mL/min (ref >60)
Glucose, Bld: 91 mg/dL (ref 70–99)
Potassium: 4.2 mmol/L (ref 3.5–5.1)
Sodium: 141 mmol/L (ref 135–145)

## 2021-04-05 MED ORDER — SULFAMETHOXAZOLE-TRIMETHOPRIM 800-160 MG PO TABS
1.0000 | ORAL_TABLET | Freq: Once | ORAL | Status: AC
  Administered 2021-04-05: 1 via ORAL
  Filled 2021-04-05: qty 1

## 2021-04-05 MED ORDER — SULFAMETHOXAZOLE-TRIMETHOPRIM 800-160 MG PO TABS: 1.0000 | ORAL_TABLET | Freq: Two times a day (BID) | ORAL | 0 refills | Status: AC

## 2021-04-05 MED ORDER — HYDROCORTISONE ACETATE 25 MG RE SUPP: 25.0000 mg | Freq: Two times a day (BID) | RECTAL | 0 refills | Status: DC

## 2021-04-05 MED ORDER — IOHEXOL 300 MG/ML  SOLN
100.0000 mL | Freq: Once | INTRAMUSCULAR | Status: AC | PRN
  Administered 2021-04-05: 100 mL via INTRAVENOUS

## 2021-04-05 NOTE — ED Triage Notes (Signed)
C/o rectal abscess x 1 week that is gradually getting worse.

## 2021-04-05 NOTE — ED Provider Notes (Signed)
[facility] EMERGENCY DEPARTMENT Provider Note   CSN: [id number] Arrival date & time: 04/05/21  1606     History No chief complaint on file.   Joseph J Quantae Martel. is a 50 y.o. male.  Patient with history of type 2 diabetes not currently on medication, hypertension --presents the emergency department for evaluation of rectal pain.  Patient began having pain about a week ago.  Since that time, pain has become progressively worse.  He notes pain with walking and with pressure on the area.  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  He states it feels like there is a little "knot" in the area of pain when he wipes.  He denies blood in the stool or bowel changes.  He has never had an abscess or any other condition which caused pain like this in the past.  No urinary symptoms.  No history of immunocompromise.  No treatments prior to arrival.  Patient states that the pain has caused him to avoid doing certain activities such as mow his yard on a riding mower.         Past Medical History:  Diagnosis Date  . Arthritis   . GERD (gastroesophageal reflux disease)   . Hypertension   . Obesity   . OSA on CPAP   . Type 2 diabetes mellitus ([place])    followed by pcp  . Wears partial dentures    lower    Patient Active Problem List   Diagnosis Date Noted  . History of gastric bypass 06/10/2020  . Positive occult stool blood test 06/10/2020  . Tinnitus aurium, bilateral 06/10/2020  . Vitamin D deficiency disease 06/10/2020  . Thiamine deficiency neuropathy 09/17/2018  . Morbid obesity with BMI of 50.0-59.9, adult ([place]) 05/02/2018  . SUI (stress urinary incontinence), male 08/01/2017  . Tinea corporis 08/01/2017  . Neuropathy involving both lower extremities 12/01/2016  . OA (osteoarthritis) 09/28/2016  . Asthma, mild persistent 04/06/2016  . Spinal stenosis, lumbar region without neurogenic claudication  02/06/2016  . Plantar fasciitis, bilateral 09/22/2015  . Gastroesophageal reflux disease without esophagitis 09/22/2015  . Renal stone 08/15/2015  . Hyperlipidemia with target LDL less than 100 02/12/2015  . Benign colon polyp 11/06/2012  . Routine general medical examination at a health care facility 09/05/2012  . Type II diabetes mellitus with manifestations ([place]) 06/15/2011  . HYPERTENSION, BENIGN ESSENTIAL 01/12/2011  . Obstructive sleep apnea 01/12/2011    Past Surgical History:  Procedure Laterality Date  . COLONOSCOPY WITH PROPOFOL  11/06/2012   Procedure: COLONOSCOPY WITH PROPOFOL;  Surgeon: Jerene Bears, MD;  Location: [facility]COPY;  Service: Gastroenterology;  Laterality: N/A;  Andrea/  . GASTRIC ROUX-EN-Y N/A 05/02/2018   Procedure: LAPAROSCOPIC ROUX-EN-Y GASTRIC BYPASS WITH UPPER ENDOSCOPY;  Surgeon: Excell Seltzer, MD;  Location: [facility] ORS;  Service: General;  Laterality: N/A;  . KNEE ARTHROSCOPY W/ ACL RECONSTRUCTION Right 11-24-2009   dr dean       Family History  Problem Relation Age of Onset  . Arthritis Mother   . Hypertension Mother   . Breast cancer Mother   . Diabetes Mother   . Aneurysm Father        aortic  . Prostate cancer Father   . Hypertension Father   . Diabetes Father   . Hypertension Sister   . Rheum arthritis Sister   . Hypertension Brother   . Cancer Other     Social History  Tobacco Use  . Smoking status: Former Smoker    Types: Cigars    Quit date: 04/24/1989    Years since quitting: 31.9  . Smokeless tobacco: Never Used  . Tobacco comment: occaional cigars, a couple a year  Vaping Use  . Vaping Use: Never used  Substance Use Topics  . Alcohol use: No  . Drug use: No    Home Medications Prior to Admission medications   Medication Sig Start Date End Date Taking? Authorizing Provider  blood glucose meter kit and supplies KIT Use to test blood sugar daily. DX E11.8 10/26/18   Janith Lima, MD  Cholecalciferol 50 MCG (2000  UT) TABS Take 1 tablet (2,000 Units total) by mouth daily. 06/10/20   Janith Lima, MD  Ferrous Sulfate (IRON PO) Take 1 tablet by mouth daily.    [provider]  glucose blood (COOL BLOOD GLUCOSE TEST STRIPS) test strip Use to test blood sugar daily. DX E11.8 07/11/20   Janith Lima, MD  indapamide (LOZOL) 1.25 MG tablet Take 1 tablet (1.25 mg total) by mouth daily. 03/11/21   Janith Lima, MD  Insulin Pen Needle (BD PEN NEEDLE MICRO U/F) 32G X 6 MM MISC Use to inject insulin daily. DX E11.8 06/12/20   Janith Lima, MD  Lancets MISC Use to test blood sugar daily. DX E11.8 10/26/18   Janith Lima, MD  Multiple Vitamins-Minerals (BARIATRIC MULTIVITAMINS/IRON PO) Take 1 tablet by mouth daily.    [provider]  thiamine (VITAMIN B-1) 100 MG tablet Take 0.5 tablets (50 mg total) by mouth daily. 03/11/21   Janith Lima, MD    Allergies    Benazepril hcl and Tramadol  Review of Systems   Review of Systems  Constitutional: Negative for chills and fever.  HENT: Negative for rhinorrhea and sore throat.   Eyes: Negative for redness.  Respiratory: Negative for cough and shortness of breath.   Cardiovascular: Negative for chest pain.  Gastrointestinal: Positive for rectal pain. Negative for abdominal pain, blood in stool, diarrhea, nausea and vomiting.  Genitourinary: Negative for dysuria and hematuria.  Musculoskeletal: Negative for myalgias.  Skin: Negative for rash.  Neurological: Negative for headaches.    Physical Exam Updated Vital Signs BP (!) 127/98 (BP Location: Left Arm)   Pulse 75   Temp 98.5 F (36.9 C) (Oral)   Resp 18   SpO2 99%   Physical Exam Vitals and nursing note reviewed. Exam conducted with a chaperone present.  Constitutional:      Appearance: He is well-developed.  HENT:     Head: Normocephalic and atraumatic.  Eyes:     General:        Right eye: No discharge.        Left eye: No discharge.     Conjunctiva/sclera: Conjunctivae  normal.  Cardiovascular:     Rate and Rhythm: Normal rate and regular rhythm.     Heart sounds: Normal heart sounds.  Pulmonary:     Effort: Pulmonary effort is normal.     Breath sounds: Normal breath sounds.  Abdominal:     Palpations: Abdomen is soft.     Tenderness: There is no abdominal tenderness.  Genitourinary:    Rectum: Tenderness present. No mass, anal fissure or external hemorrhoid.    Musculoskeletal:     Cervical back: Normal range of motion and neck supple.  Skin:    General: Skin is warm and dry.  Neurological:     Mental  Status: He is alert.     ED Results / Procedures / Treatments   Labs (all labs ordered are listed, but only abnormal results are displayed) Labs Reviewed  CBC WITH DIFFERENTIAL/PLATELET  BASIC METABOLIC PANEL    EKG None  Radiology CT ABDOMEN PELVIS W CONTRAST  Result Date: 04/05/2021 CLINICAL DATA:  Rectal abscess for 1 week getting worse. Rectal pain. EXAM: CT ABDOMEN AND PELVIS WITH CONTRAST TECHNIQUE: Multidetector CT imaging of the abdomen and pelvis was performed using the standard protocol following bolus administration of intravenous contrast. CONTRAST:  141m OMNIPAQUE IOHEXOL 300 MG/ML  SOLN COMPARISON:  07/29/2015 FINDINGS: Lower chest: Moderate-sized esophageal hiatal hernia. Lung bases are clear. Hepatobiliary: No focal liver abnormality is seen. No gallstones, gallbladder wall thickening, or biliary dilatation. Pancreas: Unremarkable. No pancreatic ductal dilatation or surrounding inflammatory changes. Spleen: Normal in size without focal abnormality. Adrenals/Urinary Tract: Adrenal glands are unremarkable. Kidneys are normal, without renal calculi, focal lesion, or hydronephrosis. Bladder is unremarkable. Stomach/Bowel: Postoperative changes consistent with gastric bypass. Stomach, small bowel, and colon are not abnormally distended. No wall thickening or inflammatory changes are identified. Appendix is not identified. Minimal soft  tissue thickening and a tiny gas bubble demonstrated posterior to the anus may correspond to the history of perirectal abscess. No discrete fluid collection is identified. Vascular/Lymphatic: No significant vascular findings are present. No enlarged abdominal or pelvic lymph nodes. Reproductive: Prostate is unremarkable. Other: No free air or free fluid in the abdomen. Abdominal wall musculature appears intact. Musculoskeletal: Degenerative changes in the spine. No destructive bone lesions. IMPRESSION: 1. Minimal soft tissue thickening and a tiny gas bubble posterior to the anus may correspond to the history of perirectal abscess. No discrete fluid collection is identified. 2. Moderate-sized esophageal hiatal hernia. 3. Postoperative gastric bypass. Electronically Signed   By: WLucienne CapersM.D.   On: 04/05/2021 19:21    Procedures Procedures   Medications Ordered in ED Medications - No data to display  ED Course  I have reviewed the triage vital signs and the nursing notes.  Pertinent labs & imaging results that were available during my care of the patient were reviewed by me and considered in my medical decision making (see chart for details).  Patient seen and examined.  Patient has of progressive pain, worsened with digital rectal exam.  Concern for abscess that I am unable to visualize.  No obvious fissuring or hemorrhoid noted.  Questionable area of cellulitis externally.  We will proceed with CT imaging to evaluate the area.  Vital signs reviewed and are as follows: BP (!) 127/98 (BP Location: Left Arm)   Pulse 75   Temp 98.5 F (36.9 C) (Oral)   Resp 18   SpO2 99%   7:50 PM labs are normal.  CT shows hiatal hernia, post gastric bypass changes, and a small gas lobule in the area of the rectum which may be a small resolving abscess.  No fluid collections.  No indication for surgical evaluation tonight.  Will cover for abscess with Bactrim, give course of Anusol suppositories,  encourage sitz bath's.  The patient was urged to return to the Emergency Department urgently with worsening pain, swelling, expanding erythema especially if it streaks away from the affected area, fever, or if they have any other concerns.   The patient verbalized understanding and stated agreement with this plan.     MDM Rules/Calculators/A&P  Patient with rectal pain, normal external exam.  CT does not show any large fluid collections or inflammation.  Possible small gas bubble.  Patient be treated with course of Bactrim and steroids as above.  Return instructions discussed as above.    Final Clinical Impression(s) / ED Diagnoses Final diagnoses:  Rectal pain    Rx / DC Orders ED Discharge Orders         Ordered    sulfamethoxazole-trimethoprim (BACTRIM DS) 800-160 MG tablet  2 times daily        04/05/21 1948    hydrocortisone (ANUSOL-HC) 25 MG suppository  2 times daily        04/05/21 1948           Carlisle Cater, Hershal Coria 04/05/21 1952    Luna Fuse, MD 04/05/21 2005

## 2021-04-05 NOTE — Discharge Instructions (Signed)
Please read and follow all provided instructions.  Your diagnoses today include:  1. Rectal pain     Tests performed today include:  Vital signs. See below for your results today.  Blood cell counts (white, red, and platelets) Electrolytes  Kidney function test CT scan -shows, small gas bubble which may be related to recent abscess, now large abscesses or collections of pus noted.  No severe inflammation noted.  Medications prescribed:   Bactrim (trimethoprim/sulfamethoxazole) - antibiotic  You have been prescribed an antibiotic medicine: take the entire course of medicine even if you are feeling better. Stopping early can cause the antibiotic not to work.  Take any prescribed medications only as directed.   Home care instructions:   Follow any educational materials contained in this packet  Follow-up instructions: See your doctor for recheck in the next 72 hours.  Return instructions:  Return to the Emergency Department if you have:  Fever  Worsening symptoms  Worsening pain  Worsening swelling  Redness of the skin that moves away from the affected area, especially if it streaks away from the affected area   Any other emergent concerns  Your vital signs today were: BP 122/67 (BP Location: Left Arm)   Pulse 62   Temp 98.1 F (36.7 C) (Oral)   Resp 15   SpO2 98%  If your blood pressure (BP) was elevated above 135/85 this visit, please have this repeated by your doctor within one month. --------------

## 2021-04-07 ENCOUNTER — Ambulatory Visit (INDEPENDENT_AMBULATORY_CARE_PROVIDER_SITE_OTHER): Payer: Self-pay | Admitting: Internal Medicine

## 2021-04-07 ENCOUNTER — Telehealth: Payer: Self-pay | Admitting: Internal Medicine

## 2021-04-07 ENCOUNTER — Other Ambulatory Visit: Payer: Self-pay

## 2021-04-07 VITALS — BP 120/72 | HR 77 | Temp 98.2°F | Ht 69.0 in | Wt 343.0 lb

## 2021-04-07 DIAGNOSIS — I10 Essential (primary) hypertension: Secondary | ICD-10-CM

## 2021-04-07 DIAGNOSIS — E118 Type 2 diabetes mellitus with unspecified complications: Secondary | ICD-10-CM

## 2021-04-07 DIAGNOSIS — K612 Anorectal abscess: Secondary | ICD-10-CM | POA: Insufficient documentation

## 2021-04-07 MED ORDER — CIPROFLOXACIN HCL 500 MG PO TABS: 500.0000 mg | ORAL_TABLET | Freq: Two times a day (BID) | ORAL | 0 refills | Status: AC

## 2021-04-07 MED ORDER — HYDROCODONE-ACETAMINOPHEN 7.5-325 MG PO TABS: 1.0000 | ORAL_TABLET | Freq: Four times a day (QID) | ORAL | 0 refills | Status: DC | PRN

## 2021-04-07 MED ORDER — METRONIDAZOLE 250 MG PO TABS: 250.0000 mg | ORAL_TABLET | Freq: Three times a day (TID) | ORAL | 0 refills | Status: AC

## 2021-04-07 NOTE — Telephone Encounter (Signed)
[facility] called needing a diagnosis code for the prescription:  HYDROcodone-acetaminophen ([place]) 7.5-325 MG tablet  Please call them at [phone]

## 2021-04-07 NOTE — Patient Instructions (Signed)
Ok to stop the bactrim  Please take all new medication as prescribed - the cipro and flagyl antibiotics, and the pain medication as needed  You will be contacted regarding the referral for: Referral to colorectal surgury for after May 1  You are given the work note today  Please continue all other medications as before, and refills have been done if requested.  Please have the pharmacy call with any other refills you may need.  Please keep your appointments with your specialists as you may have planned

## 2021-04-07 NOTE — Progress Notes (Signed)
Patient ID: Joseph Files., male   DOB: [birth year], 50 y.o.   MRN: [id number]        Chief Complaint: rectal pain       HPI:  Joseph Martinson. is a 50 y.o. male here after being seen in ED apr 24 with rectal pain now ongoing for over 1 week, dull, pressure and mass like feeling, worse with walking and standing, very difficult to work since he walks and stands there. Denies fever, chills, rectal d/c or prior hx of abscess. No immune compromise meds or illness.  Nothing else seems to make better or worse.  Had CT with suggestion but no definite abscess.  Pt treated with bactrim for possible cellulitis? But pt states no improvement, pain still 8/10, maybe worse, cannot work at all.       Wt Readings from Last 3 Encounters:  04/07/21 (!) 343 lb (155.6 kg)  03/11/21 (!) 349 lb (158.3 kg)  06/10/20 (!) 347 lb (157.4 kg)   BP Readings from Last 3 Encounters:  04/07/21 120/72  04/05/21 (!) 109/47  03/11/21 (!) 142/82         Past Medical History:  Diagnosis Date  . Arthritis   . GERD (gastroesophageal reflux disease)   . Hypertension   . Obesity   . OSA on CPAP   . Type 2 diabetes mellitus ([place])    followed by pcp  . Wears partial dentures    lower   Past Surgical History:  Procedure Laterality Date  . COLONOSCOPY WITH PROPOFOL  11/06/2012   Procedure: COLONOSCOPY WITH PROPOFOL;  Surgeon: Jerene Bears, MD;  Location: [facility];  Service: Gastroenterology;  Laterality: N/A;  Andrea/  . GASTRIC ROUX-EN-Y N/A 05/02/2018   Procedure: LAPAROSCOPIC ROUX-EN-Y GASTRIC BYPASS WITH UPPER ENDOSCOPY;  Surgeon: Excell Seltzer, MD;  Location: [facility];  Service: General;  Laterality: N/A;  . KNEE ARTHROSCOPY W/ ACL RECONSTRUCTION Right 11-24-2009   dr dean    reports that he quit smoking about 31 years ago. His smoking use included cigars. He has never used smokeless tobacco. He reports that he does not drink alcohol and does not use drugs. family history includes Aneurysm in his  father; Arthritis in his mother; Breast cancer in his mother; Cancer in an other family member; Diabetes in his father and mother; Hypertension in his brother, father, mother, and sister; Prostate cancer in his father; Rheum arthritis in his sister. Allergies  Allergen Reactions  . Benazepril Hcl Cough    Severe cough  . Tramadol Other (See Comments)    Severe dizziness   Current Outpatient Medications on File Prior to Visit  Medication Sig Dispense Refill  . blood glucose meter kit and supplies KIT Use to test blood sugar daily. DX E11.8 1 each 0  . Cholecalciferol 50 MCG (2000 UT) TABS Take 1 tablet (2,000 Units total) by mouth daily. 90 tablet 1  . Ferrous Sulfate (IRON PO) Take 1 tablet by mouth daily.    Marland Kitchen glucose blood (COOL BLOOD GLUCOSE TEST STRIPS) test strip Use to test blood sugar daily. DX E11.8 100 each 3  . hydrocortisone (ANUSOL-HC) 25 MG suppository Place 1 suppository (25 mg total) rectally 2 (two) times daily. 12 suppository 0  . indapamide (LOZOL) 1.25 MG tablet Take 1 tablet (1.25 mg total) by mouth daily. 90 tablet 1  . Insulin Pen Needle (BD PEN NEEDLE MICRO U/F) 32G X 6 MM MISC Use to inject insulin daily. DX E11.8 100 each 3  .  Lancets MISC Use to test blood sugar daily. DX E11.8 100 each 3  . Multiple Vitamins-Minerals (BARIATRIC MULTIVITAMINS/IRON PO) Take 1 tablet by mouth daily.    Marland Kitchen sulfamethoxazole-trimethoprim (BACTRIM DS) 800-160 MG tablet Take 1 tablet by mouth 2 (two) times daily for 7 days. 14 tablet 0  . thiamine (VITAMIN B-1) 100 MG tablet Take 0.5 tablets (50 mg total) by mouth daily. 45 tablet 1   No current facility-administered medications on file prior to visit.        ROS:  All others reviewed and negative.  Objective        PE:  BP 120/72   Pulse 77   Temp 98.2 F (36.8 C) (Oral)   Ht 5' 9"  (1.753 m)   Wt (!) 343 lb (155.6 kg)   SpO2 95%   BMI 50.65 kg/m                 Constitutional: Pt appears in NAD               HENT: Head: NCAT.                 Right Ear: External ear normal.                 Left Ear: External ear normal.                Eyes: . Pupils are equal, round, and reactive to light. Conjunctivae and EOM are normal               Nose: without d/c or deformity               Neck: Neck supple. Gross normal ROM               Cardiovascular: Normal rate and regular rhythm.                 Pulmonary/Chest: Effort normal and breath sounds without rales or wheezing.                Abd:  Soft, NT, ND, + BS, no organomegaly               Neurological: Pt is alert. At baseline orientation, motor grossly intact               Skin: Skin is warm. No rashes, no other new lesions, LE edema - none               Psychiatric: Pt behavior is normal without agitation   Micro: none  Cardiac tracings I have personally interpreted today:  none  Pertinent Radiological findings (summarize): Apr 05, 2021 CT abd /pelvis IMPRESSION: 1. Minimal soft tissue thickening and a tiny gas bubble posterior to the anus may correspond to the history of perirectal abscess. No discrete fluid collection is identified. 2. Moderate-sized esophageal hiatal hernia. 3. Postoperative gastric bypass.    Lab Results  Component Value Date   WBC 8.2 04/05/2021   HGB 13.9 04/05/2021   HCT 42.7 04/05/2021   PLT 236 04/05/2021   GLUCOSE 91 04/05/2021   CHOL 166 06/10/2020   TRIG 83.0 06/10/2020   HDL 51.40 06/10/2020   LDLCALC 98 06/10/2020   ALT 15 06/10/2020   AST 17 06/10/2020   NA 141 04/05/2021   K 4.2 04/05/2021   CL 105 04/05/2021   CREATININE 0.99 04/05/2021   BUN 15 04/05/2021   CO2 29 04/05/2021   TSH 2.38 06/10/2020  PSA 0.74 06/10/2020   HGBA1C 5.5 03/11/2021   MICROALBUR <0.7 06/10/2020   Assessment/Plan:  Joseph J Hout Jr. is a 50 y.o. American [place] or [place] Native [3] male with  has a past medical history of Arthritis, GERD (gastroesophageal reflux disease), Hypertension, Obesity, OSA on CPAP, Type 2 diabetes  mellitus ([place]), and Wears partial dentures.  Abscess of anal and rectal regions Possible, for pain control, change bactrim to cipro/flagyl, work note given, and referral to general surgury with pt request for after May 1  Type II diabetes mellitus with manifestations ([place]) Lab Results  Component Value Date   HGBA1C 5.5 03/11/2021   Stable, pt to continue current medical treatment - diet   HYPERTENSION, BENIGN ESSENTIAL BP Readings from Last 3 Encounters:  04/07/21 120/72  04/05/21 (!) 109/47  03/11/21 (!) 142/82   Stable, pt to continue medical treatment  - lozol   Followup: Return if symptoms worsen or fail to improve.  Cathlean Cower, MD 04/09/2021 9:35 PM [facility]

## 2021-04-08 NOTE — Telephone Encounter (Signed)
Spoke with pharmacist and she states that per CDC guidelines, since this is patient's first prescription for opiod and it is for acute pain, they can only dispense a 5 day supply which would be a quantity of 20 instead of 30. Pharmacist is getting prescription ready for patient to pick up. Just FYI.

## 2021-04-08 NOTE — Telephone Encounter (Signed)
Forwarded to prescriber

## 2021-04-08 NOTE — Telephone Encounter (Signed)
Noted thanks °

## 2021-04-09 ENCOUNTER — Encounter: Payer: Self-pay | Admitting: Internal Medicine

## 2021-04-09 NOTE — Assessment & Plan Note (Signed)
Lab Results  Component Value Date   HGBA1C 5.5 03/11/2021   Stable, pt to continue current medical treatment - diet

## 2021-04-09 NOTE — Assessment & Plan Note (Signed)
Possible, for pain control, change bactrim to cipro/flagyl, work note given, and referral to general surgury with pt request for after May 1

## 2021-04-09 NOTE — Assessment & Plan Note (Signed)
BP Readings from Last 3 Encounters:  04/07/21 120/72  04/05/21 (!) 109/47  03/11/21 (!) 142/82   Stable, pt to continue medical treatment  - lozol

## 2021-05-12 ENCOUNTER — Telehealth: Payer: Self-pay | Admitting: Internal Medicine

## 2021-05-12 NOTE — Telephone Encounter (Signed)
Patient called and is requesting a refill on the antibiotic that Dr. Jenny Reichmann called him in when he seen him on 04-07-21. He said that he thinks the infection is back. Appointment was offered but patient denied because insurance wont go into effect until the end of the month. Please advise    [employer]  [place], [place]

## 2021-05-12 NOTE — Telephone Encounter (Signed)
Very sorry, but I would not feel comfortable with further treatment without an exam; please consider ROV with PCP - Dr Ronnald Ramp

## 2021-05-13 NOTE — Telephone Encounter (Signed)
Notified patient that: Very sorry, but I would not feel comfortable with further treatment without an exam; please consider ROV with PCP - Dr Ronnald Ramp

## 2021-09-03 ENCOUNTER — Telehealth (INDEPENDENT_AMBULATORY_CARE_PROVIDER_SITE_OTHER): Payer: Self-pay | Admitting: Family Medicine

## 2021-09-03 DIAGNOSIS — U071 COVID-19: Secondary | ICD-10-CM

## 2021-09-03 MED ORDER — BENZONATATE 200 MG PO CAPS: 200.0000 mg | ORAL_CAPSULE | Freq: Two times a day (BID) | ORAL | 0 refills | Status: DC | PRN

## 2021-09-03 MED ORDER — NIRMATRELVIR/RITONAVIR (PAXLOVID)TABLET: 3.0000 | ORAL_TABLET | Freq: Two times a day (BID) | ORAL | 0 refills | Status: AC

## 2021-09-03 NOTE — Patient Instructions (Addendum)
---------------------------------------------------------------------------------------------------------------------------    WORK SLIP:  Patient [name],  [birth year], was seen for a medical visit today, 09/03/21 . Please excuse from work for a COVID like illness. We advise 10 days minimum from the onset of symptoms (09/01/21) PLUS 1 day of no fever and improved symptoms. Will defer to employer for a sooner return to work if symptoms have resolved, it is greater than 5 days since the positive test and the patient can wear a high-quality, tight fitting mask such as N95 or KN95 at all times for an additional 5 days. Would also suggest COVID19 antigen testing is negative prior to return.  Sincerely: E-signature: Dr. Colin Benton, DO [facility] [place] Ph: [phone]   ------------------------------------------------------------------------------------------------------------------------------   HOME CARE TIPS:   -I sent the medication(s) we discussed to your pharmacy: Meds ordered this encounter  Medications   nirmatrelvir/ritonavir EUA (PAXLOVID) 20 x 150 MG & 10 x 100MG TABS    Sig: Take 3 tablets by mouth 2 (two) times daily for 5 days. (Take nirmatrelvir 150 mg two tablets twice daily for 5 days and ritonavir 100 mg one tablet twice daily for 5 days) Patient GFR is > 60    Dispense:  30 tablet    Refill:  0   benzonatate (TESSALON) 200 MG capsule    Sig: Take 1 capsule (200 mg total) by mouth 2 (two) times daily as needed for cough.    Dispense:  20 capsule    Refill:  0     -I sent in the [facility] treatment or referral you requested per our discussion. Please see the information provided below and discuss further with the pharmacist/treatment team.  -If taking Paxlovid, please review all medications, supplement and over the counter drugs with your pharmacist and ask them to check for any interactions. Please make the following changes to your  regular medications while taking Paxlovid: *PLease hold your indapamide while taking the Paxlovid  -If taking Paxlovid, there is a chance of rebound illness after finishing your treatment. If you become sick again please isolate for an additional 5 days.    -can use tylenol if needed for fevers, aches and pains per instructions  -can use nasal saline a few times per day if you have nasal congestion  -stay hydrated, drink plenty of fluids and eat small healthy meals - avoid dairy   -follow up with your doctor in 2-3 days unless improving and feeling better  -stay home while sick, except to seek medical care. If you have COVID19, ideally it would be best to stay home for a full 10 days since the onset of symptoms PLUS one day of no fever and feeling better. Wear a good mask that fits snugly (such as N95 or KN95) if around others to reduce the risk of transmission.  It was nice to meet you today, and I really hope you are feeling better soon. I help [facility] out with telemedicine visits on Tuesdays and Thursdays and am available for visits on those days. If you have any concerns or questions following this visit please schedule a follow up visit with your Primary Care doctor or seek care at a local urgent care clinic to avoid delays in care.    Seek in person care or schedule a follow up video visit promptly if your symptoms worsen, new concerns arise or you are not improving with treatment. Call 911 and/or seek emergency care if your symptoms are severe or life  threatening.  FACT SHEET FOR PATIENTS, PARENTS, AND CAREGIVERS EMERGENCY USE AUTHORIZATION (EUA) OF PAXLOVID FOR CORONAVIRUS DISEASE 2019 (COVID-19) You are being given this Fact Sheet because your healthcare provider believes it is necessary to provide you with PAXLOVID for the treatment of mild-to-moderate coronavirus disease (COVID-19) caused by the SARS-CoV-2 virus. This Fact Sheet contains information to help you understand  the risks and benefits of taking the PAXLOVID you have received or may receive. The U.S. Food and Drug Administration (FDA) has issued an Emergency Use Authorization (EUA) to make PAXLOVID available during the COVID-19 pandemic (for more details about an EUA please see "What is an Emergency Use Authorization?" at the end of this document). PAXLOVID is not an FDA-approved medicine in the [place]. Read this Fact Sheet for information about PAXLOVID. Talk to your healthcare provider about your options or if you have any questions. It is your choice to take PAXLOVID.  What is COVID-19? COVID-19 is caused by a virus called a coronavirus. You can get COVID-19 through close contact with another person who has the virus. COVID-19 illnesses have ranged from very mild-to-severe, including illness resulting in death. While information so far suggests that most COVID-19 illness is mild, serious illness can happen and may cause some of your other medical conditions to become worse. Older people and people of all ages with severe, long lasting (chronic) medical conditions like heart disease, lung disease, and diabetes, for example seem to be at higher risk of being hospitalized for COVID-19.  What is PAXLOVID? PAXLOVID is an investigational medicine used to treat mild-to-moderate COVID-19 in adults and children [24 years of age and older weighing at least 48 pounds (90 kg)] with positive results of direct SARS-CoV-2 viral testing, and who are at high risk for progression to severe COVID-19, including hospitalization or death. PAXLOVID is investigational because it is still being studied. There is limited information about the safety and effectiveness of using PAXLOVID to treat people with mild-to-moderate COVID-19.  The FDA has authorized the emergency use of PAXLOVID for the treatment of mild-tomoderate COVID-19 in adults and children [87 years of age and older weighing at least 52 pounds (46  kg)] with a positive test for the virus that causes COVID-19, and who are at high risk for progression to severe COVID-19, including hospitalization or death, under an EUA. 1 Revised: 27 February 2021   What should I tell my healthcare provider before I take PAXLOVID? Tell your healthcare provider if you: ? Have any allergies ? Have liver or kidney disease ? Are pregnant or plan to become pregnant ? Are breastfeeding a child ? Have any serious illnesses  Tell your healthcare provider about all the medicines you take, including prescription and over-the-counter medicines, vitamins, and herbal supplements. Some medicines may interact with PAXLOVID and may cause serious side effects. Keep a list of your medicines to show your healthcare provider and pharmacist when you get a new medicine.  You can ask your healthcare provider or pharmacist for a list of medicines that interact with PAXLOVID. Do not start taking a new medicine without telling your healthcare provider. Your healthcare provider can tell you if it is safe to take PAXLOVID with other medicines.  Tell your healthcare provider if you are taking combined hormonal contraceptive. PAXLOVID may affect how your birth control pills work. Females who are able to become pregnant should use another effective alternative form of contraception or an additional barrier method of contraception. Talk to your healthcare provider if you  have any questions about contraceptive methods that might be right for you.  How do I take PAXLOVID? ? PAXLOVID consists of 2 medicines: nirmatrelvir and ritonavir. o Take 2 pink tablets of nirmatrelvir with 1 white tablet of ritonavir by mouth 2 times each day (in the morning and in the evening) for 5 days. For each dose, take all 3 tablets at the same time. o If you have kidney disease, talk to your healthcare provider. You may need a different dose. ? Swallow the tablets whole. Do not chew, break, or crush  the tablets. ? Take PAXLOVID with or without food. ? Do not stop taking PAXLOVID without talking to your healthcare provider, even if you feel better. ? If you miss a dose of PAXLOVID within 8 hours of the time it is usually taken, take it as soon as you remember. If you miss a dose by more than 8 hours, skip the missed dose and take the next dose at your regular time. Do not take 2 doses of PAXLOVID at the same time. ? If you take too much PAXLOVID, call your healthcare provider or go to the nearest hospital emergency room right away. ? If you are taking a ritonavir- or cobicistat-containing medicine to treat hepatitis C or Human Immunodeficiency Virus (HIV), you should continue to take your medicine as prescribed by your healthcare provider. 2 Revised: 27 February 2021    Talk to your healthcare provider if you do not feel better or if you feel worse after 5 days.  Who should generally not take PAXLOVID? Do not take PAXLOVID if: ? You are allergic to nirmatrelvir, ritonavir, or any of the ingredients in PAXLOVID. ? You are taking any of the following medicines: o Alfuzosin o Pethidine, propoxyphene o Ranolazine o Amiodarone, dronedarone, flecainide, propafenone, quinidine o Colchicine o Lurasidone, pimozide, clozapine o Dihydroergotamine, ergotamine, methylergonovine o Lovastatin, simvastatin o Sildenafil (Revatio) for pulmonary arterial hypertension (PAH) o Triazolam, oral midazolam o Apalutamide o Carbamazepine, phenobarbital, phenytoin o Rifampin o St. John's Wort (hypericum perforatum) Taking PAXLOVID with these medicines may cause serious or life-threatening side effects or affect how PAXLOVID works.  These are not the only medicines that may cause serious side effects if taken with PAXLOVID. PAXLOVID may increase or decrease the levels of multiple other medicines. It is very important to tell your healthcare provider about all of the medicines you are taking because  additional laboratory tests or changes in the dose of your other medicines may be necessary while you are taking PAXLOVID. Your healthcare provider may also tell you about specific symptoms to watch out for that may indicate that you need to stop or decrease the dose of some of your other medicines.  What are the important possible side effects of PAXLOVID? Possible side effects of PAXLOVID are: ? Allergic Reactions. Allergic reactions can happen in people taking PAXLOVID, even after only 1 dose. Stop taking PAXLOVID and call your healthcare provider right away if you get any of the following symptoms of an allergic reaction: o hives o trouble swallowing or breathing o swelling of the mouth, lips, or face o throat tightness o hoarseness 3 Revised: 27 February 2021  o skin rash ? Liver Problems. Tell your healthcare provider right away if you have any of these signs and symptoms of liver problems: loss of appetite, yellowing of your skin and the whites of eyes (jaundice), dark-colored urine, pale colored stools and itchy skin, stomach area (abdominal) pain. ? Resistance to HIV Medicines. If you  have untreated HIV infection, PAXLOVID may lead to some HIV medicines not working as well in the future. ? Other possible side effects include: o altered sense of taste o diarrhea o high blood pressure o muscle aches These are not all the possible side effects of PAXLOVID. Not many people have taken PAXLOVID. Serious and unexpected side effects may happen. PAXLOVID is still being studied, so it is possible that all of the risks are not known at this time.  What other treatment choices are there? Veklury (remdesivir) is FDA-approved for the treatment of mild-to-moderate SNKNL-97 in certain adults and children. Talk with your doctor to see if Marijean Heath is appropriate for you. Like PAXLOVID, FDA may also allow for the emergency use of other medicines to treat people with COVID-19. Go to  https://price.info/ for information on the emergency use of other medicines that are authorized by FDA to treat people with COVID-19. Your healthcare provider may talk with you about clinical trials for which you may be eligible. It is your choice to be treated or not to be treated with PAXLOVID. Should you decide not to receive it or for your child not to receive it, it will not change your standard medical care.  What if I am pregnant or breastfeeding? There is no experience treating pregnant women or breastfeeding mothers with PAXLOVID. For a mother and unborn baby, the benefit of taking PAXLOVID may be greater than the risk from the treatment. If you are pregnant, discuss your options and specific situation with your healthcare provider. It is recommended that you use effective barrier contraception or do not have sexual activity while taking PAXLOVID. If you are breastfeeding, discuss your options and specific situation with your healthcare provider. 4 Revised: 27 February 2021   How do I report side effects with PAXLOVID? Contact your healthcare provider if you have any side effects that bother you or do not go away. Report side effects to FDA MedWatch at SmoothHits.hu or call 1-800-FDA1088 or you can report side effects to [employer]. at the contact information provided below. Website Fax number Telephone number www.pfizersafetyreporting.com [phone] How should I store [place]? Store PAXLOVID tablets at room temperature, between 68?F to 77?F (20?C to 25?C). How can I learn more about COVID-19? ? Ask your healthcare provider. ? Visit https://jacobson-johnson.com/. ? Contact your local or state public health department. What is an Emergency Use Authorization (EUA)? The [place] FDA has made PAXLOVID available under an emergency access mechanism  called an Emergency Use Authorization (EUA). The EUA is supported by a Education officer, museum and Human Service (HHS) declaration that circumstances exist to justify the emergency use of drugs and biological products during the COVID-19 pandemic. PAXLOVID for the treatment of mild-to-moderate COVID-19 in adults and children [76 years of age and older weighing at least 43 pounds (19 kg)] with positive results of direct SARS-CoV-2 viral testing, and who are at high risk for progression to severe COVID-19, including hospitalization or death, has not undergone the same type of review as an FDA-approved product. In issuing an EUA under the EQAST-41 public health emergency, the FDA has determined, among other things, that based on the total amount of scientific evidence available including data from adequate and well-controlled clinical trials, if available, it is reasonable to believe that the product may be effective for diagnosing, treating, or preventing COVID-19, or a serious or life-threatening disease or condition caused by COVID-19; that the known and potential benefits of the product, when used to diagnose, treat,  or prevent such disease or condition, outweigh the known and potential risks of such product; and that there are no adequate, approved, and available alternatives. All of these criteria must be met to allow for the product to be used in the treatment of patients during the COVID-19 pandemic. The EUA for PAXLOVID is in effect for the duration of the COVID-19 declaration justifying emergency use of this product, unless terminated or revoked (after which the products may no longer be used under the EUA). 5 Revised: 27 February 2021     Additional Information For general questions, visit the website or call the telephone number provided below. Website Telephone number www.COVID19oralRx.com [phone] (1-877-C19-PACK) You can also go to www.pfizermedinfo.com or call  [phone] for more information. [id number] Revised: 27 February 2021  l

## 2021-09-03 NOTE — Progress Notes (Signed)
Virtual Visit via Video Note  I connected with St  on 09/03/21 at  8:20 AM EDT by a video enabled telemedicine application and verified that I am speaking with the correct person using two identifiers.  Location patient: home, [place] Location provider:work or home office Persons participating in the virtual visit: patient, provider  I discussed the limitations of evaluation and management by telemedicine and the availability of in person appointments. The patient expressed understanding and agreed to proceed.   HPI:  Acute telemedicine visit for Covid19: -Onset: 2 days ago; home test positive -Symptoms include: nasal congestion, HA, bodyaches, chills, cough -doing a little better today -Denies: fever, CP, SOB, NVD, inability to eat/drink/get out of bed -Pertinent past medical history: see below -Pertinent medication allergies:  Allergies  Allergen Reactions   Benazepril Hcl Cough    Severe cough   Tramadol Other (See Comments)    Severe dizziness  -COVID-19 vaccine status: 2 doses - no boosters -had labs in April, GFR stable > 60 -taking indapimide  ROS: See pertinent positives and negatives per HPI.  Past Medical History:  Diagnosis Date   Arthritis    GERD (gastroesophageal reflux disease)    Hypertension    Obesity    OSA on CPAP    Type 2 diabetes mellitus ([place])    followed by pcp   Wears partial dentures    lower    Past Surgical History:  Procedure Laterality Date   COLONOSCOPY WITH PROPOFOL  11/06/2012   Procedure: COLONOSCOPY WITH PROPOFOL;  Surgeon: Jerene Bears, MD;  Location: [facility];  Service: Gastroenterology;  Laterality: N/A;  Andrea/   GASTRIC ROUX-EN-Y N/A 05/02/2018   Procedure: LAPAROSCOPIC ROUX-EN-Y GASTRIC BYPASS WITH UPPER ENDOSCOPY;  Surgeon: Excell Seltzer, MD;  Location: [facility];  Service: General;  Laterality: N/A;   KNEE ARTHROSCOPY W/ ACL RECONSTRUCTION Right 11-24-2009   dr dean     Current Outpatient Medications:    benzonatate  (TESSALON) 200 MG capsule, Take 1 capsule (200 mg total) by mouth 2 (two) times daily as needed for cough., Disp: 20 capsule, Rfl: 0   nirmatrelvir/ritonavir EUA (PAXLOVID) 20 x 150 MG & 10 x 100MG TABS, Take 3 tablets by mouth 2 (two) times daily for 5 days. (Take nirmatrelvir 150 mg two tablets twice daily for 5 days and ritonavir 100 mg one tablet twice daily for 5 days) Patient GFR is > 60, Disp: 30 tablet, Rfl: 0   blood glucose meter kit and supplies KIT, Use to test blood sugar daily. DX E11.8, Disp: 1 each, Rfl: 0   Cholecalciferol 50 MCG (2000 UT) TABS, Take 1 tablet (2,000 Units total) by mouth daily., Disp: 90 tablet, Rfl: 1   glucose blood (COOL BLOOD GLUCOSE TEST STRIPS) test strip, Use to test blood sugar daily. DX E11.8, Disp: 100 each, Rfl: 3   hydrocortisone (ANUSOL-HC) 25 MG suppository, Place 1 suppository (25 mg total) rectally 2 (two) times daily., Disp: 12 suppository, Rfl: 0   indapamide (LOZOL) 1.25 MG tablet, Take 1 tablet (1.25 mg total) by mouth daily., Disp: 90 tablet, Rfl: 1   Insulin Pen Needle (BD PEN NEEDLE MICRO U/F) 32G X 6 MM MISC, Use to inject insulin daily. DX E11.8, Disp: 100 each, Rfl: 3   Lancets MISC, Use to test blood sugar daily. DX E11.8, Disp: 100 each, Rfl: 3   Multiple Vitamins-Minerals (BARIATRIC MULTIVITAMINS/IRON PO), Take 1 tablet by mouth daily., Disp: , Rfl:    thiamine (VITAMIN B-1) 100 MG tablet, Take  0.5 tablets (50 mg total) by mouth daily., Disp: 45 tablet, Rfl: 1  EXAM:  VITALS per patient if applicable:  GENERAL: alert, oriented, appears well and in no acute distress  HEENT: atraumatic, conjunttiva clear, no obvious abnormalities on inspection of external nose and ears  NECK: normal movements of the head and neck  LUNGS: on inspection no signs of respiratory distress, breathing rate appears normal, no obvious gross SOB, gasping or wheezing  CV: no obvious cyanosis  MS: moves all visible extremities without noticeable  abnormality  PSYCH/NEURO: pleasant and cooperative, no obvious depression or anxiety, speech and thought processing grossly intact  ASSESSMENT AND PLAN:  Discussed the following assessment and plan:  COVID-19   Discussed treatment options (infusions and oral options and risk of drug interactions), ideal treatment window, potential complications, isolation and precautions for COVID-19.  Discussed possibility of rebound with antivirals and the need to reisolate if it should occur for 5 days. Checked for/reviewed any labs done in the last 90 days with GFR listed in HPI if available. After lengthy discussion, the patient opted for treatment with Paxlovid due to being higher risk for complications of covid or severe disease and other factors. Discussed EUA status of this drug and the fact that there is preliminary limited knowledge of risks/interactions/side effects per EUA document vs possible benefits and precautions. This information was shared with patient during the visit and also was provided in patient instructions. He opted to hold the indapamide during treatment and monitor BP. He reports he has a BP cuff. Also, advised that patient discuss risks/interactions and use with pharmacist/treatment team as well. The patient did want a prescription for cough, Tessalon Rx sent.  Other symptomatic care measures summarized in patient instructions. Work/School slipped offered: provided in patient instructions Advised to seek prompt in person care if worsening, new symptoms arise, or if is not improving with treatment. Discussed options for inperson care if PCP office not available. Did let this patient know that I only do telemedicine on Tuesdays and Thursdays for [place]. Advised to schedule follow up visit with PCP or [facility] if any further questions or concerns to avoid delays in care.   I discussed the assessment and treatment plan with the patient. The patient was provided an opportunity to ask questions and  all were answered. The patient agreed with the plan and demonstrated an understanding of the instructions.     Lucretia Kern, DO

## 2021-10-01 DIAGNOSIS — Z23 Encounter for immunization: Secondary | ICD-10-CM | POA: Diagnosis not present

## 2021-10-15 ENCOUNTER — Other Ambulatory Visit: Payer: Self-pay | Admitting: Internal Medicine

## 2021-10-15 DIAGNOSIS — I10 Essential (primary) hypertension: Secondary | ICD-10-CM

## 2021-11-27 ENCOUNTER — Encounter (HOSPITAL_COMMUNITY): Payer: Self-pay | Admitting: *Deleted

## 2021-12-28 ENCOUNTER — Telehealth: Payer: Self-pay | Admitting: *Deleted

## 2021-12-28 NOTE — Telephone Encounter (Signed)
Patient is calling to request an extension on his  handicap placard -(temp-6 mos.). he is still having problems with his ankles and foot sometimes.Please advise.

## 2021-12-28 NOTE — Telephone Encounter (Signed)
Patient has been approved for another temp. Handicap placard per Dr Amalia Hailey, informed and he will be in sometimes this week to pick up.

## 2021-12-30 ENCOUNTER — Encounter: Payer: Self-pay | Admitting: Internal Medicine

## 2021-12-30 ENCOUNTER — Other Ambulatory Visit: Payer: Self-pay

## 2021-12-30 ENCOUNTER — Ambulatory Visit (INDEPENDENT_AMBULATORY_CARE_PROVIDER_SITE_OTHER): Payer: BC Managed Care – PPO | Admitting: Internal Medicine

## 2021-12-30 VITALS — BP 136/84 | HR 62 | Temp 97.8°F | Resp 16 | Ht 69.0 in | Wt 354.0 lb

## 2021-12-30 DIAGNOSIS — Z9884 Bariatric surgery status: Secondary | ICD-10-CM

## 2021-12-30 DIAGNOSIS — Z0001 Encounter for general adult medical examination with abnormal findings: Secondary | ICD-10-CM | POA: Diagnosis not present

## 2021-12-30 DIAGNOSIS — I10 Essential (primary) hypertension: Secondary | ICD-10-CM

## 2021-12-30 DIAGNOSIS — E5111 Dry beriberi: Secondary | ICD-10-CM | POA: Diagnosis not present

## 2021-12-30 DIAGNOSIS — M48061 Spinal stenosis, lumbar region without neurogenic claudication: Secondary | ICD-10-CM

## 2021-12-30 DIAGNOSIS — K219 Gastro-esophageal reflux disease without esophagitis: Secondary | ICD-10-CM | POA: Diagnosis not present

## 2021-12-30 DIAGNOSIS — Z0000 Encounter for general adult medical examination without abnormal findings: Secondary | ICD-10-CM | POA: Diagnosis not present

## 2021-12-30 DIAGNOSIS — E785 Hyperlipidemia, unspecified: Secondary | ICD-10-CM | POA: Diagnosis not present

## 2021-12-30 DIAGNOSIS — E559 Vitamin D deficiency, unspecified: Secondary | ICD-10-CM

## 2021-12-30 DIAGNOSIS — Z1159 Encounter for screening for other viral diseases: Secondary | ICD-10-CM | POA: Diagnosis not present

## 2021-12-30 DIAGNOSIS — G4733 Obstructive sleep apnea (adult) (pediatric): Secondary | ICD-10-CM

## 2021-12-30 DIAGNOSIS — R195 Other fecal abnormalities: Secondary | ICD-10-CM

## 2021-12-30 DIAGNOSIS — E118 Type 2 diabetes mellitus with unspecified complications: Secondary | ICD-10-CM | POA: Diagnosis not present

## 2021-12-30 DIAGNOSIS — Z125 Encounter for screening for malignant neoplasm of prostate: Secondary | ICD-10-CM | POA: Diagnosis not present

## 2021-12-30 DIAGNOSIS — K635 Polyp of colon: Secondary | ICD-10-CM | POA: Diagnosis not present

## 2021-12-30 DIAGNOSIS — Z6841 Body Mass Index (BMI) 40.0 and over, adult: Secondary | ICD-10-CM

## 2021-12-30 LAB — CBC WITH DIFFERENTIAL/PLATELET
Basophils Absolute: 0 10*3/uL (ref 0.0–0.1)
Basophils Relative: 0.7 % (ref 0.0–3.0)
Eosinophils Absolute: 0.1 10*3/uL (ref 0.0–0.7)
Eosinophils Relative: 2.2 % (ref 0.0–5.0)
HCT: 44.5 % (ref 39.0–52.0)
Hemoglobin: 14.5 g/dL (ref 13.0–17.0)
Lymphocytes Relative: 30.6 % (ref 12.0–46.0)
Lymphs Abs: 1.7 10*3/uL (ref 0.7–4.0)
MCHC: 32.6 g/dL (ref 30.0–36.0)
MCV: 89.3 fl (ref 78.0–100.0)
Monocytes Absolute: 0.5 10*3/uL (ref 0.1–1.0)
Monocytes Relative: 9.3 % (ref 3.0–12.0)
Neutro Abs: 3.2 10*3/uL (ref 1.4–7.7)
Neutrophils Relative %: 57.2 % (ref 43.0–77.0)
Platelets: 248 10*3/uL (ref 150.0–400.0)
RBC: 4.98 Mil/uL (ref 4.22–5.81)
RDW: 15.1 % (ref 11.5–15.5)
WBC: 5.5 10*3/uL (ref 4.0–10.5)

## 2021-12-30 LAB — HEPATIC FUNCTION PANEL
ALT: 20 U/L (ref 0–53)
AST: 23 U/L (ref 0–37)
Albumin: 4.3 g/dL (ref 3.5–5.2)
Alkaline Phosphatase: 69 U/L (ref 39–117)
Bilirubin, Direct: 0.2 mg/dL (ref 0.0–0.3)
Total Bilirubin: 0.8 mg/dL (ref 0.2–1.2)
Total Protein: 7 g/dL (ref 6.0–8.3)

## 2021-12-30 LAB — BASIC METABOLIC PANEL
BUN: 16 mg/dL (ref 6–23)
CO2: 31 mEq/L (ref 19–32)
Calcium: 9.5 mg/dL (ref 8.4–10.5)
Chloride: 105 mEq/L (ref 96–112)
Creatinine, Ser: 0.93 mg/dL (ref 0.40–1.50)
GFR: 95.73 mL/min (ref >60.00)
Glucose, Bld: 87 mg/dL (ref 70–99)
Potassium: 4.4 mEq/L (ref 3.5–5.1)
Sodium: 143 mEq/L (ref 135–145)

## 2021-12-30 LAB — HEMOGLOBIN A1C: Hgb A1c MFr Bld: 6.1 % (ref 4.6–6.5)

## 2021-12-30 LAB — URINALYSIS, ROUTINE W REFLEX MICROSCOPIC
Bilirubin Urine: NEGATIVE
Hgb urine dipstick: NEGATIVE
Ketones, ur: NEGATIVE
Leukocytes,Ua: NEGATIVE
Nitrite: NEGATIVE
Specific Gravity, Urine: 1.02 (ref 1.000–1.030)
Total Protein, Urine: NEGATIVE
Urine Glucose: NEGATIVE
Urobilinogen, UA: 0.2 (ref 0.0–1.0)
pH: 6 (ref 5.0–8.0)

## 2021-12-30 LAB — MICROALBUMIN / CREATININE URINE RATIO
Creatinine,U: 88.3 mg/dL
Microalb Creat Ratio: 0.8 mg/g (ref 0.0–30.0)
Microalb, Ur: <0.7 mg/dL (ref 0.0–1.9)

## 2021-12-30 LAB — LIPID PANEL
Cholesterol: 165 mg/dL (ref 0–200)
HDL: 57.8 mg/dL (ref >39.00)
LDL Cholesterol: 91 mg/dL (ref 0–99)
NonHDL: 107.34
Total CHOL/HDL Ratio: 3
Triglycerides: 81 mg/dL (ref 0.0–149.0)
VLDL: 16.2 mg/dL (ref 0.0–40.0)

## 2021-12-30 LAB — TSH: TSH: 2.74 u[IU]/mL (ref 0.35–5.50)

## 2021-12-30 LAB — PSA: PSA: 0.7 ng/mL (ref 0.10–4.00)

## 2021-12-30 MED ORDER — TIRZEPATIDE 2.5 MG/0.5ML ~~LOC~~ SOAJ: 2.5000 mg | SUBCUTANEOUS | 0 refills | Status: DC

## 2021-12-30 NOTE — Patient Instructions (Signed)

## 2021-12-30 NOTE — Progress Notes (Signed)
Subjective:  Patient ID: Joseph Wood., male    DOB: Apr 21, 1971  Age: 51 y.o. MRN: [id number]  CC: Annual Exam, Hypertension, Diabetes, Hyperlipidemia, and Back Pain  This visit occurred during the SARS-CoV-2 public health emergency.  Safety protocols were in place, including screening questions prior to the visit, additional usage of staff PPE, and extensive cleaning of exam room while observing appropriate contact time as indicated for disinfecting solutions.    HPI Joseph Wood. presents for a CPX and f/up -   He complains of weight gain.  He is not currently taking any antihypertensives.  He denies chest pain, shortness of breath, diaphoresis, dyspnea on exertion, or edema.  Outpatient Medications Prior to Visit  Medication Sig Dispense Refill   blood glucose meter kit and supplies KIT Use to test blood sugar daily. DX E11.8 1 each 0   Cholecalciferol 50 MCG (2000 UT) TABS Take 1 tablet (2,000 Units total) by mouth daily. 90 tablet 1   glucose blood (COOL BLOOD GLUCOSE TEST STRIPS) test strip Use to test blood sugar daily. DX E11.8 100 each 3   Insulin Pen Needle (BD PEN NEEDLE MICRO U/F) 32G X 6 MM MISC Use to inject insulin daily. DX E11.8 100 each 3   Lancets MISC Use to test blood sugar daily. DX E11.8 100 each 3   Multiple Vitamins-Minerals (BARIATRIC MULTIVITAMINS/IRON PO) Take 1 tablet by mouth daily.     benzonatate (TESSALON) 200 MG capsule Take 1 capsule (200 mg total) by mouth 2 (two) times daily as needed for cough. 20 capsule 0   hydrocortisone (ANUSOL-HC) 25 MG suppository Place 1 suppository (25 mg total) rectally 2 (two) times daily. 12 suppository 0   indapamide (LOZOL) 1.25 MG tablet Take 1 tablet (1.25 mg total) by mouth daily. 90 tablet 1   thiamine (VITAMIN B-1) 100 MG tablet Take 0.5 tablets (50 mg total) by mouth daily. 45 tablet 1   No facility-administered medications prior to visit.    ROS Review of Systems  Constitutional:  Positive for  unexpected weight change. Negative for appetite change, chills, diaphoresis, fatigue and fever.  HENT: Negative.    Respiratory:  Positive for apnea. Negative for cough, shortness of breath and wheezing.   Cardiovascular:  Negative for chest pain, palpitations and leg swelling.  Gastrointestinal:  Negative for abdominal pain, blood in stool, constipation, diarrhea, nausea and vomiting.  Endocrine: Negative.   Genitourinary:  Negative for difficulty urinating, frequency, penile discharge and scrotal swelling.  Musculoskeletal:  Positive for arthralgias and back pain. Negative for joint swelling and myalgias.       His has chronic LBP that radiates into his thighs  Skin: Negative.   Neurological:  Negative for dizziness, weakness, light-headedness, numbness and headaches.  Hematological:  Negative for adenopathy. Does not bruise/bleed easily.  Psychiatric/Behavioral: Negative.     Objective:  BP 136/84 (BP Location: Left Arm, Patient Position: Sitting, Cuff Size: Large) Comment: thigh cuff   Pulse 62    Temp 97.8 F (36.6 C) (Oral)    Resp 16    Ht 5' 9"  (1.753 m)    Wt (!) 354 lb (160.6 kg)    SpO2 98%    BMI 52.28 kg/m   BP Readings from Last 3 Encounters:  12/30/21 136/84  04/07/21 120/72  04/05/21 (!) 109/47    Wt Readings from Last 3 Encounters:  12/30/21 (!) 354 lb (160.6 kg)  04/07/21 (!) 343 lb (155.6 kg)  03/11/21 (!) 349 lb (  158.3 kg)    Physical Exam Vitals reviewed. Exam conducted with a chaperone present (his wife).  Constitutional:      Appearance: He is obese.  HENT:     Nose: Nose normal.     Mouth/Throat:     Mouth: Mucous membranes are moist.  Eyes:     General: No scleral icterus.    Conjunctiva/sclera: Conjunctivae normal.  Cardiovascular:     Rate and Rhythm: Normal rate and regular rhythm.     Heart sounds: No murmur heard.    Comments: EKG- NSR, 61 bpm Normal EKG Pulmonary:     Effort: Pulmonary effort is normal.     Breath sounds: No stridor. No  wheezing, rhonchi or rales.  Abdominal:     General: Abdomen is protuberant. Bowel sounds are normal. There is no distension.     Palpations: Abdomen is soft. There is no hepatomegaly, splenomegaly or mass.     Tenderness: There is no abdominal tenderness.     Hernia: No hernia is present. There is no hernia in the left inguinal area or right inguinal area.  Genitourinary:    Penis: Normal and uncircumcised. No erythema, discharge or swelling.      Testes: Normal.        Right: Mass, tenderness or swelling not present.        Left: Mass, tenderness or swelling not present.     Epididymis:     Right: Normal.     Left: Normal.     Prostate: Normal. Not enlarged, not tender and no nodules present.     Rectum: Guaiac result positive. Internal hemorrhoid present. No mass, tenderness, anal fissure or external hemorrhoid. Normal anal tone.  Musculoskeletal:     Cervical back: Neck supple.     Right lower leg: No edema.     Left lower leg: No edema.  Lymphadenopathy:     Cervical: No cervical adenopathy.     Lower Body: No right inguinal adenopathy. No left inguinal adenopathy.  Skin:    General: Skin is warm and dry.     Coloration: Skin is not pale.     Findings: No rash.  Neurological:     General: No focal deficit present.     Mental Status: He is alert. Mental status is at baseline.  Psychiatric:        Mood and Affect: Mood normal.        Behavior: Behavior normal.    Lab Results  Component Value Date   WBC 5.5 12/30/2021   HGB 14.5 12/30/2021   HCT 44.5 12/30/2021   PLT 248.0 12/30/2021   GLUCOSE 87 12/30/2021   CHOL 165 12/30/2021   TRIG 81.0 12/30/2021   HDL 57.80 12/30/2021   LDLCALC 91 12/30/2021   ALT 20 12/30/2021   AST 23 12/30/2021   NA 143 12/30/2021   K 4.4 12/30/2021   CL 105 12/30/2021   CREATININE 0.93 12/30/2021   BUN 16 12/30/2021   CO2 31 12/30/2021   TSH 2.74 12/30/2021   PSA 0.70 12/30/2021   HGBA1C 6.1 12/30/2021   MICROALBUR <0.7 12/30/2021     CT ABDOMEN PELVIS W CONTRAST  Result Date: 04/05/2021 CLINICAL DATA:  Rectal abscess for 1 week getting worse. Rectal pain. EXAM: CT ABDOMEN AND PELVIS WITH CONTRAST TECHNIQUE: Multidetector CT imaging of the abdomen and pelvis was performed using the standard protocol following bolus administration of intravenous contrast. CONTRAST:  184m OMNIPAQUE IOHEXOL 300 MG/ML  SOLN COMPARISON:  07/29/2015 FINDINGS: Lower  chest: Moderate-sized esophageal hiatal hernia. Lung bases are clear. Hepatobiliary: No focal liver abnormality is seen. No gallstones, gallbladder wall thickening, or biliary dilatation. Pancreas: Unremarkable. No pancreatic ductal dilatation or surrounding inflammatory changes. Spleen: Normal in size without focal abnormality. Adrenals/Urinary Tract: Adrenal glands are unremarkable. Kidneys are normal, without renal calculi, focal lesion, or hydronephrosis. Bladder is unremarkable. Stomach/Bowel: Postoperative changes consistent with gastric bypass. Stomach, small bowel, and colon are not abnormally distended. No wall thickening or inflammatory changes are identified. Appendix is not identified. Minimal soft tissue thickening and a tiny gas bubble demonstrated posterior to the anus may correspond to the history of perirectal abscess. No discrete fluid collection is identified. Vascular/Lymphatic: No significant vascular findings are present. No enlarged abdominal or pelvic lymph nodes. Reproductive: Prostate is unremarkable. Other: No free air or free fluid in the abdomen. Abdominal wall musculature appears intact. Musculoskeletal: Degenerative changes in the spine. No destructive bone lesions. IMPRESSION: 1. Minimal soft tissue thickening and a tiny gas bubble posterior to the anus may correspond to the history of perirectal abscess. No discrete fluid collection is identified. 2. Moderate-sized esophageal hiatal hernia. 3. Postoperative gastric bypass. Electronically Signed   By: Lucienne Capers  M.D.   On: 04/05/2021 19:21    Assessment & Plan:   Joseph Wood was seen today for annual exam, hypertension, diabetes, hyperlipidemia and back pain.  Diagnoses and all orders for this visit:  HYPERTENSION, BENIGN ESSENTIAL- He has not achieved his blood pressure goal of 130/80.  I encouraged him to improve his lifestyle modifications. -     Basic metabolic panel; Future -     Urinalysis, Routine w reflex microscopic; Future -     TSH; Future -     Hepatic function panel; Future -     EKG 12-Lead -     Hepatic function panel -     TSH -     Urinalysis, Routine w reflex microscopic -     Basic metabolic panel  Benign colon polyp -     Ambulatory referral to Gastroenterology  Gastroesophageal reflux disease without esophagitis- His symptoms are well controlled. -     CBC with Differential/Platelet; Future -     CBC with Differential/Platelet  Type II diabetes mellitus with manifestations ([place])- Will treat this with GIP/GLP agonist. -     Microalbumin / creatinine urine ratio; Future -     Hemoglobin A1c; Future -     HM Diabetes Foot Exam -     Hemoglobin A1c -     Microalbumin / creatinine urine ratio -     tirzepatide (MOUNJARO) 2.5 MG/0.5ML Pen; Inject 2.5 mg into the skin once a week.  Thiamine deficiency neuropathy -     Vitamin B1; Future -     Vitamin B1  Morbid obesity with BMI of 50.0-59.9, adult ([place])- He is committed to improving his lifestyle modifications.  Vitamin D deficiency disease  Hyperlipidemia with target LDL less than 100- Statin therapy is not indicated. -     TSH; Future -     TSH  Spinal stenosis, lumbar region without neurogenic claudication -     Ambulatory referral to Orthopedic Surgery  History of gastric bypass  Need for hepatitis C screening test -     Hepatitis C antibody; Future -     Hepatitis C antibody  Encounter for general adult medical examination with abnormal findings- Exam completed, labs reviewed, vaccines reviewed and  updated, cancer screenings addressed, patient education was given. -  Lipid panel; Future -     PSA; Future -     Hepatitis C antibody; Future -     HIV Antibody (routine testing w rflx); Future -     HIV Antibody (routine testing w rflx) -     Hepatitis C antibody -     PSA -     Lipid panel  Positive occult stool blood test -     Ambulatory referral to Gastroenterology  Obstructive sleep apnea -     Ambulatory referral to Sleep Studies   I have discontinued Tregan J. Miyasaki Jr.'s indapamide, hydrocortisone, and benzonatate. I am also having him start on tirzepatide. Additionally, I am having him maintain his Multiple Vitamins-Minerals (BARIATRIC MULTIVITAMINS/IRON PO), blood glucose meter kit and supplies, Lancets, Cholecalciferol, BD Pen Needle Micro U/F, Cool Blood Glucose Test Strips, and thiamine.  Meds ordered this encounter  Medications   tirzepatide (MOUNJARO) 2.5 MG/0.5ML Pen    Sig: Inject 2.5 mg into the skin once a week.    Dispense:  2 mL    Refill:  0   thiamine (VITAMIN B-1) 100 MG tablet    Sig: Take 0.5 tablets (50 mg total) by mouth daily.    Dispense:  45 tablet    Refill:  1     Follow-up: Return in about 3 months (around 03/30/2022).  Scarlette Calico, MD

## 2021-12-31 ENCOUNTER — Telehealth: Payer: Self-pay

## 2021-12-31 NOTE — Telephone Encounter (Signed)
Joseph Wood is calling from [employer] for a PA on tirzepatide [facility]) 2.5 MG/0.5ML Pen.

## 2021-12-31 NOTE — Telephone Encounter (Signed)
Key: [id number] Approved effective until 12/31/2022

## 2022-01-02 MED ORDER — VITAMIN B-1 100 MG PO TABS: 50.0000 mg | ORAL_TABLET | Freq: Every day | ORAL | 1 refills | Status: DC

## 2022-01-03 ENCOUNTER — Other Ambulatory Visit: Payer: Self-pay | Admitting: Internal Medicine

## 2022-01-03 LAB — HIV ANTIBODY (ROUTINE TESTING W REFLEX): HIV 1&2 Ab, 4th Generation: NONREACTIVE

## 2022-01-03 LAB — VITAMIN B1: Vitamin B1 (Thiamine): 7 nmol/L — ABNORMAL LOW (ref 8–30)

## 2022-01-03 LAB — HEPATITIS C ANTIBODY
Hepatitis C Ab: NONREACTIVE
SIGNAL TO CUT-OFF: 0.22 (ref <1.00)

## 2022-01-07 ENCOUNTER — Other Ambulatory Visit: Payer: Self-pay | Admitting: Internal Medicine

## 2022-01-07 DIAGNOSIS — G4733 Obstructive sleep apnea (adult) (pediatric): Secondary | ICD-10-CM

## 2022-01-14 ENCOUNTER — Telehealth: Payer: Self-pay | Admitting: Internal Medicine

## 2022-01-14 NOTE — Telephone Encounter (Signed)
Patient calling to schedule hospital colonoscopy also has a referral

## 2022-01-14 NOTE — Telephone Encounter (Signed)
Pt calling to schedule hospital colon. He was supposed to have a recall in 2018, he did not schedule. Then pt was scheduled for an OV in 2021 but cancelled that appt. Do you want this pt scheduled for an OV?  Please advise.

## 2022-01-14 NOTE — Telephone Encounter (Signed)
Recent PCP visit reviewed [facility] for direct hospital colon for BMI > 50

## 2022-01-15 NOTE — Telephone Encounter (Signed)
Pt scheduled for colon at [facility] 04/22/22 at 8:45am. Pt to arrive there at 7:15am. Amb ref in epic. Pt aware of appt. Prep sent to pharmacy and instructions sent to pt via mychart.

## 2022-01-15 NOTE — Telephone Encounter (Signed)
Joseph Wood will you add this pt to your waiting list of hospital procedures? Thanks.

## 2022-01-25 ENCOUNTER — Telehealth: Payer: Self-pay | Admitting: Internal Medicine

## 2022-01-25 DIAGNOSIS — E118 Type 2 diabetes mellitus with unspecified complications: Secondary | ICD-10-CM

## 2022-01-25 MED ORDER — TIRZEPATIDE 2.5 MG/0.5ML ~~LOC~~ SOAJ: 2.5000 mg | SUBCUTANEOUS | 0 refills | Status: DC

## 2022-01-25 NOTE — Telephone Encounter (Signed)
1.Medication Requested: tirzepatide [facility]) 2.5 MG/0.5ML Pen   2. Pharmacy (Name, Street, [place]): [place], [place]  Phone:  [phone] Fax:  [phone]   3. On Med List: yes  4. Last Visit with PCP: 01.18.23  5. Next visit date with PCP:n/a   Agent: Please be advised that RX refills may take up to 3 business days. We ask that you follow-up with your pharmacy.

## 2022-02-16 ENCOUNTER — Other Ambulatory Visit: Payer: Self-pay

## 2022-02-16 DIAGNOSIS — Z1211 Encounter for screening for malignant neoplasm of colon: Secondary | ICD-10-CM

## 2022-02-16 MED ORDER — NA SULFATE-K SULFATE-MG SULF 17.5-3.13-1.6 GM/177ML PO SOLN: ORAL | 0 refills | Status: DC

## 2022-02-28 ENCOUNTER — Encounter: Payer: Self-pay | Admitting: Internal Medicine

## 2022-03-01 ENCOUNTER — Other Ambulatory Visit: Payer: Self-pay | Admitting: Internal Medicine

## 2022-03-01 DIAGNOSIS — E118 Type 2 diabetes mellitus with unspecified complications: Secondary | ICD-10-CM

## 2022-03-01 MED ORDER — TIRZEPATIDE 5 MG/0.5ML ~~LOC~~ SOAJ
5.0000 mg | SUBCUTANEOUS | 0 refills | Status: DC
Start: 2022-03-01 — End: 2022-04-12

## 2022-03-10 ENCOUNTER — Institutional Professional Consult (permissible substitution): Payer: BC Managed Care – PPO | Admitting: Pulmonary Disease

## 2022-03-15 ENCOUNTER — Institutional Professional Consult (permissible substitution): Payer: BC Managed Care – PPO | Admitting: Primary Care

## 2022-03-23 ENCOUNTER — Institutional Professional Consult (permissible substitution): Payer: BC Managed Care – PPO | Admitting: Primary Care

## 2022-03-24 ENCOUNTER — Encounter: Payer: Self-pay | Admitting: Primary Care

## 2022-03-24 ENCOUNTER — Ambulatory Visit (INDEPENDENT_AMBULATORY_CARE_PROVIDER_SITE_OTHER): Payer: BC Managed Care – PPO | Admitting: Primary Care

## 2022-03-24 VITALS — BP 142/78 | HR 74 | Temp 98.4°F | Ht 70.0 in | Wt 338.4 lb

## 2022-03-24 DIAGNOSIS — G4733 Obstructive sleep apnea (adult) (pediatric): Secondary | ICD-10-CM

## 2022-03-24 DIAGNOSIS — Z8669 Personal history of other diseases of the nervous system and sense organs: Secondary | ICD-10-CM | POA: Diagnosis not present

## 2022-03-24 NOTE — Patient Instructions (Signed)
?  Sleep apnea is defined as period of 10 seconds or longer when you stop breathing at night. This can happen multiple times a night. Dx sleep apnea is when this occurs more than 5 times an hour.  ?  ?Mild OSA 5-15 apneic events an hour ?Moderate OSA 15-30 apneic events an hour ?Severe OSA > 30 apneic events an hour ?  ?Untreated sleep apnea puts you at higher risk for cardiac arrhythmias, pulmonary HTN, stroke and diabetes ?  ?Treatment options include weight loss, side sleeping position, oral appliance, CPAP therapy or referral to ENT for possible surgical options  ?  ?Recommendations: ?Focus on side sleeping position or elevated head of bed at night  ?Work on weight loss efforts  ?Do not drive if experiencing excessive daytime sleepiness of fatigue  ?  ?Orders: ?Home sleep study re: snoring (ordered) ?  ?Follow-up: ?Call after having sleep study to set up virtual visit to review sleep study results and discuss treatment options further ?

## 2022-03-24 NOTE — Progress Notes (Signed)
? ?@Patient  ID: Joseph Wood., male    DOB: 13-Sep-1971, 51 y.o.   MRN: [id number] ? ?No chief complaint on file. ? ? ?Referring provider: ?Janith Lima, MD ? ?HPI: ?51 year old male, former smoker.  Past medical history significant for obstructive sleep apnea, mild persistent asthma, hypertension, type 2 diabetes, morbid obesity status post gastric bypass. ? ?03/24/2022 ?Patient presents today for sleep consult.  Hx sleep apnea, has not worn CPAP in several years. He underwent gastric bypass surgery several years ago. He has lost 120lbs from his heaviest weight. He reports sleeping well at night. On rare occasion he will have some daytime fatigue. He typically works 12-16 hour shifts, right now he is on light duty d.t back pain. He works for [employer]. He no longer snores. No witness apnea. Typical bedtime is at 10pm. It may take him 30 mins to fall asleep. He wakes up on average twice a night. He starts his day at 5:30am. Denies daytime sleepiness or fatigue. Epworth 3.  ? ? ?Allergies  ?Allergen Reactions  ? Benazepril Hcl Cough  ?  Severe cough  ? Tramadol Other (See Comments)  ?  Severe dizziness  ? ? ?Immunization History  ?Administered Date(s) Administered  ? Influenza Split 09/05/2012, 09/19/2014  ? Influenza,inj,Quad PF,6+ Mos 08/29/2017, 08/14/2019  ? Influenza-Unspecified 09/18/2015, 09/23/2016, 09/15/2021  ? PNEUMOCOCCAL CONJUGATE-20 03/11/2021  ? Pneumococcal Polysaccharide-23 02/12/2015  ? Td 12/13/2008  ? Tdap 06/10/2020  ? ? ?Past Medical History:  ?Diagnosis Date  ? Arthritis   ? GERD (gastroesophageal reflux disease)   ? Hypertension   ? Obesity   ? OSA on CPAP   ? Type 2 diabetes mellitus ([place])   ? followed by pcp  ? Wears partial dentures   ? lower  ? ? ?Tobacco History: ?Social History  ? ?Tobacco Use  ?Smoking Status Former  ? Types: Cigars  ? Quit date: 04/24/1989  ? Years since quitting: 32.9  ?Smokeless Tobacco Never  ?Tobacco Comments  ?  occaional cigars, a couple a year  ? ?Counseling given: Not Answered ?Tobacco comments: occaional cigars, a couple a year ? ? ?Outpatient Medications Prior to Visit  ?Medication Sig Dispense Refill  ? Multiple Vitamins-Minerals (BARIATRIC MULTIVITAMINS/IRON PO) Take 1 tablet by mouth daily.    ? thiamine (VITAMIN B-1) 100 MG tablet Take 0.5 tablets (50 mg total) by mouth daily. 45 tablet 1  ? blood glucose meter kit and supplies KIT Use to test blood sugar daily. DX E11.8 (Patient not taking: Reported on 03/24/2022) 1 each 0  ? Cholecalciferol 50 MCG (2000 UT) TABS Take 1 tablet (2,000 Units total) by mouth daily. (Patient not taking: Reported on 03/24/2022) 90 tablet 1  ? glucose blood (COOL BLOOD GLUCOSE TEST STRIPS) test strip Use to test blood sugar daily. DX E11.8 (Patient not taking: Reported on 03/24/2022) 100 each 3  ? Insulin Pen Needle (BD PEN NEEDLE MICRO U/F) 32G X 6 MM MISC Use to inject insulin daily. DX E11.8 (Patient not taking: Reported on 03/24/2022) 100 each 3  ? Lancets MISC Use to test blood sugar daily. DX E11.8 (Patient not taking: Reported on 03/24/2022) 100 each 3  ? Na Sulfate-K Sulfate-Mg Sulf 17.5-3.13-1.6 GM/177ML SOLN Take as directed (Patient not taking: Reported on 03/24/2022) 354 mL 0  ? tirzepatide (MOUNJARO) 5 MG/0.5ML Pen Inject 5 mg into the skin once a week. (Patient not taking: Reported on 03/24/2022) 6 mL 0  ? ?No facility-administered  medications prior to visit.  ? ?Review of Systems ? ?Review of Systems  ?Constitutional: Negative.  Negative for fatigue.  ?HENT: Negative.    ?Respiratory: Negative.  Negative for apnea.   ?Psychiatric/Behavioral: Negative.    ? ? ?Physical Exam ? ?BP (!) 142/78 (BP Location: Right Arm, Patient Position: Sitting, Cuff Size: Normal)   Pulse 74   Temp 98.4 ?F (36.9 ?C) (Oral)   Ht 5' 10"  (1.778 m)   Wt (!) 338 lb 6.4 oz (153.5 kg)   SpO2 99%   BMI 48.56 kg/m?  ?Physical Exam ?Constitutional:   ?   General: He is not in acute distress. ?    Appearance: Normal appearance. He is obese. He is not ill-appearing.  ?HENT:  ?   Head: Normocephalic and atraumatic.  ?   Mouth/Throat:  ?   Mouth: Mucous membranes are moist.  ?   Pharynx: Oropharynx is clear.  ?Cardiovascular:  ?   Rate and Rhythm: Normal rate and regular rhythm.  ?Pulmonary:  ?   Effort: Pulmonary effort is normal.  ?   Breath sounds: Normal breath sounds.  ?Skin: ?   General: Skin is warm and dry.  ?Neurological:  ?   General: No focal deficit present.  ?   Mental Status: He is alert and oriented to person, place, and time. Mental status is at baseline.  ?Psychiatric:     ?   Mood and Affect: Mood normal.     ?   Behavior: Behavior normal.     ?   Thought Content: Thought content normal.     ?   Judgment: Judgment normal.  ?  ? ?Lab Results: ? ?CBC ?   ?Component Value Date/Time  ? WBC 5.5 12/30/2021 1140  ? RBC 4.98 12/30/2021 1140  ? HGB 14.5 12/30/2021 1140  ? HCT 44.5 12/30/2021 1140  ? PLT 248.0 12/30/2021 1140  ? MCV 89.3 12/30/2021 1140  ? MCH 29.8 04/05/2021 1751  ? MCHC 32.6 12/30/2021 1140  ? RDW 15.1 12/30/2021 1140  ? LYMPHSABS 1.7 12/30/2021 1140  ? MONOABS 0.5 12/30/2021 1140  ? EOSABS 0.1 12/30/2021 1140  ? BASOSABS 0.0 12/30/2021 1140  ? ? ?BMET ?   ?Component Value Date/Time  ? NA 143 12/30/2021 1140  ? K 4.4 12/30/2021 1140  ? CL 105 12/30/2021 1140  ? CO2 31 12/30/2021 1140  ? GLUCOSE 87 12/30/2021 1140  ? BUN 16 12/30/2021 1140  ? CREATININE 0.93 12/30/2021 1140  ? CALCIUM 9.5 12/30/2021 1140  ? GFRNONAA >60 04/05/2021 1751  ? GFRAA >60 04/24/2018 1210  ? ? ?BNP ?No results found for: BNP ? ?ProBNP ?No results found for: PROBNP ? ?Imaging: ?No results found. ? ? ?Assessment & Plan:  ? ?Obstructive sleep apnea ?- HST in June 2016 >> AHI 17/hr. Previously on CPAP, no longer using. Patient has lost 120lb from his heaviest. No symptoms of snoring or witnessed apnea. Epworth 3. BMI 48. Needs HST to re-evaluate degree of sleep apnea since weight loss. Discussed risk of untreated  sleep apnea including cardiac arrhythmias, pulm HTN, stroke, DM. We briefly reviewed treatment options. He is open to resuming CPAP if needed. Encouraged patient to continue to work on weight loss efforts and focus on side sleeping position/elevate head of bed. Advised against driving if experiencing excessive daytime sleepiness. Follow-up in 4-6 weeks to review sleep study results and discuss treatment options further. ? ? ?Martyn Ehrich, NP ?03/24/2022 ? ?

## 2022-03-24 NOTE — Assessment & Plan Note (Signed)
-   HST in June 2016 >> AHI 17/hr. Previously on CPAP, no longer using. Patient has lost 120lb from his heaviest. No symptoms of snoring or witnessed apnea. Epworth 3. BMI 48. Needs HST to re-evaluate degree of sleep apnea since weight loss. Discussed risk of untreated sleep apnea including cardiac arrhythmias, pulm HTN, stroke, DM. We briefly reviewed treatment options. He is open to resuming CPAP if needed. Encouraged patient to continue to work on weight loss efforts and focus on side sleeping position/elevate head of bed. Advised against driving if experiencing excessive daytime sleepiness. Follow-up in 4-6 weeks to review sleep study results and discuss treatment options further. ?

## 2022-03-24 NOTE — Progress Notes (Signed)
? ?_0  ID: Joseph Files., male    DOB: 1971-11-09, 51 y.o.   MRN: [id number] ? ?No chief complaint on file. ? ? ?Referring provider: ?Janith Lima, MD ? ?HPI: ? ? ? ? ? ? ? ?Allergies  ?Allergen Reactions  ? Benazepril Hcl Cough  ?  Severe cough  ? Tramadol Other (See Comments)  ?  Severe dizziness  ? ? ?Immunization History  ?Administered Date(s) Administered  ? Influenza Split 09/05/2012, 09/19/2014  ? Influenza,inj,Quad PF,6+ Mos 08/29/2017, 08/14/2019  ? Influenza-Unspecified 09/18/2015, 09/23/2016, 09/15/2021  ? PNEUMOCOCCAL CONJUGATE-20 03/11/2021  ? Pneumococcal Polysaccharide-23 02/12/2015  ? Td 12/13/2008  ? Tdap 06/10/2020  ? ? ?Past Medical History:  ?Diagnosis Date  ? Arthritis   ? GERD (gastroesophageal reflux disease)   ? Hypertension   ? Obesity   ? OSA on CPAP   ? Type 2 diabetes mellitus ([place])   ? followed by pcp  ? Wears partial dentures   ? lower  ? ? ?Tobacco History: ?Social History  ? ?Tobacco Use  ?Smoking Status Former  ? Types: Cigars  ? Quit date: 04/24/1989  ? Years since quitting: 32.9  ?Smokeless Tobacco Never  ?Tobacco Comments  ? occaional cigars, a couple a year  ? ?Counseling given: Not Answered ?Tobacco comments: occaional cigars, a couple a year ? ? ?Outpatient Medications Prior to Visit  ?Medication Sig Dispense Refill  ? Multiple Vitamins-Minerals (BARIATRIC MULTIVITAMINS/IRON PO) Take 1 tablet by mouth daily.    ? thiamine (VITAMIN B-1) 100 MG tablet Take 0.5 tablets (50 mg total) by mouth daily. 45 tablet 1  ? blood glucose meter kit and supplies KIT Use to test blood sugar daily. DX E11.8 (Patient not taking: Reported on 03/24/2022) 1 each 0  ? Cholecalciferol 50 MCG (2000 UT) TABS Take 1 tablet (2,000 Units total) by mouth daily. (Patient not taking: Reported on 03/24/2022) 90 tablet 1  ? glucose blood (COOL BLOOD GLUCOSE TEST STRIPS) test strip Use to test blood sugar daily. DX E11.8 (Patient not taking: Reported on 03/24/2022) 100 each 3  ? Insulin Pen Needle (BD  PEN NEEDLE MICRO U/F) 32G X 6 MM MISC Use to inject insulin daily. DX E11.8 (Patient not taking: Reported on 03/24/2022) 100 each 3  ? Lancets MISC Use to test blood sugar daily. DX E11.8 (Patient not taking: Reported on 03/24/2022) 100 each 3  ? Na Sulfate-K Sulfate-Mg Sulf 17.5-3.13-1.6 GM/177ML SOLN Take as directed (Patient not taking: Reported on 03/24/2022) 354 mL 0  ? tirzepatide (MOUNJARO) 5 MG/0.5ML Pen Inject 5 mg into the skin once a week. (Patient not taking: Reported on 03/24/2022) 6 mL 0  ? ?No facility-administered medications prior to visit.  ? ? ? ? ?Review of Systems ? ?Review of Systems ? ? ?Physical Exam ? ?BP (!) 142/78 (BP Location: Right Arm, Patient Position: Sitting, Cuff Size: Normal)   Pulse 74   Temp 98.4 ?F (36.9 ?C) (Oral)   Ht _1  (1.778 m)   Wt (!) 338 lb 6.4 oz (153.5 kg)   SpO2 99%   BMI 48.56 kg/m?  ?Physical Exam  ? ?Lab Results: ? ?CBC ?   ?Component Value Date/Time  ? WBC 5.5 12/30/2021 1140  ? RBC 4.98 12/30/2021 1140  ? HGB 14.5 12/30/2021 1140  ? HCT 44.5 12/30/2021 1140  ? PLT 248.0 12/30/2021 1140  ? MCV 89.3 12/30/2021 1140  ? MCH 29.8 04/05/2021 1751  ? MCHC 32.6 12/30/2021 1140  ? RDW 15.1 12/30/2021 1140  ?  LYMPHSABS 1.7 12/30/2021 1140  ? MONOABS 0.5 12/30/2021 1140  ? EOSABS 0.1 12/30/2021 1140  ? BASOSABS 0.0 12/30/2021 1140  ? ? ?BMET ?   ?Component Value Date/Time  ? NA 143 12/30/2021 1140  ? K 4.4 12/30/2021 1140  ? CL 105 12/30/2021 1140  ? CO2 31 12/30/2021 1140  ? GLUCOSE 87 12/30/2021 1140  ? BUN 16 12/30/2021 1140  ? CREATININE 0.93 12/30/2021 1140  ? CALCIUM 9.5 12/30/2021 1140  ? GFRNONAA >60 04/05/2021 1751  ? GFRAA >60 04/24/2018 1210  ? ? ?BNP ?No results found for: BNP ? ?ProBNP ?No results found for: PROBNP ? ?Imaging: ?No results found. ? ? ?Assessment & Plan:  ? ?No problem-specific Assessment & Plan notes found for this encounter. ? ? ? ? ?Martyn Ehrich, NP ?03/24/2022 ? ?

## 2022-03-25 NOTE — Progress Notes (Signed)
Reviewed and agree with assessment/plan. ? ? ?Chesley Mires, MD ?[place] ?03/25/2022, 11:15 AM ?Pager:  [phone] ? ?

## 2022-03-27 ENCOUNTER — Other Ambulatory Visit: Payer: Self-pay | Admitting: Internal Medicine

## 2022-03-27 DIAGNOSIS — E118 Type 2 diabetes mellitus with unspecified complications: Secondary | ICD-10-CM

## 2022-03-28 ENCOUNTER — Other Ambulatory Visit: Payer: Self-pay | Admitting: Internal Medicine

## 2022-04-12 ENCOUNTER — Ambulatory Visit (INDEPENDENT_AMBULATORY_CARE_PROVIDER_SITE_OTHER): Payer: BC Managed Care – PPO | Admitting: Internal Medicine

## 2022-04-12 ENCOUNTER — Encounter: Payer: Self-pay | Admitting: Internal Medicine

## 2022-04-12 VITALS — BP 136/84 | HR 70 | Temp 97.8°F | Ht 70.0 in | Wt 326.0 lb

## 2022-04-12 DIAGNOSIS — I10 Essential (primary) hypertension: Secondary | ICD-10-CM | POA: Diagnosis not present

## 2022-04-12 DIAGNOSIS — E118 Type 2 diabetes mellitus with unspecified complications: Secondary | ICD-10-CM | POA: Diagnosis not present

## 2022-04-12 DIAGNOSIS — M48061 Spinal stenosis, lumbar region without neurogenic claudication: Secondary | ICD-10-CM

## 2022-04-12 DIAGNOSIS — Z23 Encounter for immunization: Secondary | ICD-10-CM

## 2022-04-12 MED ORDER — TIRZEPATIDE 7.5 MG/0.5ML ~~LOC~~ SOAJ: 7.5000 mg | SUBCUTANEOUS | 0 refills | Status: DC

## 2022-04-12 NOTE — Progress Notes (Signed)
? ?Subjective:  ?Patient ID: Joseph Wood., male    DOB: 1971-09-11  Age: 51 y.o. MRN: [id number] ? ?CC: Diabetes, Hypertension, and Back Pain ? ? ?HPI ?Joseph Wood. presents for f/up - ? ?He needs a referral to a new back Psychologist, sport and exercise.  He is losing weight with Mounjaro.  He denies chest pain, shortness of breath, or diaphoresis. ? ?Outpatient Medications Prior to Visit  ?Medication Sig Dispense Refill  ? blood glucose meter kit and supplies KIT Use to test blood sugar daily. DX E11.8 1 each 0  ? Cholecalciferol 50 MCG (2000 UT) TABS Take 1 tablet (2,000 Units total) by mouth daily. 90 tablet 1  ? glucose blood (COOL BLOOD GLUCOSE TEST STRIPS) test strip Use to test blood sugar daily. DX E11.8 100 each 3  ? Insulin Pen Needle (BD PEN NEEDLE MICRO U/F) 32G X 6 MM MISC Use to inject insulin daily. DX E11.8 100 each 3  ? Lancets MISC Use to test blood sugar daily. DX E11.8 100 each 3  ? Multiple Vitamins-Minerals (BARIATRIC MULTIVITAMINS/IRON PO) Take 1 tablet by mouth daily.    ? thiamine (VITAMIN B-1) 100 MG tablet Take 0.5 tablets (50 mg total) by mouth daily. 45 tablet 1  ? tirzepatide (MOUNJARO) 5 MG/0.5ML Pen Inject 5 mg into the skin once a week. 6 mL 0  ? ?No facility-administered medications prior to visit.  ? ? ?ROS ?Review of Systems  ?Constitutional: Negative.  Negative for diaphoresis and fatigue.  ?HENT: Negative.    ?Eyes: Negative.   ?Respiratory:  Negative for cough, chest tightness, shortness of breath and wheezing.   ?Cardiovascular:  Negative for chest pain, palpitations and leg swelling.  ?Gastrointestinal:  Negative for abdominal pain, constipation, diarrhea, nausea and vomiting.  ?Endocrine: Negative.   ?Genitourinary: Negative.  Negative for difficulty urinating.  ?Musculoskeletal:  Positive for back pain. Negative for myalgias.  ?Skin: Negative.   ?Neurological:  Negative for dizziness, weakness, light-headedness and headaches.  ?Hematological:  Negative for adenopathy. Does not  bruise/bleed easily.  ?Psychiatric/Behavioral: Negative.    ? ?Objective:  ?BP 136/84 (BP Location: Left Arm, Patient Position: Sitting, Cuff Size: Large)   Pulse 70   Temp 97.8 ?F (36.6 ?C) (Oral)   Ht 5' 10"  (1.778 m)   Wt (!) 326 lb (147.9 kg)   SpO2 95%   BMI 46.78 kg/m?  ? ?BP Readings from Last 3 Encounters:  ?04/12/22 136/84  ?03/24/22 (!) 142/78  ?12/30/21 136/84  ? ? ?Wt Readings from Last 3 Encounters:  ?04/12/22 (!) 326 lb (147.9 kg)  ?03/24/22 (!) 338 lb 6.4 oz (153.5 kg)  ?12/30/21 (!) 354 lb (160.6 kg)  ? ? ?Physical Exam ?Vitals reviewed.  ?Constitutional:   ?   Appearance: He is obese. He is not ill-appearing.  ?HENT:  ?   Nose: Nose normal.  ?   Mouth/Throat:  ?   Mouth: Mucous membranes are moist.  ?Eyes:  ?   General: No scleral icterus. ?   Conjunctiva/sclera: Conjunctivae normal.  ?Cardiovascular:  ?   Rate and Rhythm: Normal rate and regular rhythm.  ?   Heart sounds: No murmur heard. ?Pulmonary:  ?   Effort: Pulmonary effort is normal.  ?   Breath sounds: No stridor. No wheezing, rhonchi or rales.  ?Abdominal:  ?   General: Abdomen is protuberant. Bowel sounds are normal. There is no distension.  ?   Palpations: Abdomen is soft. There is no hepatomegaly, splenomegaly or mass.  ?  Tenderness: There is no abdominal tenderness.  ?Musculoskeletal:     ?   General: Normal range of motion.  ?   Cervical back: Neck supple.  ?   Right lower leg: No edema.  ?   Left lower leg: No edema.  ?Lymphadenopathy:  ?   Cervical: No cervical adenopathy.  ?Skin: ?   General: Skin is warm and dry.  ?Neurological:  ?   General: No focal deficit present.  ?   Mental Status: Mental status is at baseline.  ?Psychiatric:     ?   Mood and Affect: Mood normal.     ?   Behavior: Behavior normal.  ? ? ?Lab Results  ?Component Value Date  ? WBC 5.5 12/30/2021  ? HGB 14.5 12/30/2021  ? HCT 44.5 12/30/2021  ? PLT 248.0 12/30/2021  ? GLUCOSE 87 12/30/2021  ? CHOL 165 12/30/2021  ? TRIG 81.0 12/30/2021  ? HDL 57.80  12/30/2021  ? [place] 91 12/30/2021  ? ALT 20 12/30/2021  ? AST 23 12/30/2021  ? NA 143 12/30/2021  ? K 4.4 12/30/2021  ? CL 105 12/30/2021  ? CREATININE 0.93 12/30/2021  ? BUN 16 12/30/2021  ? CO2 31 12/30/2021  ? TSH 2.74 12/30/2021  ? PSA 0.70 12/30/2021  ? HGBA1C 6.1 12/30/2021  ? MICROALBUR <0.7 12/30/2021  ? ? ?CT ABDOMEN PELVIS W CONTRAST ? ?Result Date: 04/05/2021 ?CLINICAL DATA:  Rectal abscess for 1 week getting worse. Rectal pain. EXAM: CT ABDOMEN AND PELVIS WITH CONTRAST TECHNIQUE: Multidetector CT imaging of the abdomen and pelvis was performed using the standard protocol following bolus administration of intravenous contrast. CONTRAST:  147m OMNIPAQUE IOHEXOL 300 MG/ML  SOLN COMPARISON:  07/29/2015 FINDINGS: Lower chest: Moderate-sized esophageal hiatal hernia. Lung bases are clear. Hepatobiliary: No focal liver abnormality is seen. No gallstones, gallbladder wall thickening, or biliary dilatation. Pancreas: Unremarkable. No pancreatic ductal dilatation or surrounding inflammatory changes. Spleen: Normal in size without focal abnormality. Adrenals/Urinary Tract: Adrenal glands are unremarkable. Kidneys are normal, without renal calculi, focal lesion, or hydronephrosis. Bladder is unremarkable. Stomach/Bowel: Postoperative changes consistent with gastric bypass. Stomach, small bowel, and colon are not abnormally distended. No wall thickening or inflammatory changes are identified. Appendix is not identified. Minimal soft tissue thickening and a tiny gas bubble demonstrated posterior to the anus may correspond to the history of perirectal abscess. No discrete fluid collection is identified. Vascular/Lymphatic: No significant vascular findings are present. No enlarged abdominal or pelvic lymph nodes. Reproductive: Prostate is unremarkable. Other: No free air or free fluid in the abdomen. Abdominal wall musculature appears intact. Musculoskeletal: Degenerative changes in the spine. No destructive bone  lesions. IMPRESSION: 1. Minimal soft tissue thickening and a tiny gas bubble posterior to the anus may correspond to the history of perirectal abscess. No discrete fluid collection is identified. 2. Moderate-sized esophageal hiatal hernia. 3. Postoperative gastric bypass. Electronically Signed   By: WLucienne CapersM.D.   On: 04/05/2021 19:21  ? ? ?Assessment & Plan:  ? ?Estefan was seen today for diabetes, hypertension and back pain. ? ?Diagnoses and all orders for this visit: ? ?HYPERTENSION, BENIGN ESSENTIAL- His BP is well controlled. ? ?Type II diabetes mellitus with manifestations ([place] ?-     tirzepatide (MOUNJARO) 7.5 MG/0.5ML Pen; Inject 7.5 mg into the skin once a week. ? ?Spinal stenosis, lumbar region without neurogenic claudication ?-     Ambulatory referral to Orthopedic Surgery ? ?Other orders ?-     Varicella-zoster vaccine IM (Shingrix) ? ? ?  I have discontinued Demont J. Koller Jr.'s tirzepatide. I am also having him start on tirzepatide. Additionally, I am having him maintain his Multiple Vitamins-Minerals (BARIATRIC MULTIVITAMINS/IRON PO), blood glucose meter kit and supplies, Lancets, Cholecalciferol, BD Pen Needle Micro U/F, Cool Blood Glucose Test Strips, and thiamine. ? ?Meds ordered this encounter  ?Medications  ? tirzepatide (MOUNJARO) 7.5 MG/0.5ML Pen  ?  Sig: Inject 7.5 mg into the skin once a week.  ?  Dispense:  6 mL  ?  Refill:  0  ? ? ? ?Follow-up: Return in about 6 months (around 10/13/2022). ? ?Scarlette Calico, MD ?

## 2022-04-12 NOTE — Patient Instructions (Signed)

## 2022-04-13 ENCOUNTER — Telehealth: Payer: Self-pay | Admitting: Internal Medicine

## 2022-04-13 ENCOUNTER — Encounter: Payer: Self-pay | Admitting: Internal Medicine

## 2022-04-13 NOTE — Telephone Encounter (Signed)
Pt states workers comp will not pay benefits due to his back pain ? ?Pt states he needs a release note from the provider stating his is able to return to work on full duty due to financial reasons, emailed ? ?Pt also requesting a work excuse note for yesterdays visit  ? ? ?Email: [email or web address] ?

## 2022-04-14 ENCOUNTER — Encounter: Payer: Self-pay | Admitting: Internal Medicine

## 2022-04-14 ENCOUNTER — Encounter: Payer: Self-pay | Admitting: *Deleted

## 2022-04-15 ENCOUNTER — Telehealth: Payer: Self-pay | Admitting: Internal Medicine

## 2022-04-15 ENCOUNTER — Encounter (HOSPITAL_COMMUNITY): Payer: Self-pay | Admitting: Internal Medicine

## 2022-04-15 MED ORDER — HYDROCORTISONE ACETATE 25 MG RE SUPP: 25.0000 mg | Freq: Every day | RECTAL | 0 refills | Status: DC

## 2022-04-15 MED ORDER — CIPROFLOXACIN HCL 500 MG PO TABS: 500.0000 mg | ORAL_TABLET | Freq: Two times a day (BID) | ORAL | 0 refills | Status: DC

## 2022-04-15 NOTE — Telephone Encounter (Signed)
Patient called and stated the lump has returned on the inside of his rectum.  When he had it previously, he was prescribed antibiotics and a suppository.  He doesn't want this to interfere with his upcoming procedure.  Please call patient and advise.  Thank you. ?

## 2022-04-15 NOTE — Telephone Encounter (Signed)
Ok to refill ?Bactrim DS 1 tab BID x 7 days ?Anusol supp 1 qHS x 5-7 days  ?

## 2022-04-15 NOTE — Telephone Encounter (Signed)
Pt is scheduled for colon at hospital and has been waiting for this appt. Reports he has the bump on the inside of his rectum like he did 04/05/21. He was seen in the er and given bactrim and anusol supp. Pt states this is coming back and he can feel it. He does not want this to interfere with his upcoming procedure that is scheduled for 04/22/22. ?

## 2022-04-15 NOTE — Telephone Encounter (Signed)
Spoke with pt and he now states that the bactrim did not work and his PCP sent in a different antibiotic. Looked back at med history and Dr. Jenny Reichmann sent in Cipro 517m bid for 10 days. Please advise if ok to send in Cipro instead of bactrim. ?

## 2022-04-15 NOTE — Telephone Encounter (Signed)
Prescriptions sent to pharmacy as ordered. Pt aware. ?

## 2022-04-15 NOTE — Telephone Encounter (Signed)
Cipro 500 mg BID x 7 days ?

## 2022-04-16 DIAGNOSIS — M5431 Sciatica, right side: Secondary | ICD-10-CM | POA: Diagnosis not present

## 2022-04-19 ENCOUNTER — Encounter: Payer: Self-pay | Admitting: Internal Medicine

## 2022-04-21 NOTE — Anesthesia Preprocedure Evaluation (Addendum)
Anesthesia Evaluation  ?Patient identified by MRN, date of birth, ID band ?Patient awake ? ? ? ?Reviewed: ?Allergy & Precautions, NPO status , Patient's Chart, lab work & pertinent test results ? ?Airway ?Mallampati: III ? ?TM Distance: >3 FB ?Neck ROM: Full ? ? ? Dental ?no notable dental hx. ? ?  ?Pulmonary ?neg pulmonary ROS, asthma , sleep apnea and Continuous Positive Airway Pressure Ventilation , former smoker,  ?  ?Pulmonary exam normal ?breath sounds clear to auscultation ? ? ? ? ? ? Cardiovascular ?hypertension, negative cardio ROS ?Normal cardiovascular exam ?Rhythm:Regular Rate:Normal ? ? ?  ?Neuro/Psych ? Neuromuscular disease (neuropathy) negative neurological ROS ? negative psych ROS  ? GI/Hepatic ?negative GI ROS, Neg liver ROS, GERD  Medicated,  ?Endo/Other  ?negative endocrine ROSdiabetes, Type 2, Insulin Dependent ? Renal/GU ?negative Renal ROS  ?negative genitourinary ?  ?Musculoskeletal ?negative musculoskeletal ROS ?(+) Arthritis ,  ? Abdominal ?  ?Peds ?negative pediatric ROS ?(+)  Hematology ?negative hematology ROS ?(+)   ?Anesthesia Other Findings ? ? Reproductive/Obstetrics ?negative OB ROS ? ?  ? ? ? ? ? ? ? ? ? ? ? ? ? ?  ?  ? ? ? ? ? ? ? ?Anesthesia Physical ?Anesthesia Plan ? ?ASA: 4 ? ?Anesthesia Plan: MAC  ? ?Post-op Pain Management: Tylenol PO (pre-op)* and Minimal or no pain anticipated  ? ?Induction: Intravenous ? ?PONV Risk Score and Plan: 1 and Treatment may vary due to age or medical condition, Propofol infusion, TIVA and Ondansetron ? ?Airway Management Planned: Natural Airway and Simple Face Mask ? ?Additional Equipment: None ? ?Intra-op Plan:  ? ?Post-operative Plan:  ? ?Informed Consent: I have reviewed the patients History and Physical, chart, labs and discussed the procedure including the risks, benefits and alternatives for the proposed anesthesia with the patient or authorized representative who has indicated his/her understanding and  acceptance.  ? ? ? ?Dental advisory given ? ?Plan Discussed with: CRNA, Anesthesiologist and Surgeon ? ?Anesthesia Plan Comments:   ? ? ? ? ? ?Anesthesia Quick Evaluation ? ?

## 2022-04-22 ENCOUNTER — Ambulatory Visit (HOSPITAL_COMMUNITY): Payer: BC Managed Care – PPO | Admitting: Anesthesiology

## 2022-04-22 ENCOUNTER — Encounter (HOSPITAL_COMMUNITY): Payer: Self-pay | Admitting: Internal Medicine

## 2022-04-22 ENCOUNTER — Ambulatory Visit (HOSPITAL_COMMUNITY)
Admission: RE | Admit: 2022-04-22 | Discharge: 2022-04-22 | Disposition: A | Discharge status: Home / Self Care | Payer: BC Managed Care – PPO | Source: Ambulatory Visit | Attending: Internal Medicine | Admitting: Internal Medicine

## 2022-04-22 ENCOUNTER — Encounter (HOSPITAL_COMMUNITY)
Admission: RE | Disposition: A | Discharge status: Home / Self Care | Payer: Self-pay | Source: Ambulatory Visit | Attending: Internal Medicine

## 2022-04-22 ENCOUNTER — Other Ambulatory Visit: Payer: Self-pay

## 2022-04-22 DIAGNOSIS — G4733 Obstructive sleep apnea (adult) (pediatric): Secondary | ICD-10-CM | POA: Diagnosis not present

## 2022-04-22 DIAGNOSIS — L0232 Furuncle of buttock: Secondary | ICD-10-CM | POA: Insufficient documentation

## 2022-04-22 DIAGNOSIS — K649 Unspecified hemorrhoids: Secondary | ICD-10-CM | POA: Diagnosis not present

## 2022-04-22 DIAGNOSIS — Z1211 Encounter for screening for malignant neoplasm of colon: Secondary | ICD-10-CM | POA: Diagnosis not present

## 2022-04-22 DIAGNOSIS — I10 Essential (primary) hypertension: Secondary | ICD-10-CM | POA: Diagnosis not present

## 2022-04-22 DIAGNOSIS — K219 Gastro-esophageal reflux disease without esophagitis: Secondary | ICD-10-CM | POA: Diagnosis not present

## 2022-04-22 DIAGNOSIS — Z7985 Long-term (current) use of injectable non-insulin antidiabetic drugs: Secondary | ICD-10-CM | POA: Diagnosis not present

## 2022-04-22 DIAGNOSIS — E1140 Type 2 diabetes mellitus with diabetic neuropathy, unspecified: Secondary | ICD-10-CM | POA: Diagnosis not present

## 2022-04-22 DIAGNOSIS — Z87891 Personal history of nicotine dependence: Secondary | ICD-10-CM | POA: Diagnosis not present

## 2022-04-22 DIAGNOSIS — G4730 Sleep apnea, unspecified: Secondary | ICD-10-CM | POA: Insufficient documentation

## 2022-04-22 DIAGNOSIS — Z794 Long term (current) use of insulin: Secondary | ICD-10-CM | POA: Diagnosis not present

## 2022-04-22 DIAGNOSIS — K648 Other hemorrhoids: Secondary | ICD-10-CM | POA: Diagnosis not present

## 2022-04-22 HISTORY — PX: COLONOSCOPY WITH PROPOFOL: SHX5780

## 2022-04-22 LAB — GLUCOSE, CAPILLARY
Glucose-Capillary: 65 mg/dL — ABNORMAL LOW (ref 70–99)
Glucose-Capillary: 57 mg/dL — ABNORMAL LOW (ref 70–99)
Glucose-Capillary: 61 mg/dL — ABNORMAL LOW (ref 70–99)
Glucose-Capillary: 97 mg/dL (ref 70–99)

## 2022-04-22 SURGERY — COLONOSCOPY WITH PROPOFOL
Anesthesia: Monitor Anesthesia Care

## 2022-04-22 MED ORDER — SODIUM CHLORIDE 0.9 % IV SOLN: INTRAVENOUS | Status: DC

## 2022-04-22 MED ORDER — GLYCOPYRROLATE 0.2 MG/ML IJ SOLN
INTRAMUSCULAR | Status: DC | PRN
  Administered 2022-04-22: .1 mg via INTRAVENOUS

## 2022-04-22 MED ORDER — LIDOCAINE 2% (20 MG/ML) 5 ML SYRINGE
INTRAMUSCULAR | Status: DC | PRN
  Administered 2022-04-22: 80 mg via INTRAVENOUS

## 2022-04-22 MED ORDER — PROPOFOL 10 MG/ML IV BOLUS
INTRAVENOUS | Status: DC | PRN
Start: 2022-04-22 — End: 2022-04-22
  Administered 2022-04-22: 20 mg via INTRAVENOUS
  Administered 2022-04-22: 60 mg via INTRAVENOUS

## 2022-04-22 MED ORDER — LACTATED RINGERS IV SOLN: INTRAVENOUS | Status: DC

## 2022-04-22 MED ORDER — DEXMEDETOMIDINE (PRECEDEX) IN NS 20 MCG/5ML (4 MCG/ML) IV SYRINGE
PREFILLED_SYRINGE | INTRAVENOUS | Status: DC | PRN
  Administered 2022-04-22: 12 ug via INTRAVENOUS

## 2022-04-22 MED ORDER — PROPOFOL 500 MG/50ML IV EMUL
INTRAVENOUS | Status: DC | PRN
  Administered 2022-04-22: 150 ug/kg/min via INTRAVENOUS

## 2022-04-22 SURGICAL SUPPLY — 22 items

## 2022-04-22 NOTE — H&P (Signed)
?  ? ?GASTROENTEROLOGY PROCEDURE H&P NOTE  ? ?Primary Care Physician: ?Janith Lima, MD ? ? ? ?Reason for Procedure:   Colon cancer screening ? ?Plan:    colonoscopy ? ? ?The nature of the procedure, as well as the risks, benefits, and alternatives were carefully and thoroughly reviewed with the patient. Ample time for discussion and questions allowed. The patient understood, was satisfied, and agreed to proceed.  ? ? ? ?HPI: ?Cedrik J Axell Trigueros. is a 51 y.o. male who presents for colonoscopy.  Medical history as below.  Tolerated the prep.  No recent chest pain or shortness of breath.  No abdominal pain today. ? ?Past Medical History:  ?Diagnosis Date  ? Arthritis   ? GERD (gastroesophageal reflux disease)   ? Hypertension   ? Obesity   ? OSA on CPAP   ? Type 2 diabetes mellitus ([place])   ? followed by pcp  ? Wears partial dentures   ? lower  ? ? ?Past Surgical History:  ?Procedure Laterality Date  ? COLONOSCOPY WITH PROPOFOL  11/06/2012  ? Procedure: COLONOSCOPY WITH PROPOFOL;  Surgeon: Jerene Bears, MD;  Location: [facility];  Service: Gastroenterology;  Laterality: N/A;  Andrea/  ? GASTRIC ROUX-EN-Y N/A 05/02/2018  ? Procedure: LAPAROSCOPIC ROUX-EN-Y GASTRIC BYPASS WITH UPPER ENDOSCOPY;  Surgeon: Excell Seltzer, MD;  Location: [facility];  Service: General;  Laterality: N/A;  ? KNEE ARTHROSCOPY W/ ACL RECONSTRUCTION Right 11-24-2009   dr dean  ? ? ?Prior to Admission medications   ?Medication Sig Start Date End Date Taking? Authorizing Provider  ?acetaminophen (TYLENOL) 650 MG CR tablet Take 1,300 mg by mouth every 8 (eight) hours as needed for pain.   Yes [provider]  ?ciprofloxacin (CIPRO) 500 MG tablet Take 1 tablet (500 mg total) by mouth 2 (two) times daily. 04/15/22  Yes Kendrix Orman, Lajuan Lines, MD  ?hydrocortisone (ANUSOL-HC) 25 MG suppository Place 1 suppository (25 mg total) rectally at bedtime. 04/15/22  Yes Amadeo Coke, Lajuan Lines, MD  ?ibuprofen (ADVIL) 200 MG tablet Take 400-800 mg by mouth every 6 (six)  hours as needed.   Yes [provider]  ?Misc Natural Products (JOINT HEALTH) CAPS Take 2 tablets by mouth daily.   Yes [provider]  ?Multiple Vitamins-Minerals (BARIATRIC MULTIVITAMINS/IRON PO) Take 1 tablet by mouth daily.   Yes [provider]  ?omeprazole (PRILOSEC OTC) 20 MG tablet Take 20 mg by mouth daily as needed (acid refux).   Yes [provider]  ?thiamine (VITAMIN B-1) 100 MG tablet Take 0.5 tablets (50 mg total) by mouth daily. ?Patient taking differently: Take 100 mg by mouth daily. 01/02/22  Yes Janith Lima, MD  ?tirzepatide [facility]) 7.5 MG/0.5ML Pen Inject 7.5 mg into the skin once a week. 04/12/22  Yes Janith Lima, MD  ?blood glucose meter kit and supplies KIT Use to test blood sugar daily. DX E11.8 10/26/18   Janith Lima, MD  ?glucose blood (COOL BLOOD GLUCOSE TEST STRIPS) test strip Use to test blood sugar daily. DX E11.8 07/11/20   Janith Lima, MD  ?Insulin Pen Needle (BD PEN NEEDLE MICRO U/F) 32G X 6 MM MISC Use to inject insulin daily. DX E11.8 06/12/20   Janith Lima, MD  ?Lancets MISC Use to test blood sugar daily. DX E11.8 10/26/18   Janith Lima, MD  ? ? ?Current Facility-Administered Medications  ?Medication Dose Route Frequency Provider Last Rate Last Admin  ? 0.9 %  sodium chloride infusion  Intravenous Continuous Soyla Bainter, Lajuan Lines, MD      ? lactated ringers infusion   Intravenous Continuous Machell Wirthlin, Lajuan Lines, MD 10 mL/hr at 04/22/22 0819 New Bag at 04/22/22 0819  ? ? ?Allergies as of 02/16/2022 - Review Complete 12/30/2021  ?Allergen Reaction Noted  ? Benazepril hcl Cough 02/12/2015  ? Tramadol Other (See Comments) 02/06/2016  ? ? ?Family History  ?Problem Relation Age of Onset  ? Arthritis Mother   ? Hypertension Mother   ? Breast cancer Mother   ? Diabetes Mother   ? Aneurysm Father   ?     aortic  ? Prostate cancer Father   ? Hypertension Father   ? Diabetes Father   ? Hypertension Sister   ? Rheum arthritis Sister   ? Hypertension  Brother   ? Cancer Other   ? ? ?Social History  ? ?Socioeconomic History  ? Marital status: Divorced  ?  Spouse name: Not on file  ? Number of children: 1  ? Years of education: Not on file  ? Highest education level: Not on file  ?Occupational History  ? Occupation: shipping  ?  Employer: [employer]  ?Tobacco Use  ? Smoking status: Former  ?  Types: Cigars  ?  Quit date: 04/24/1989  ?  Years since quitting: 33.0  ? Smokeless tobacco: Never  ? Tobacco comments:  ?  occaional cigars, a couple a year  ?Vaping Use  ? Vaping Use: Never used  ?Substance and Sexual Activity  ? Alcohol use: No  ? Drug use: No  ? Sexual activity: Yes  ?Other Topics Concern  ? Not on file  ?Social History Narrative  ? 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 He lives with girl friend.  He has one daughter. Lives in one story home.   ? Highest level of education: high school  ? Regular exercise-no  ? ?Social Determinants of Health  ? ?Financial Resource Strain: Not on file  ?Food Insecurity: Not on file  ?Transportation Needs: Not on file  ?Physical Activity: Not on file  ?Stress: Not on file  ?Social Connections: Not on file  ?Intimate Partner Violence: Not on file  ? ? ?Physical Exam: ?Vital signs in last 24 hours: ?_0  134/80   Pulse 74   Temp 97.9 ?F (36.6 ?C) (Oral)   Resp 20   Ht _1  (1.803 m)   Wt (!) 147.9 kg   SpO2 97%   BMI 45.47 kg/m?  ?GEN: NAD ?EYE: Sclerae anicteric ?ENT: MMM ?CV: Non-tachycardic ?Pulm: CTA b/l ?GI: Soft, NT/ND ?NEURO:  Alert & Oriented x 3 ? ? ?Zenovia Jarred, MD ?[facility] ? ?04/22/2022 8:54 AM ? ?

## 2022-04-22 NOTE — Transfer of Care (Signed)
Immediate Anesthesia Transfer of Care Note ? ?Patient: Joseph Wood. ? ?Procedure(s) Performed: COLONOSCOPY WITH PROPOFOL ? ?Patient Location: PACU ? ?Anesthesia Type:MAC ? ?Level of Consciousness: awake, alert , oriented and patient cooperative ? ?Airway & Oxygen Therapy: Patient Spontanous Breathing and Patient connected to face mask oxygen ? ?Post-op Assessment: Report given to RN and Post -op Vital signs reviewed and stable ? ?Post vital signs: Reviewed and stable ? ?Last Vitals:  ?Vitals Value Taken Time  ?BP 146/129 04/22/22 0918  ?Temp    ?Pulse 74 04/22/22 0920  ?Resp 11 04/22/22 0920  ?SpO2 100 % 04/22/22 0920  ?Vitals shown include unvalidated device data. ? ?Last Pain:  ?Vitals:  ? 04/22/22 0810  ?TempSrc: Oral  ?PainSc: 0-No pain  ?   ? ?  ? ?Complications: No notable events documented. ?

## 2022-04-22 NOTE — Op Note (Signed)
[facility] ?Patient Name: Joseph Wood ?Procedure Date: 04/22/2022 ?MRN: [id number] ?Attending MD: Jerene Bears , MD ?Date of Birth: [birth year] ?CSN: [id number] ?Age: 51 ?Admit Type: Outpatient ?Procedure:                Colonoscopy ?Indications:              Screening for colorectal malignant neoplasm, Last  ?                          colonoscopy 10 years ago ?Providers:                Lajuan Lines. Keyuana Wank, MD, Allayne Gitelman, RN, Frazier Richards,  ?                          Technician ?Referring MD:             Janith Lima, MD ?Medicines:                Monitored Anesthesia Care ?Complications:            No immediate complications. ?Estimated Blood Loss:     Estimated blood loss: none. ?Procedure:                Pre-Anesthesia Assessment: ?                          - Prior to the procedure, a History and Physical  ?                          was performed, and patient medications and  ?                          allergies were reviewed. The patient's tolerance of  ?                          previous anesthesia was also reviewed. The risks  ?                          and benefits of the procedure and the sedation  ?                          options and risks were discussed with the patient.  ?                          All questions were answered, and informed consent  ?                          was obtained. Prior Anticoagulants: The patient has  ?                          taken no previous anticoagulant or antiplatelet  ?                          agents. ASA Grade Assessment: III - A patient with  ?  severe systemic disease. After reviewing the risks  ?                          and benefits, the patient was deemed in  ?                          satisfactory condition to undergo the procedure. ?                          After obtaining informed consent, the colonoscope  ?                          was passed under direct vision. Throughout the  ?                          procedure,  the patient's blood pressure, pulse, and  ?                          oxygen saturations were monitored continuously. The  ?                          [id number] ([id number]) Olympus colonoscope was  ?                          introduced through the anus and advanced to the  ?                          cecum, identified by appendiceal orifice and  ?                          ileocecal valve. The colonoscopy was performed  ?                          without difficulty. The patient tolerated the  ?                          procedure well. The quality of the bowel  ?                          preparation was excellent. The ileocecal valve,  ?                          appendiceal orifice, and rectum were photographed. ?Scope In: 9:01:06 AM ?Scope Out: 9:13:44 AM ?Scope Withdrawal Time: 0 hours 10 minutes 38 seconds  ?Total Procedure Duration: 0 hours 12 minutes 38 seconds  ?Findings: ?     Small furuncle on left perianal skin without fluctuance or induration  ?     found on perianal exam. ?     The colon (entire examined portion) appeared normal. ?     Internal hemorrhoids were found during retroflexion. The hemorrhoids  ?     were small. ?Impression:               - Resolving perianal furuncle (per patient recently  ?  spontaneous drainage and improvement in pain, also  ?                          treated with oral antibiotics). No fistula or  ?                          perianal abscess seen. ?                          - The entire examined colon is normal. ?                          - Small internal hemorrhoids. ?                          - No specimens collected. ?Moderate Sedation: ?     N/A ?Recommendation:           - Patient has a contact number available for  ?                          emergencies. The signs and symptoms of potential  ?                          delayed complications were discussed with the  ?                          patient. Return to normal activities tomorrow.  ?                           Written discharge instructions were provided to the  ?                          patient. ?                          - Resume previous diet. ?                          - Continue present medications. ?                          - Repeat colonoscopy in 10 years for screening  ?                          purposes. ?Procedure Code(s):        --- Professional --- ?                          X5056, Colorectal cancer screening; colonoscopy on  ?                          individual not meeting criteria for high risk ?Diagnosis Code(s):        --- Professional --- ?                          Z12.11, Encounter for screening for malignant  ?  neoplasm of colon ?                          K64.8, Other hemorrhoids ?                          K64.4, Residual hemorrhoidal skin tags ?CPT copyright 2019 American Medical Association. All rights reserved. ?The codes documented in this report are preliminary and upon coder review may  ?be revised to meet current compliance requirements. ?Jerene Bears, MD ?04/22/2022 9:21:02 AM ?This report has been signed electronically. ?Number of Addenda: 0 ?

## 2022-04-22 NOTE — Discharge Instructions (Signed)
YOU HAD AN ENDOSCOPIC PROCEDURE TODAY: Refer to the procedure report and other information in the discharge instructions given to you for any specific questions about what was found during the examination. If this information does not answer your questions, please call [place] office at [phone] to clarify.  ° °YOU SHOULD EXPECT: Some feelings of bloating in the abdomen. Passage of more gas than usual. Walking can help get rid of the air that was put into your GI tract during the procedure and reduce the bloating. If you had a lower endoscopy (such as a colonoscopy or flexible sigmoidoscopy) you may notice spotting of blood in your stool or on the toilet paper. Some abdominal soreness may be present for a day or two, also. ° °DIET: Your first meal following the procedure should be a light meal and then it is ok to progress to your normal diet. A half-sandwich or bowl of soup is an example of a good first meal. Heavy or fried foods are harder to digest and may make you feel nauseous or bloated. Drink plenty of fluids but you should avoid alcoholic beverages for 24 hours. If you had a esophageal dilation, please see attached instructions for diet.   ° °ACTIVITY: Your care partner should take you home directly after the procedure. You should plan to take it easy, moving slowly for the rest of the day. You can resume normal activity the day after the procedure however YOU SHOULD NOT DRIVE, use power tools, machinery or perform tasks that involve climbing or major physical exertion for 24 hours (because of the sedation medicines used during the test).  ° °SYMPTOMS TO REPORT IMMEDIATELY: °A gastroenterologist can be reached at any hour. Please call [phone]  for any of the following symptoms:  °Following lower endoscopy (colonoscopy, flexible sigmoidoscopy) °Excessive amounts of blood in the stool  °Significant tenderness, worsening of abdominal pains  °Swelling of the abdomen that is new, acute  °Fever of 100° or  higher  °Following upper endoscopy (EGD, EUS, ERCP, esophageal dilation) °Vomiting of blood or coffee ground material  °New, significant abdominal pain  °New, significant chest pain or pain under the shoulder blades  °Painful or persistently difficult swallowing  °New shortness of breath  °Black, tarry-looking or red, bloody stools ° °FOLLOW UP:  °If any biopsies were taken you will be contacted by phone or by letter within the next 1-3 weeks. Call [phone]  if you have not heard about the biopsies in 3 weeks.  °Please also call with any specific questions about appointments or follow up tests. ° °

## 2022-04-22 NOTE — Anesthesia Postprocedure Evaluation (Signed)
Anesthesia Post Note ? ?Patient: Joseph Wood. ? ?Procedure(s) Performed: COLONOSCOPY WITH PROPOFOL ? ?  ? ?Patient location during evaluation: Endoscopy ?Anesthesia Type: MAC ?Level of consciousness: awake and alert ?Pain management: pain level controlled ?Vital Signs Assessment: post-procedure vital signs reviewed and stable ?Respiratory status: spontaneous breathing, nonlabored ventilation and respiratory function stable ?Cardiovascular status: blood pressure returned to baseline and stable ?Postop Assessment: no apparent nausea or vomiting ?Anesthetic complications: no ? ? ?No notable events documented. ? ?Last Vitals:  ?Vitals:  ? 04/22/22 0928 04/22/22 0938  ?BP: (!) 105/56 123/72  ?Pulse: 62 61  ?Resp: 12 13  ?Temp:    ?SpO2: 100% 97%  ?  ?Last Pain:  ?Vitals:  ? 04/22/22 0938  ?TempSrc:   ?PainSc: 0-No pain  ? ? ?  ?  ?  ?  ?  ?  ? ?Merlinda Frederick ? ? ? ? ?

## 2022-05-11 ENCOUNTER — Other Ambulatory Visit: Payer: Self-pay | Admitting: Internal Medicine

## 2022-05-11 ENCOUNTER — Telehealth: Payer: Self-pay | Admitting: Internal Medicine

## 2022-05-11 DIAGNOSIS — E118 Type 2 diabetes mellitus with unspecified complications: Secondary | ICD-10-CM

## 2022-05-11 MED ORDER — TIRZEPATIDE 7.5 MG/0.5ML ~~LOC~~ SOAJ: 7.5000 mg | SUBCUTANEOUS | 0 refills | Status: DC

## 2022-05-11 NOTE — Telephone Encounter (Signed)
Pt called in and states MD sent over tirzepatide [facility]) 7.5 MG/0.5ML Pen incorrectly.   States that it was called in for 6- and there are only 4 pens that come in a box. Pharmacy can't split box.   Requesting two months worth.   Call with questions.    [place], [place] Phone:  [phone]  Fax:  [phone]

## 2022-05-14 DIAGNOSIS — M5459 Other low back pain: Secondary | ICD-10-CM | POA: Diagnosis not present

## 2022-05-17 ENCOUNTER — Telehealth: Payer: Self-pay

## 2022-05-17 ENCOUNTER — Other Ambulatory Visit: Payer: Self-pay | Admitting: Internal Medicine

## 2022-05-17 DIAGNOSIS — K2100 Gastro-esophageal reflux disease with esophagitis, without bleeding: Secondary | ICD-10-CM | POA: Insufficient documentation

## 2022-05-17 MED ORDER — OMEPRAZOLE 40 MG PO CPDR: 40.0000 mg | DELAYED_RELEASE_CAPSULE | Freq: Every day | ORAL | 1 refills | Status: DC

## 2022-05-17 NOTE — Telephone Encounter (Signed)
Pt is wanting a refill on: omeprazole (PRILOSEC OTC) 20 MG tablet  Pt thinks that his insurance will cover with Rx.  Pharmacy: [facility] [address] [place] 04/12/22

## 2022-05-19 DIAGNOSIS — M5459 Other low back pain: Secondary | ICD-10-CM | POA: Diagnosis not present

## 2022-05-20 DIAGNOSIS — M5459 Other low back pain: Secondary | ICD-10-CM | POA: Diagnosis not present

## 2022-05-24 DIAGNOSIS — M5459 Other low back pain: Secondary | ICD-10-CM | POA: Diagnosis not present

## 2022-06-02 ENCOUNTER — Ambulatory Visit: Payer: BC Managed Care – PPO

## 2022-06-02 DIAGNOSIS — G4733 Obstructive sleep apnea (adult) (pediatric): Secondary | ICD-10-CM | POA: Diagnosis not present

## 2022-06-02 DIAGNOSIS — Z8669 Personal history of other diseases of the nervous system and sense organs: Secondary | ICD-10-CM

## 2022-06-03 DIAGNOSIS — M5459 Other low back pain: Secondary | ICD-10-CM | POA: Diagnosis not present

## 2022-06-07 DIAGNOSIS — M5459 Other low back pain: Secondary | ICD-10-CM | POA: Diagnosis not present

## 2022-06-08 DIAGNOSIS — M5459 Other low back pain: Secondary | ICD-10-CM | POA: Diagnosis not present

## 2022-06-14 ENCOUNTER — Telehealth: Payer: Self-pay | Admitting: Pulmonary Disease

## 2022-06-14 DIAGNOSIS — G4733 Obstructive sleep apnea (adult) (pediatric): Secondary | ICD-10-CM | POA: Diagnosis not present

## 2022-06-14 NOTE — Telephone Encounter (Signed)
Call patient  Sleep study result  Date of study: 06/03/2022  Impression: Moderate obstructive sleep apnea Mild oxygen desaturations  Recommendation: DME referral  Recommend CPAP therapy for moderate obstructive sleep apnea  Auto titrating CPAP with pressure settings of 5-15 will be appropriate  Encourage weight loss measures  Follow-up in the office 4 to 6 weeks following initiation of treatment

## 2022-06-14 NOTE — Telephone Encounter (Signed)
I called the patient and he reports that he has a CPAP at home and will start using the machine again. Please advise when he would need a follow up?

## 2022-06-16 DIAGNOSIS — M5459 Other low back pain: Secondary | ICD-10-CM | POA: Diagnosis not present

## 2022-06-17 NOTE — Telephone Encounter (Signed)
ATC but line was disconnected.  That is the only number for pt in the chart. Sent MyChart message for pt to call office to schedule compliance f/u.

## 2022-06-17 NOTE — Telephone Encounter (Signed)
Please call to make a follow up between 07/27/22 and 09/06/2022 for CPAP compliance.

## 2022-06-17 NOTE — Telephone Encounter (Signed)
31-90 days after starting back on CPAP. Please make sure he has an SD card or is able to be enrolled in [place]

## 2022-06-17 NOTE — Telephone Encounter (Signed)
Tried calling to see if he has chip or who the DME is so that he can be added to [place]- no answer- LMTCB.

## 2022-06-25 ENCOUNTER — Telehealth: Payer: Self-pay | Admitting: *Deleted

## 2022-06-25 NOTE — Telephone Encounter (Signed)
Okay to provide. - Dr. Amalia Hailey

## 2022-06-25 NOTE — Telephone Encounter (Signed)
Patient is calling to request an handicap placard. Please advise.

## 2022-06-25 NOTE — Telephone Encounter (Signed)
Patient has been notified and will stop by office to pick up from front desk.

## 2022-07-22 ENCOUNTER — Other Ambulatory Visit: Payer: Self-pay | Admitting: Internal Medicine

## 2022-07-22 ENCOUNTER — Telehealth: Payer: Self-pay | Admitting: Internal Medicine

## 2022-07-22 DIAGNOSIS — E118 Type 2 diabetes mellitus with unspecified complications: Secondary | ICD-10-CM

## 2022-07-22 MED ORDER — TIRZEPATIDE 10 MG/0.5ML ~~LOC~~ SOAJ: 10.0000 mg | SUBCUTANEOUS | 0 refills | Status: DC

## 2022-07-22 NOTE — Telephone Encounter (Signed)
Patient would like to know if the dose of his tirzepatide [facility]) 7.5 MG/0.5ML Pen can be increased. Call back number is [phone].

## 2022-07-23 ENCOUNTER — Other Ambulatory Visit: Payer: Self-pay | Admitting: Internal Medicine

## 2022-08-06 ENCOUNTER — Ambulatory Visit: Payer: BC Managed Care – PPO | Admitting: Adult Health

## 2022-09-21 DIAGNOSIS — Z23 Encounter for immunization: Secondary | ICD-10-CM | POA: Diagnosis not present

## 2022-10-21 ENCOUNTER — Other Ambulatory Visit: Payer: Self-pay | Admitting: Internal Medicine

## 2022-10-21 ENCOUNTER — Telehealth: Payer: Self-pay | Admitting: Internal Medicine

## 2022-10-21 DIAGNOSIS — E118 Type 2 diabetes mellitus with unspecified complications: Secondary | ICD-10-CM

## 2022-10-21 MED ORDER — TIRZEPATIDE 10 MG/0.5ML ~~LOC~~ SOAJ: 10.0000 mg | SUBCUTANEOUS | 0 refills | Status: DC

## 2022-10-21 NOTE — Telephone Encounter (Signed)
Caller & Relationship to patient: PATIENT  Call Back Number: [phone]  Date of Last Office Visit: 04/12/22  Date of Next Office Visit: 12/01/22  Medication(s) to be Refilled: [facility]  Preferred Pharmacy:  [place], [place], [place] [address] Phone: [phone]  Fax: [phone]   Refill until next ppt on 12/01/22.

## 2022-10-25 ENCOUNTER — Telehealth: Payer: Self-pay | Admitting: Internal Medicine

## 2022-10-25 DIAGNOSIS — E118 Type 2 diabetes mellitus with unspecified complications: Secondary | ICD-10-CM

## 2022-10-25 MED ORDER — TIRZEPATIDE 10 MG/0.5ML ~~LOC~~ SOAJ
10.0000 mg | SUBCUTANEOUS | 0 refills | Status: DC
  Filled 2023-02-16: qty 2, 28d supply, fill #0

## 2022-10-25 NOTE — Telephone Encounter (Signed)
Pt does not have enough MOUNJARO to last until appt 12/01/22.  Pt request refill sent to:  [place], [place], [place] [address] Phone: [phone]  Fax: [phone]

## 2022-11-23 ENCOUNTER — Other Ambulatory Visit: Payer: Self-pay | Admitting: Internal Medicine

## 2022-11-23 DIAGNOSIS — K2100 Gastro-esophageal reflux disease with esophagitis, without bleeding: Secondary | ICD-10-CM

## 2022-11-26 ENCOUNTER — Encounter (HOSPITAL_COMMUNITY): Payer: Self-pay | Admitting: *Deleted

## 2022-12-01 ENCOUNTER — Ambulatory Visit (INDEPENDENT_AMBULATORY_CARE_PROVIDER_SITE_OTHER): Payer: BC Managed Care – PPO | Admitting: Internal Medicine

## 2022-12-01 ENCOUNTER — Encounter: Payer: Self-pay | Admitting: Internal Medicine

## 2022-12-01 VITALS — BP 124/82 | HR 69 | Temp 98.1°F | Resp 16 | Ht 71.0 in | Wt 283.2 lb

## 2022-12-01 DIAGNOSIS — G5793 Unspecified mononeuropathy of bilateral lower limbs: Secondary | ICD-10-CM

## 2022-12-01 DIAGNOSIS — E118 Type 2 diabetes mellitus with unspecified complications: Secondary | ICD-10-CM

## 2022-12-01 DIAGNOSIS — I10 Essential (primary) hypertension: Secondary | ICD-10-CM

## 2022-12-01 DIAGNOSIS — Z8042 Family history of malignant neoplasm of prostate: Secondary | ICD-10-CM | POA: Diagnosis not present

## 2022-12-01 DIAGNOSIS — E5111 Dry beriberi: Secondary | ICD-10-CM

## 2022-12-01 DIAGNOSIS — E559 Vitamin D deficiency, unspecified: Secondary | ICD-10-CM | POA: Diagnosis not present

## 2022-12-01 DIAGNOSIS — Z9884 Bariatric surgery status: Secondary | ICD-10-CM | POA: Diagnosis not present

## 2022-12-01 DIAGNOSIS — E785 Hyperlipidemia, unspecified: Secondary | ICD-10-CM | POA: Diagnosis not present

## 2022-12-01 DIAGNOSIS — Z23 Encounter for immunization: Secondary | ICD-10-CM | POA: Diagnosis not present

## 2022-12-01 DIAGNOSIS — K219 Gastro-esophageal reflux disease without esophagitis: Secondary | ICD-10-CM

## 2022-12-01 LAB — LIPID PANEL
Cholesterol: 161 mg/dL (ref 0–200)
HDL: 58.8 mg/dL (ref >39.00)
LDL Cholesterol: 89 mg/dL (ref 0–99)
NonHDL: 102.25
Total CHOL/HDL Ratio: 3
Triglycerides: 66 mg/dL (ref 0.0–149.0)
VLDL: 13.2 mg/dL (ref 0.0–40.0)

## 2022-12-01 LAB — URINALYSIS, ROUTINE W REFLEX MICROSCOPIC
Hgb urine dipstick: NEGATIVE
Ketones, ur: NEGATIVE
Leukocytes,Ua: NEGATIVE
Nitrite: NEGATIVE
Specific Gravity, Urine: >=1.03 — AB (ref 1.000–1.030)
Total Protein, Urine: NEGATIVE
Urine Glucose: NEGATIVE
Urobilinogen, UA: 0.2 (ref 0.0–1.0)
pH: 6 (ref 5.0–8.0)

## 2022-12-01 LAB — CBC WITH DIFFERENTIAL/PLATELET
Basophils Absolute: 0 10*3/uL (ref 0.0–0.1)
Basophils Relative: 0.5 % (ref 0.0–3.0)
Eosinophils Absolute: 0.1 10*3/uL (ref 0.0–0.7)
Eosinophils Relative: 1.9 % (ref 0.0–5.0)
HCT: 45.2 % (ref 39.0–52.0)
Hemoglobin: 15.3 g/dL (ref 13.0–17.0)
Lymphocytes Relative: 25.1 % (ref 12.0–46.0)
Lymphs Abs: 1.4 10*3/uL (ref 0.7–4.0)
MCHC: 33.8 g/dL (ref 30.0–36.0)
MCV: 92.4 fl (ref 78.0–100.0)
Monocytes Absolute: 0.5 10*3/uL (ref 0.1–1.0)
Monocytes Relative: 8.5 % (ref 3.0–12.0)
Neutro Abs: 3.6 10*3/uL (ref 1.4–7.7)
Neutrophils Relative %: 64 % (ref 43.0–77.0)
Platelets: 249 10*3/uL (ref 150.0–400.0)
RBC: 4.89 Mil/uL (ref 4.22–5.81)
RDW: 14.1 % (ref 11.5–15.5)
WBC: 5.7 10*3/uL (ref 4.0–10.5)

## 2022-12-01 LAB — FOLATE: Folate: 21 ng/mL (ref >5.9)

## 2022-12-01 LAB — HEPATIC FUNCTION PANEL
ALT: 15 U/L (ref 0–53)
AST: 18 U/L (ref 0–37)
Albumin: 4.3 g/dL (ref 3.5–5.2)
Alkaline Phosphatase: 67 U/L (ref 39–117)
Bilirubin, Direct: 0.1 mg/dL (ref 0.0–0.3)
Total Bilirubin: 0.7 mg/dL (ref 0.2–1.2)
Total Protein: 6.9 g/dL (ref 6.0–8.3)

## 2022-12-01 LAB — HEMOGLOBIN A1C: Hgb A1c MFr Bld: 5.3 % (ref 4.6–6.5)

## 2022-12-01 LAB — MICROALBUMIN / CREATININE URINE RATIO
Creatinine,U: 201.3 mg/dL
Microalb Creat Ratio: 0.4 mg/g (ref 0.0–30.0)
Microalb, Ur: 0.7 mg/dL (ref 0.0–1.9)

## 2022-12-01 LAB — VITAMIN D 25 HYDROXY (VIT D DEFICIENCY, FRACTURES): VITD: 24.62 ng/mL — ABNORMAL LOW (ref 30.00–100.00)

## 2022-12-01 LAB — TSH: TSH: 2.15 u[IU]/mL (ref 0.35–5.50)

## 2022-12-01 LAB — PSA: PSA: 0.91 ng/mL (ref 0.10–4.00)

## 2022-12-01 LAB — VITAMIN B12: Vitamin B-12: 302 pg/mL (ref 211–911)

## 2022-12-01 MED ORDER — CHOLECALCIFEROL 125 MCG (5000 UT) PO CAPS: 5000.0000 [IU] | ORAL_CAPSULE | Freq: Every day | ORAL | 1 refills | Status: DC

## 2022-12-01 MED ORDER — VITAMIN B-1 100 MG PO TABS: 100.0000 mg | ORAL_TABLET | Freq: Every day | ORAL | 1 refills | Status: DC

## 2022-12-01 NOTE — Patient Instructions (Signed)

## 2022-12-01 NOTE — Progress Notes (Signed)
Subjective:  Patient ID: Joseph Files., male    DOB: [birth year]  Age: 51 y.o. MRN: [id number]  CC: Gastroesophageal Reflux, Diabetes, and Hyperlipidemia   HPI Joseph J [employer]. presents for f/up -  He is active and denies chest pain, shortness of breath, diaphoresis, or edema.  His neuropathy symptoms are unchanged.  Outpatient Medications Prior to Visit  Medication Sig Dispense Refill   acetaminophen (TYLENOL) 650 MG CR tablet Take 1,300 mg by mouth every 8 (eight) hours as needed for pain.     blood glucose meter kit and supplies KIT Use to test blood sugar daily. DX E11.8 1 each 0   glucose blood (COOL BLOOD GLUCOSE TEST STRIPS) test strip Use to test blood sugar daily. DX E11.8 100 each 3   Insulin Pen Needle (BD PEN NEEDLE MICRO U/F) 32G X 6 MM MISC Use to inject insulin daily. DX E11.8 100 each 3   Lancets MISC Use to test blood sugar daily. DX E11.8 100 each 3   Misc Natural Products (JOINT HEALTH) CAPS Take 2 tablets by mouth daily.     omeprazole (PRILOSEC) 40 MG capsule Take 1 capsule by mouth once daily 90 capsule 0   tirzepatide (MOUNJARO) 10 MG/0.5ML Pen Inject 10 mg into the skin once a week. 6 mL 0   thiamine (VITAMIN B-1) 100 MG tablet Take 0.5 tablets (50 mg total) by mouth daily. (Patient taking differently: Take 100 mg by mouth daily.) 45 tablet 1   Multiple Vitamins-Minerals (BARIATRIC MULTIVITAMINS/IRON PO) Take 1 tablet by mouth daily.     hydrocortisone (ANUSOL-HC) 25 MG suppository Place 1 suppository (25 mg total) rectally at bedtime. (Patient not taking: Reported on 12/01/2022) 7 suppository 0   No facility-administered medications prior to visit.    ROS Review of Systems  Constitutional: Negative.  Negative for diaphoresis and fatigue.  HENT: Negative.    Eyes: Negative.   Respiratory: Negative.  Negative for cough, chest tightness, shortness of breath and wheezing.   Cardiovascular:  Negative for chest pain, palpitations and leg swelling.   Gastrointestinal:  Negative for abdominal pain, constipation, diarrhea, nausea and vomiting.  Endocrine: Negative.   Genitourinary: Negative.  Negative for difficulty urinating.  Musculoskeletal: Negative.   Skin: Negative.   Neurological:  Negative for dizziness and weakness.  Hematological:  Negative for adenopathy. Does not bruise/bleed easily.  Psychiatric/Behavioral: Negative.      Objective:  BP 124/82 (BP Location: Left Arm, Patient Position: Sitting, Cuff Size: Large)   Pulse 69   Temp 98.1 F (36.7 C) (Oral)   Resp 16   Ht _0  (1.803 m)   Wt 283 lb 3.2 oz (128.5 kg)   SpO2 98%   BMI 39.50 kg/m   BP Readings from Last 3 Encounters:  12/01/22 124/82  04/22/22 129/70  04/12/22 136/84    Wt Readings from Last 3 Encounters:  12/01/22 283 lb 3.2 oz (128.5 kg)  04/22/22 (!) 326 lb (147.9 kg)  04/12/22 (!) 326 lb (147.9 kg)    Physical Exam Vitals reviewed.  Constitutional:      Appearance: He is not ill-appearing.  HENT:     Mouth/Throat:     Mouth: Mucous membranes are moist.  Eyes:     General: No scleral icterus.    Conjunctiva/sclera: Conjunctivae normal.  Cardiovascular:     Rate and Rhythm: Normal rate and regular rhythm.     Heart sounds: No murmur heard. Pulmonary:     Effort: Pulmonary effort is  normal.     Breath sounds: No stridor. No wheezing, rhonchi or rales.  Abdominal:     General: Abdomen is flat.     Palpations: There is no mass.     Tenderness: There is no abdominal tenderness. There is no guarding.     Hernia: No hernia is present.  Musculoskeletal:        General: Normal range of motion.     Cervical back: Neck supple.     Right lower leg: No edema.     Left lower leg: No edema.  Lymphadenopathy:     Cervical: No cervical adenopathy.  Skin:    General: Skin is warm and dry.     Findings: No rash.  Neurological:     General: No focal deficit present.     Mental Status: He is alert. Mental status is at baseline.   Psychiatric:        Mood and Affect: Mood normal.        Behavior: Behavior normal.     Lab Results  Component Value Date   WBC 5.7 12/01/2022   HGB 15.3 12/01/2022   HCT 45.2 12/01/2022   PLT 249.0 12/01/2022   GLUCOSE 87 12/30/2021   CHOL 161 12/01/2022   TRIG 66.0 12/01/2022   HDL 58.80 12/01/2022   LDLCALC 89 12/01/2022   ALT 15 12/01/2022   AST 18 12/01/2022   NA 143 12/30/2021   K 4.4 12/30/2021   CL 105 12/30/2021   CREATININE 0.93 12/30/2021   BUN 16 12/30/2021   CO2 31 12/30/2021   TSH 2.15 12/01/2022   PSA 0.91 12/01/2022   HGBA1C 5.3 12/01/2022   MICROALBUR 0.7 12/01/2022    No results found.  Assessment & Plan:   Joseph Wood was seen today for gastroesophageal reflux, diabetes and hyperlipidemia.  Diagnoses and all orders for this visit:  HYPERTENSION, BENIGN ESSENTIAL - His BP is well controlled. -     CBC with Differential/Platelet; Future -     Hepatic function panel; Future -     Urinalysis, Routine w reflex microscopic; Future -     TSH; Future -     CBC with Differential/Platelet -     Hepatic function panel -     Urinalysis, Routine w reflex microscopic -     TSH  Type II diabetes mellitus with manifestations ([place])- His blood sugar is well controlled. -     Hemoglobin A1c; Future -     Ambulatory referral to Ophthalmology -     Microalbumin / creatinine urine ratio; Future -     HM Diabetes Foot Exam -     Hemoglobin A1c -     Microalbumin / creatinine urine ratio  Thiamine deficiency neuropathy -     CBC with Differential/Platelet; Future -     thiamine (VITAMIN B-1) 100 MG tablet; Take 1 tablet (100 mg total) by mouth daily. -     CBC with Differential/Platelet  Hyperlipidemia with target LDL less than 100- LDL goal achieved. Doing well on the statin  -     Lipid panel; Future -     Hepatic function panel; Future -     TSH; Future -     Lipid panel -     Hepatic function panel -     TSH  Gastroesophageal reflux disease without  esophagitis  Neuropathy involving both lower extremities -     Zinc; Future -     Folate; Future -  Vitamin B12; Future -     Zinc -     Folate -     Vitamin B12  History of gastric bypass- Vit levels are normal. -     VITAMIN D 25 Hydroxy (Vit-D Deficiency, Fractures); Future -     Zinc; Future -     Folate; Future -     Vitamin B12; Future -     VITAMIN D 25 Hydroxy (Vit-D Deficiency, Fractures) -     Zinc -     Folate -     Vitamin B12  Vitamin D deficiency disease -     VITAMIN D 25 Hydroxy (Vit-D Deficiency, Fractures); Future -     VITAMIN D 25 Hydroxy (Vit-D Deficiency, Fractures) -     Cholecalciferol 125 MCG (5000 UT) capsule; Take 1 capsule (5,000 Units total) by mouth daily.  Family history of prostate cancer in father -     PSA; Future -     PSA  Other orders -     Zoster Recombinant (Shingrix )   I have discontinued Joseph J. Cabal Jr.'s hydrocortisone. I have also changed his thiamine. Additionally, I am having him start on Cholecalciferol. Lastly, I am having him maintain his Multiple Vitamins-Minerals (BARIATRIC MULTIVITAMINS/IRON PO), blood glucose meter kit and supplies, Lancets, BD Pen Needle Micro U/F, Cool Blood Glucose Test Strips, acetaminophen, Joint Health, tirzepatide, and omeprazole.  Meds ordered this encounter  Medications   thiamine (VITAMIN B-1) 100 MG tablet    Sig: Take 1 tablet (100 mg total) by mouth daily.    Dispense:  90 tablet    Refill:  1   Cholecalciferol 125 MCG (5000 UT) capsule    Sig: Take 1 capsule (5,000 Units total) by mouth daily.    Dispense:  90 capsule    Refill:  1     Follow-up: Return in about 6 months (around 06/02/2023).  Scarlette Calico, MD

## 2022-12-03 LAB — ZINC: Zinc: 63 ug/dL (ref 60–130)

## 2022-12-05 ENCOUNTER — Encounter: Payer: Self-pay | Admitting: Internal Medicine

## 2022-12-07 ENCOUNTER — Other Ambulatory Visit: Payer: Self-pay | Admitting: Internal Medicine

## 2022-12-09 ENCOUNTER — Telehealth: Payer: Self-pay

## 2022-12-09 NOTE — Telephone Encounter (Signed)
Key: [id number]  Approved Coverage Start Date:11/09/2022;Coverage End Date:12/09/2023;

## 2022-12-13 ENCOUNTER — Telehealth: Payer: BC Managed Care – PPO | Admitting: Nurse Practitioner

## 2022-12-13 DIAGNOSIS — K0889 Other specified disorders of teeth and supporting structures: Secondary | ICD-10-CM | POA: Diagnosis not present

## 2022-12-13 DIAGNOSIS — K047 Periapical abscess without sinus: Secondary | ICD-10-CM

## 2022-12-13 MED ORDER — AMOXICILLIN-POT CLAVULANATE 875-125 MG PO TABS: 1.0000 | ORAL_TABLET | Freq: Two times a day (BID) | ORAL | 0 refills | Status: AC

## 2022-12-13 NOTE — Progress Notes (Signed)
E-Visit for Dental Pain  We are sorry that you are not feeling well.  Here is how we plan to help!  Based on what you have shared with me in the questionnaire, it sounds like you have an infection in a tooth  Augmentin 875-'125mg'$  twice a day for 7 days  It is imperative that you see a dentist within 10 days of this eVisit to determine the cause of the dental pain and be sure it is adequately treated  A toothache or tooth pain is caused when the nerve in the root of a tooth or surrounding a tooth is irritated. Dental (tooth) infection, decay, injury, or loss of a tooth are the most common causes of dental pain. Pain may also occur after an extraction (tooth is pulled out). Pain sometimes originates from other areas and radiates to the jaw, thus appearing to be tooth pain.Bacteria growing inside your mouth can contribute to gum disease and dental decay, both of which can cause pain. A toothache occurs from inflammation of the central portion of the tooth called pulp. The pulp contains nerve endings that are very sensitive to pain. Inflammation to the pulp or pulpitis may be caused by dental cavities, trauma, and infection.    HOME CARE:   For toothaches: Over-the-counter pain medications such as acetaminophen or ibuprofen may be used. Take these as directed on the package while you arrange for a dental appointment. Avoid very cold or hot foods, because they may make the pain worse. You may get relief from biting on a cotton ball soaked in oil of cloves. You can get oil of cloves at most drug stores.  For jaw pain:  Aspirin may be helpful for problems in the joint of the jaw in adults. If pain happens every time you open your mouth widely, the temporomandibular joint (TMJ) may be the source of the pain. Yawning or taking a large bite of food may worsen the pain. An appointment with your doctor or dentist will help you find the cause.     GET HELP RIGHT AWAY IF:  You have a high fever or  chills If you have had a recent head or face injury and develop headache, light headedness, nausea, vomiting, or other symptoms that concern you after an injury to your face or mouth, you could have a more serious injury in addition to your dental injury. A facial rash associated with a toothache: This condition may improve with medication. Contact your doctor for them to decide what is appropriate. Any jaw pain occurring with chest pain: Although jaw pain is most commonly caused by dental disease, it is sometimes referred pain from other areas. People with heart disease, especially people who have had stents placed, people with diabetes, or those who have had heart surgery may have jaw pain as a symptom of heart attack or angina. If your jaw or tooth pain is associated with lightheadedness, sweating, or shortness of breath, you should see a doctor as soon as possible. Trouble swallowing or excessive pain or bleeding from gums: If you have a history of a weakened immune system, diabetes, or steroid use, you may be more susceptible to infections. Infections can often be more severe and extensive or caused by unusual organisms. Dental and gum infections in people with these conditions may require more aggressive treatment. An abscess may need draining or IV antibiotics, for example.  MAKE SURE YOU   Understand these instructions. Will watch your condition. Will get help right away if  you are not doing well or get worse.  Thank you for choosing an e-visit.  Your e-visit answers were reviewed by a board certified advanced clinical practitioner to complete your personal care plan. Depending upon the condition, your plan could have included both over the counter or prescription medications.  Please review your pharmacy choice. Make sure the pharmacy is open so you can pick up prescription now. If there is a problem, you may contact your provider through [employer] and have the prescription routed to  another pharmacy.  Your safety is important to [place]. If you have drug allergies check your prescription carefully.   For the next 24 hours you can use MyChart to ask questions about today's visit, request a non-urgent call back, or ask for a work or school excuse. You will get an email in the next two days asking about your experience. I hope that your e-visit has been valuable and will speed your recovery.   Meds ordered this encounter  Medications   amoxicillin-clavulanate (AUGMENTIN) 875-125 MG tablet    Sig: Take 1 tablet by mouth 2 (two) times daily for 7 days. Take with food    Dispense:  14 tablet    Refill:  0    I spent approximately 5 minutes reviewing the patient's history, current symptoms and coordinating their care today.

## 2022-12-21 ENCOUNTER — Telehealth: Payer: Self-pay | Admitting: Internal Medicine

## 2022-12-21 NOTE — Telephone Encounter (Signed)
Pt picked up a monjaro refill today, but was confused because he thought Dr. Ronnald Ramp was going to up the dosage based on the last OV on 12.20.23.  Please clarify if the dosage is supposed to increase or stay at '10mg'$ .

## 2022-12-21 NOTE — Telephone Encounter (Signed)
Please advise as last OV note mentioned to maintain Monjaro. I did not see note of dose change.

## 2022-12-22 NOTE — Telephone Encounter (Signed)
Pt has been informed to continue on current dose due to good A1c. Pt expressed understanding.

## 2023-01-20 ENCOUNTER — Telehealth: Payer: Self-pay

## 2023-01-20 NOTE — Telephone Encounter (Signed)
That's fine. - Dr. Amalia Hailey

## 2023-01-25 ENCOUNTER — Telehealth: Payer: Self-pay | Admitting: *Deleted

## 2023-01-25 NOTE — Telephone Encounter (Signed)
Handicap placard has been approved , completed and patient has been updated for pick up.

## 2023-02-11 ENCOUNTER — Telehealth: Payer: Self-pay | Admitting: Internal Medicine

## 2023-02-11 MED ORDER — COOL BLOOD GLUCOSE TEST STRIPS VI STRP: ORAL_STRIP | 3 refills | Status: AC

## 2023-02-11 NOTE — Telephone Encounter (Signed)
MEDICATION:glucose blood (COOL BLOOD GLUCOSE TEST STRIPS) test strip   PHARMACY:[facility] on [address]  Comments:   **Let patient know to contact pharmacy at the end of the day to make sure medication is ready. **  ** Please notify patient to allow 48-72 hours to process**  **Encourage patient to contact the pharmacy for refills or they can request refills through [facility]**

## 2023-02-15 ENCOUNTER — Other Ambulatory Visit: Payer: Self-pay

## 2023-02-16 ENCOUNTER — Other Ambulatory Visit (HOSPITAL_COMMUNITY): Payer: Self-pay

## 2023-03-23 ENCOUNTER — Telehealth: Payer: Self-pay | Admitting: Internal Medicine

## 2023-03-23 NOTE — Telephone Encounter (Signed)
Pt wanted to know if he can get a substitute for  tirzepatide [facility]) 10 MG/0.5ML Pen

## 2023-03-29 ENCOUNTER — Ambulatory Visit (INDEPENDENT_AMBULATORY_CARE_PROVIDER_SITE_OTHER): Payer: BC Managed Care – PPO | Admitting: Internal Medicine

## 2023-03-29 ENCOUNTER — Encounter: Payer: Self-pay | Admitting: Internal Medicine

## 2023-03-29 VITALS — BP 128/80 | HR 65 | Temp 98.4°F | Ht 71.0 in | Wt 276.0 lb

## 2023-03-29 DIAGNOSIS — B354 Tinea corporis: Secondary | ICD-10-CM

## 2023-03-29 DIAGNOSIS — I10 Essential (primary) hypertension: Secondary | ICD-10-CM | POA: Diagnosis not present

## 2023-03-29 DIAGNOSIS — E785 Hyperlipidemia, unspecified: Secondary | ICD-10-CM | POA: Diagnosis not present

## 2023-03-29 DIAGNOSIS — Z0001 Encounter for general adult medical examination with abnormal findings: Secondary | ICD-10-CM

## 2023-03-29 DIAGNOSIS — E118 Type 2 diabetes mellitus with unspecified complications: Secondary | ICD-10-CM | POA: Diagnosis not present

## 2023-03-29 DIAGNOSIS — Z0000 Encounter for general adult medical examination without abnormal findings: Secondary | ICD-10-CM | POA: Diagnosis not present

## 2023-03-29 DIAGNOSIS — L02213 Cutaneous abscess of chest wall: Secondary | ICD-10-CM | POA: Insufficient documentation

## 2023-03-29 LAB — BASIC METABOLIC PANEL
BUN: 18 mg/dL (ref 6–23)
CO2: 29 mEq/L (ref 19–32)
Calcium: 9.3 mg/dL (ref 8.4–10.5)
Chloride: 107 mEq/L (ref 96–112)
Creatinine, Ser: 0.9 mg/dL (ref 0.40–1.50)
GFR: 98.7 mL/min (ref >60.00)
Glucose, Bld: 77 mg/dL (ref 70–99)
Potassium: 4.3 mEq/L (ref 3.5–5.1)
Sodium: 144 mEq/L (ref 135–145)

## 2023-03-29 LAB — HEMOGLOBIN A1C: Hgb A1c MFr Bld: 5.3 % (ref 4.6–6.5)

## 2023-03-29 MED ORDER — TIRZEPATIDE 7.5 MG/0.5ML ~~LOC~~ SOAJ
7.5000 mg | SUBCUTANEOUS | 0 refills | Status: DC
Start: 2023-03-29 — End: 2023-03-30

## 2023-03-29 MED ORDER — SULFAMETHOXAZOLE-TRIMETHOPRIM 800-160 MG PO TABS
1.0000 | ORAL_TABLET | Freq: Two times a day (BID) | ORAL | 0 refills | Status: AC
Start: 2023-03-29 — End: 2023-04-05

## 2023-03-29 MED ORDER — FLUCONAZOLE 100 MG PO TABS
100.0000 mg | ORAL_TABLET | Freq: Every day | ORAL | 0 refills | Status: AC
Start: 2023-03-29 — End: 2023-04-05

## 2023-03-29 NOTE — Patient Instructions (Signed)
Health Maintenance, Male Adopting a healthy lifestyle and getting preventive care are important in promoting health and wellness. Ask your health care provider about: The right schedule for you to have regular tests and exams. Things you can do on your own to prevent diseases and keep yourself healthy. What should I know about diet, weight, and exercise? Eat a healthy diet  Eat a diet that includes plenty of vegetables, fruits, low-fat dairy products, and lean protein. Do not eat a lot of foods that are high in solid fats, added sugars, or sodium. Maintain a healthy weight Body mass index (BMI) is a measurement that can be used to identify possible weight problems. It estimates body fat based on height and weight. Your health care provider can help determine your BMI and help you achieve or maintain a healthy weight. Get regular exercise Get regular exercise. This is one of the most important things you can do for your health. Most adults should: Exercise for at least 150 minutes each week. The exercise should increase your heart rate and make you sweat (moderate-intensity exercise). Do strengthening exercises at least twice a week. This is in addition to the moderate-intensity exercise. Spend less time sitting. Even light physical activity can be beneficial. Watch cholesterol and blood lipids Have your blood tested for lipids and cholesterol at 52 years of age, then have this test every 5 years. You may need to have your cholesterol levels checked more often if: Your lipid or cholesterol levels are high. You are older than 52 years of age. You are at high risk for heart disease. What should I know about cancer screening? Many types of cancers can be detected early and may often be prevented. Depending on your health history and family history, you may need to have cancer screening at various ages. This may include screening for: Colorectal cancer. Prostate cancer. Skin cancer. Lung  cancer. What should I know about heart disease, diabetes, and high blood pressure? Blood pressure and heart disease High blood pressure causes heart disease and increases the risk of stroke. This is more likely to develop in people who have high blood pressure readings or are overweight. Talk with your health care provider about your target blood pressure readings. Have your blood pressure checked: Every 3-5 years if you are 18-39 years of age. Every year if you are 40 years old or older. If you are between the ages of 65 and 75 and are a current or former smoker, ask your health care provider if you should have a one-time screening for abdominal aortic aneurysm (AAA). Diabetes Have regular diabetes screenings. This checks your fasting blood sugar level. Have the screening done: Once every three years after age 45 if you are at a normal weight and have a low risk for diabetes. More often and at a younger age if you are overweight or have a high risk for diabetes. What should I know about preventing infection? Hepatitis B If you have a higher risk for hepatitis B, you should be screened for this virus. Talk with your health care provider to find out if you are at risk for hepatitis B infection. Hepatitis C Blood testing is recommended for: Everyone born from 1945 through 1965. Anyone with known risk factors for hepatitis C. Sexually transmitted infections (STIs) You should be screened each year for STIs, including gonorrhea and chlamydia, if: You are sexually active and are younger than 52 years of age. You are older than 52 years of age and your   health care provider tells you that you are at risk for this type of infection. Your sexual activity has changed since you were last screened, and you are at increased risk for chlamydia or gonorrhea. Ask your health care provider if you are at risk. Ask your health care provider about whether you are at high risk for HIV. Your health care provider  may recommend a prescription medicine to help prevent HIV infection. If you choose to take medicine to prevent HIV, you should first get tested for HIV. You should then be tested every 3 months for as long as you are taking the medicine. Follow these instructions at home: Alcohol use Do not drink alcohol if your health care provider tells you not to drink. If you drink alcohol: Limit how much you have to 0-2 drinks a day. Know how much alcohol is in your drink. In the [place] one drink equals one 12 oz bottle of beer (355 mL), one 5 oz glass of wine (148 mL), or one 1 oz glass of hard liquor (44 mL). Lifestyle Do not use any products that contain nicotine or tobacco. These products include cigarettes, chewing tobacco, and vaping devices, such as e-cigarettes. If you need help quitting, ask your health care provider. Do not use street drugs. Do not share needles. Ask your health care provider for help if you need support or information about quitting drugs. General instructions Schedule regular health, dental, and eye exams. Stay current with your vaccines. Tell your health care provider if: You often feel depressed. You have ever been abused or do not feel safe at home. Summary Adopting a healthy lifestyle and getting preventive care are important in promoting health and wellness. Follow your health care provider's instructions about healthy diet, exercising, and getting tested or screened for diseases. Follow your health care provider's instructions on monitoring your cholesterol and blood pressure. This information is not intended to replace advice given to you by your health care provider. Make sure you discuss any questions you have with your health care provider. Document Revised: 04/20/2021 Document Reviewed: 04/20/2021 Elsevier Patient Education  2023 [employer]  

## 2023-03-29 NOTE — Progress Notes (Signed)
Subjective:  Patient ID: Joseph Wood., male    DOB: 07-May-1971  Age: 52 y.o. MRN: [id number]  CC: Annual Exam, Hypertension, and Diabetes   HPI Joseph Wood. presents for a CPX and f/up -  He complains of a 3 day history of painful, red, swollen area in his right flank.  It burst a few days ago and drained pus.  He denies fever, chills, night sweats, chest pain, or shortness of breath.  Outpatient Medications Prior to Visit  Medication Sig Dispense Refill   acetaminophen (TYLENOL) 650 MG CR tablet Take 1,300 mg by mouth every 8 (eight) hours as needed for pain.     blood glucose meter kit and supplies KIT Use to test blood sugar daily. DX E11.8 1 each 0   Cholecalciferol 125 MCG (5000 UT) capsule Take 1 capsule (5,000 Units total) by mouth daily. 90 capsule 1   glucose blood (COOL BLOOD GLUCOSE TEST STRIPS) test strip Use to test blood sugar daily. DX E11.8 100 each 3   Insulin Pen Needle (BD PEN NEEDLE MICRO U/F) 32G X 6 MM MISC Use to inject insulin daily. DX E11.8 100 each 3   Lancets MISC Use to test blood sugar daily. DX E11.8 100 each 3   Misc Natural Products (JOINT HEALTH) CAPS Take 2 tablets by mouth daily.     Multiple Vitamins-Minerals (BARIATRIC MULTIVITAMINS/IRON PO) Take 1 tablet by mouth daily.     omeprazole (PRILOSEC) 40 MG capsule Take 1 capsule by mouth once daily 90 capsule 0   thiamine (VITAMIN B-1) 100 MG tablet Take 1 tablet (100 mg total) by mouth daily. 90 tablet 1   tirzepatide (MOUNJARO) 10 MG/0.5ML Pen Inject 10 mg into the skin once a week. 2 mL 0   No facility-administered medications prior to visit.    ROS Review of Systems  Constitutional: Negative.  Negative for diaphoresis and fatigue.  HENT: Negative.    Eyes: Negative.  Negative for visual disturbance.  Respiratory:  Negative for cough, chest tightness, shortness of breath and wheezing.   Cardiovascular:  Negative for chest pain, palpitations and leg swelling.   Gastrointestinal:  Negative for abdominal pain, constipation, diarrhea, nausea and vomiting.  Genitourinary: Negative.  Negative for difficulty urinating.  Musculoskeletal: Negative.  Negative for arthralgias and myalgias.  Skin:  Positive for color change, rash and wound. Negative for pallor.  Neurological:  Negative for dizziness, weakness and light-headedness.  Hematological:  Negative for adenopathy. Does not bruise/bleed easily.  Psychiatric/Behavioral: Negative.      Objective:  BP 128/80 (BP Location: Left Arm, Patient Position: Sitting, Cuff Size: Normal)   Pulse 65   Temp 98.4 F (36.9 C) (Oral)   Ht  (1.803 m)   Wt 276 lb (125.2 kg)   SpO2 99%   BMI 38.49 kg/m   BP Readings from Last 3 Encounters:  03/29/23 128/80  12/01/22 124/82  04/22/22 129/70    Wt Readings from Last 3 Encounters:  03/29/23 276 lb (125.2 kg)  12/01/22 283 lb 3.2 oz (128.5 kg)  04/22/22 (!) 326 lb (147.9 kg)    Physical Exam Vitals reviewed.  HENT:     Mouth/Throat:     Mouth: Mucous membranes are moist.  Eyes:     General: No scleral icterus.    Conjunctiva/sclera: Conjunctivae normal.  Cardiovascular:     Rate and Rhythm: Normal rate and regular rhythm.     Heart sounds: No murmur heard. Pulmonary:  Effort: Pulmonary effort is normal.     Breath sounds: No stridor. No wheezing, rhonchi or rales.  Abdominal:     General: Abdomen is protuberant. Bowel sounds are normal. There is no distension.     Palpations: Abdomen is soft. There is no hepatomegaly, splenomegaly or mass.    Musculoskeletal:        General: Normal range of motion.     Cervical back: Neck supple.     Right lower leg: No edema.     Left lower leg: No edema.  Lymphadenopathy:     Cervical: No cervical adenopathy.  Skin:    Findings: Erythema, lesion and rash present.  Neurological:     General: No focal deficit present.     Mental Status: He is alert.     Lab Results  Component Value Date    WBC 5.7 12/01/2022   HGB 15.3 12/01/2022   HCT 45.2 12/01/2022   PLT 249.0 12/01/2022   GLUCOSE 77 03/29/2023   CHOL 161 12/01/2022   TRIG 66.0 12/01/2022   HDL 58.80 12/01/2022   LDLCALC 89 12/01/2022   ALT 15 12/01/2022   AST 18 12/01/2022   NA 144 03/29/2023   K 4.3 03/29/2023   CL 107 03/29/2023   CREATININE 0.90 03/29/2023   BUN 18 03/29/2023   CO2 29 03/29/2023   TSH 2.15 12/01/2022   PSA 0.91 12/01/2022   HGBA1C 5.3 03/29/2023   MICROALBUR 0.7 12/01/2022    No results found.  Assessment & Plan:   HYPERTENSION, BENIGN ESSENTIAL- His blood pressure is well-controlled. -     Basic metabolic panel; Future  Type II diabetes mellitus with manifestations- His blood sugar is well-controlled. -     Basic metabolic panel; Future -     Hemoglobin A1c; Future -     Ambulatory referral to Ophthalmology -     Tirzepatide; Inject 10 mg into the skin once a week.  Dispense: 6 mL; Refill: 1  Hyperlipidemia with target LDL less than 100- His ASCVD risk score is 4.6%.  Tinea corporis -     Fluconazole; Take 1 tablet (100 mg total) by mouth daily for 7 days.  Dispense: 7 tablet; Refill: 0  Abscess of chest wall- Culture is positive for Proteus (sensitive to Bactrim). -     WOUND CULTURE; Future -     Sulfamethoxazole-Trimethoprim; Take 1 tablet by mouth 2 (two) times daily for 7 days.  Dispense: 14 tablet; Refill: 0 -     Fluconazole; Take 1 tablet (100 mg total) by mouth daily for 7 days.  Dispense: 7 tablet; Refill: 0  Encounter for general adult medical examination with abnormal findings- Exam completed, labs reviewed, vaccines reviewed, cancer screenings are up-to-date, patient education was given.     Follow-up: Return in about 6 months (around 09/28/2023).  Sanda Linger, MD

## 2023-03-30 ENCOUNTER — Other Ambulatory Visit (HOSPITAL_COMMUNITY): Payer: Self-pay

## 2023-03-30 ENCOUNTER — Encounter: Payer: Self-pay | Admitting: Internal Medicine

## 2023-03-30 MED ORDER — TIRZEPATIDE 10 MG/0.5ML ~~LOC~~ SOAJ
10.0000 mg | SUBCUTANEOUS | 1 refills | Status: DC
Start: 2023-03-30 — End: 2023-11-03

## 2023-03-30 NOTE — Telephone Encounter (Signed)
Pt has called and stated that he was able to find the [facility]  at the [facility] outpt pharmacy.  **Pt has stated can PCP please send the  over to that pharmacy.

## 2023-03-31 ENCOUNTER — Other Ambulatory Visit: Payer: Self-pay | Admitting: Internal Medicine

## 2023-03-31 ENCOUNTER — Other Ambulatory Visit: Payer: Self-pay

## 2023-03-31 ENCOUNTER — Other Ambulatory Visit (HOSPITAL_COMMUNITY): Payer: Self-pay

## 2023-03-31 DIAGNOSIS — E118 Type 2 diabetes mellitus with unspecified complications: Secondary | ICD-10-CM

## 2023-03-31 MED ORDER — MOUNJARO 10 MG/0.5ML ~~LOC~~ SOAJ
10.0000 mg | SUBCUTANEOUS | 1 refills | Status: DC
  Filled 2023-03-31: qty 2, 28d supply, fill #0
  Filled 2023-04-29: qty 2, 28d supply, fill #1
  Filled 2023-06-07: qty 2, 28d supply, fill #2

## 2023-04-01 LAB — WOUND CULTURE

## 2023-04-06 ENCOUNTER — Ambulatory Visit (INDEPENDENT_AMBULATORY_CARE_PROVIDER_SITE_OTHER): Payer: BC Managed Care – PPO | Admitting: Podiatry

## 2023-04-06 ENCOUNTER — Ambulatory Visit (INDEPENDENT_AMBULATORY_CARE_PROVIDER_SITE_OTHER): Payer: BC Managed Care – PPO

## 2023-04-06 DIAGNOSIS — M19072 Primary osteoarthritis, left ankle and foot: Secondary | ICD-10-CM | POA: Diagnosis not present

## 2023-04-06 MED ORDER — BETAMETHASONE SOD PHOS & ACET 6 (3-3) MG/ML IJ SUSP
3.0000 mg | Freq: Once | INTRAMUSCULAR | Status: DC
Start: 2023-04-06 — End: 2023-11-30

## 2023-04-06 MED ORDER — MELOXICAM 15 MG PO TABS
15.0000 mg | ORAL_TABLET | Freq: Every day | ORAL | 1 refills | Status: DC
Start: 2023-04-06 — End: 2023-08-01

## 2023-04-06 NOTE — Progress Notes (Signed)
Chief Complaint  Patient presents with   Foot Pain    Left foot pain . Patient states that he is having pain on the top of his left foot. Ongoing for about 2 months. Applying pressure sometimes makes pain worse.    Subjective:  52 y.o. male presenting today for gradual onset of left ankle pain has been going on for a few months now.  Patient denies any history of injury.  He does have a history of triple arthrodesis to the left foot.  He says that with excessive weight and pressure on the foot he does experience some tenderness and pain to the area.  He presents for further treatment evaluation  Past Medical History:  Diagnosis Date   Arthritis    GERD (gastroesophageal reflux disease)    Hypertension    Obesity    OSA on CPAP    Type 2 diabetes mellitus    followed by pcp   Wears partial dentures    lower    Past Surgical History:  Procedure Laterality Date   COLONOSCOPY WITH PROPOFOL  11/06/2012   Procedure: COLONOSCOPY WITH PROPOFOL;  Surgeon: Beverley Fiedler, MD;  Location: [facility];  Service: Gastroenterology;  Laterality: N/A;  Andrea/   COLONOSCOPY WITH PROPOFOL N/A 04/22/2022   Procedure: COLONOSCOPY WITH PROPOFOL;  Surgeon: Beverley Fiedler, MD;  Location: [facility];  Service: Gastroenterology;  Laterality: N/A;   GASTRIC ROUX-EN-Y N/A 05/02/2018   Procedure: LAPAROSCOPIC ROUX-EN-Y GASTRIC BYPASS WITH UPPER ENDOSCOPY;  Surgeon: Glenna Fellows, MD;  Location: [facility];  Service: General;  Laterality: N/A;   KNEE ARTHROSCOPY W/ ACL RECONSTRUCTION Right 11-24-2009   dr dean    Allergies  Allergen Reactions   Benazepril Hcl Cough    Severe cough   Tramadol Other (See Comments)    Severe dizziness    Objective / Physical Exam:  General:  The patient is alert and oriented x3 in no acute distress. Dermatology: Skin is warm, dry and supple bilateral lower extremities. Negative for open lesions or macerations. Vascular: Palpable pedal pulses bilaterally. No edema or  erythema noted. Capillary refill within normal limits. Neurological: Grossly intact via light touch Musculoskeletal Exam: Pain on palpation to the anterior lateral medial aspects of the patient's left ankle. Mild edema noted.  No tenderness throughout palpation of the triple arthrodesis site  Radiographic Exam LT foot and ankle 04/06/2023:  Normal osseous mineralization.  Triple arthrodesis with osseous union of the arthrodesis sites.  Hardware is intact and stable.  There does appear to be some osteophyte formation and peritubular spurring to the anterior portion of the ankle joint which correlates with the patient's pain clinically.  Assessment: 1.  DJD left ankle 2.  H/o triple arthrodesis left  -Patient evaluated.  X-rays reviewed -Injection of 0.5 cc Celestone Soluspan injected to the anterior aspect of the left ankle -Continue wearing good supportive shoes and sneakers.  Advised against going barefoot -Prescription for meloxicam 15 mg daily -Return to clinic as needed   Felecia Shelling, DPM [facility]  Dr. Felecia Shelling, DPM    [address] [place], [place] [address]                Office [phone]  Fax [phone]

## 2023-04-09 ENCOUNTER — Other Ambulatory Visit: Payer: Self-pay | Admitting: Internal Medicine

## 2023-04-09 DIAGNOSIS — K2100 Gastro-esophageal reflux disease with esophagitis, without bleeding: Secondary | ICD-10-CM

## 2023-04-29 ENCOUNTER — Other Ambulatory Visit (HOSPITAL_COMMUNITY): Payer: Self-pay

## 2023-04-29 ENCOUNTER — Other Ambulatory Visit: Payer: Self-pay | Admitting: Internal Medicine

## 2023-04-29 DIAGNOSIS — K2100 Gastro-esophageal reflux disease with esophagitis, without bleeding: Secondary | ICD-10-CM

## 2023-04-29 MED ORDER — OMEPRAZOLE 40 MG PO CPDR
40.0000 mg | DELAYED_RELEASE_CAPSULE | Freq: Every day | ORAL | 0 refills | Status: DC
Start: 2023-04-29 — End: 2023-08-09
  Filled 2023-04-29: qty 30, 30d supply, fill #0
  Filled 2023-06-07: qty 30, 30d supply, fill #1

## 2023-05-03 ENCOUNTER — Other Ambulatory Visit (HOSPITAL_COMMUNITY): Payer: Self-pay

## 2023-05-04 ENCOUNTER — Other Ambulatory Visit (HOSPITAL_COMMUNITY): Payer: Self-pay

## 2023-05-05 ENCOUNTER — Other Ambulatory Visit (HOSPITAL_COMMUNITY): Payer: Self-pay

## 2023-05-11 ENCOUNTER — Other Ambulatory Visit (HOSPITAL_COMMUNITY): Payer: Self-pay

## 2023-06-07 ENCOUNTER — Other Ambulatory Visit (HOSPITAL_COMMUNITY): Payer: Self-pay

## 2023-06-08 ENCOUNTER — Other Ambulatory Visit (HOSPITAL_COMMUNITY): Payer: Self-pay

## 2023-06-09 ENCOUNTER — Ambulatory Visit: Payer: BC Managed Care – PPO | Admitting: Internal Medicine

## 2023-06-15 ENCOUNTER — Ambulatory Visit: Payer: BC Managed Care – PPO | Admitting: Internal Medicine

## 2023-06-20 ENCOUNTER — Other Ambulatory Visit (HOSPITAL_COMMUNITY): Payer: Self-pay

## 2023-06-27 ENCOUNTER — Telehealth: Payer: Self-pay | Admitting: Internal Medicine

## 2023-06-27 ENCOUNTER — Ambulatory Visit: Payer: BC Managed Care – PPO | Admitting: Family Medicine

## 2023-06-27 NOTE — Telephone Encounter (Signed)
Patient called to say the price of his tirzepatide [facility]) 10 MG/0.5ML Pen has increased significantly. He would like to know if Dr. Yetta Barre wanted to prescribe something else or had another idea. Patient would like a call back at [phone].

## 2023-07-05 NOTE — Telephone Encounter (Signed)
Patient called back to check on the status of his request. He would like a call back at [phone].

## 2023-07-31 ENCOUNTER — Other Ambulatory Visit: Payer: Self-pay | Admitting: Podiatry

## 2023-08-09 ENCOUNTER — Telehealth: Payer: Self-pay | Admitting: Podiatry

## 2023-08-09 ENCOUNTER — Telehealth: Payer: Self-pay | Admitting: Internal Medicine

## 2023-08-09 ENCOUNTER — Encounter: Payer: Self-pay | Admitting: Podiatry

## 2023-08-09 DIAGNOSIS — E5111 Dry beriberi: Secondary | ICD-10-CM

## 2023-08-09 DIAGNOSIS — K2100 Gastro-esophageal reflux disease with esophagitis, without bleeding: Secondary | ICD-10-CM

## 2023-08-09 MED ORDER — VITAMIN B-1 100 MG PO TABS
100.0000 mg | ORAL_TABLET | Freq: Every day | ORAL | 0 refills | Status: DC
Start: 2023-08-09 — End: 2024-06-12

## 2023-08-09 MED ORDER — OMEPRAZOLE 40 MG PO CPDR
40.0000 mg | DELAYED_RELEASE_CAPSULE | Freq: Every day | ORAL | 0 refills | Status: DC
Start: 2023-08-09 — End: 2023-12-09

## 2023-08-09 NOTE — Telephone Encounter (Signed)
Pt called and is needing the paperwork for the handicap sticker please.

## 2023-08-09 NOTE — Telephone Encounter (Signed)
Prescription Request  08/09/2023  LOV: 03/29/2023  What is the name of the medication or equipment?  thiamine (VITAMIN B-1) 100 MG tablet [  omeprazole (PRILOSEC) 40 MG capsule   Have you contacted your pharmacy to request a refill? No   Which pharmacy would you like this sent to?  [id number] [place], [place] - [address] [place] [address] Phone: [phone] Fax: [phone]   Patient notified that their request is being sent to the clinical staff for review and that they should receive a response within 2 business days.   Please advise at Mobile [phone] (mobile)

## 2023-11-03 ENCOUNTER — Other Ambulatory Visit: Payer: Self-pay | Admitting: Internal Medicine

## 2023-11-03 ENCOUNTER — Telehealth: Payer: Self-pay | Admitting: Internal Medicine

## 2023-11-03 NOTE — Telephone Encounter (Signed)
Prescription Request  11/03/2023  LOV: 03/29/2023  What is the name of the medication or equipment? tirzepatide [facility]) 10 MG/0.5ML Pen   Have you contacted your pharmacy to request a refill? No   Which pharmacy would you like this sent to?   [facility]  [address], [place], [place]- [phone] F- [phone]  Patient notified that their request is being sent to the clinical staff for review and that they should receive a response within 2 business days.   Please advise at Mobile [phone] (mobile)   Pt stated he would like to start back taking this medication because he is able to afford it now.

## 2023-11-05 ENCOUNTER — Other Ambulatory Visit: Payer: Self-pay | Admitting: Internal Medicine

## 2023-11-05 DIAGNOSIS — K2100 Gastro-esophageal reflux disease with esophagitis, without bleeding: Secondary | ICD-10-CM

## 2023-11-07 NOTE — Telephone Encounter (Signed)
Patient has been scheduled for Dr. Yetta Barre' first opening on 11/30/2023.

## 2023-11-07 NOTE — Telephone Encounter (Signed)
Unable to reach patient. Left a very detailed messaged.

## 2023-11-23 ENCOUNTER — Encounter (HOSPITAL_COMMUNITY): Payer: Self-pay | Admitting: *Deleted

## 2023-11-30 ENCOUNTER — Encounter: Payer: Self-pay | Admitting: Internal Medicine

## 2023-11-30 ENCOUNTER — Ambulatory Visit: Payer: BC Managed Care – PPO | Admitting: Internal Medicine

## 2023-11-30 VITALS — BP 126/80 | HR 66 | Temp 98.4°F | Ht 71.0 in | Wt 300.2 lb

## 2023-11-30 DIAGNOSIS — E5111 Dry beriberi: Secondary | ICD-10-CM | POA: Diagnosis not present

## 2023-11-30 DIAGNOSIS — Z8042 Family history of malignant neoplasm of prostate: Secondary | ICD-10-CM

## 2023-11-30 DIAGNOSIS — E559 Vitamin D deficiency, unspecified: Secondary | ICD-10-CM | POA: Diagnosis not present

## 2023-11-30 DIAGNOSIS — Z7985 Long-term (current) use of injectable non-insulin antidiabetic drugs: Secondary | ICD-10-CM

## 2023-11-30 DIAGNOSIS — E118 Type 2 diabetes mellitus with unspecified complications: Secondary | ICD-10-CM

## 2023-11-30 DIAGNOSIS — Z9884 Bariatric surgery status: Secondary | ICD-10-CM

## 2023-11-30 DIAGNOSIS — E785 Hyperlipidemia, unspecified: Secondary | ICD-10-CM

## 2023-11-30 DIAGNOSIS — Z6841 Body Mass Index (BMI) 40.0 and over, adult: Secondary | ICD-10-CM | POA: Diagnosis not present

## 2023-11-30 DIAGNOSIS — Z23 Encounter for immunization: Secondary | ICD-10-CM | POA: Diagnosis not present

## 2023-11-30 DIAGNOSIS — K219 Gastro-esophageal reflux disease without esophagitis: Secondary | ICD-10-CM

## 2023-11-30 DIAGNOSIS — I10 Essential (primary) hypertension: Secondary | ICD-10-CM | POA: Diagnosis not present

## 2023-11-30 LAB — LIPID PANEL
Cholesterol: 170 mg/dL (ref 0–200)
HDL: 60.8 mg/dL (ref >39.00)
LDL Cholesterol: 91 mg/dL (ref 0–99)
NonHDL: 108.94
Total CHOL/HDL Ratio: 3
Triglycerides: 90 mg/dL (ref 0.0–149.0)
VLDL: 18 mg/dL (ref 0.0–40.0)

## 2023-11-30 LAB — BASIC METABOLIC PANEL
BUN: 21 mg/dL (ref 6–23)
CO2: 31 meq/L (ref 19–32)
Calcium: 9.5 mg/dL (ref 8.4–10.5)
Chloride: 104 meq/L (ref 96–112)
Creatinine, Ser: 0.92 mg/dL (ref 0.40–1.50)
GFR: 95.68 mL/min (ref >60.00)
Glucose, Bld: 81 mg/dL (ref 70–99)
Potassium: 4.1 meq/L (ref 3.5–5.1)
Sodium: 142 meq/L (ref 135–145)

## 2023-11-30 LAB — URINALYSIS, ROUTINE W REFLEX MICROSCOPIC
Bilirubin Urine: NEGATIVE
Hgb urine dipstick: NEGATIVE
Ketones, ur: NEGATIVE
Leukocytes,Ua: NEGATIVE
Nitrite: NEGATIVE
Specific Gravity, Urine: >=1.03 — AB (ref 1.000–1.030)
Total Protein, Urine: NEGATIVE
Urine Glucose: NEGATIVE
Urobilinogen, UA: 0.2 (ref 0.0–1.0)
pH: 6 (ref 5.0–8.0)

## 2023-11-30 LAB — MICROALBUMIN / CREATININE URINE RATIO
Creatinine,U: 130.2 mg/dL
Microalb Creat Ratio: 0.5 mg/g (ref 0.0–30.0)
Microalb, Ur: <0.7 mg/dL (ref 0.0–1.9)

## 2023-11-30 LAB — CBC WITH DIFFERENTIAL/PLATELET
Basophils Absolute: 0 10*3/uL (ref 0.0–0.1)
Basophils Relative: 0.5 % (ref 0.0–3.0)
Eosinophils Absolute: 0.1 10*3/uL (ref 0.0–0.7)
Eosinophils Relative: 1.6 % (ref 0.0–5.0)
HCT: 43.1 % (ref 39.0–52.0)
Hemoglobin: 14.1 g/dL (ref 13.0–17.0)
Lymphocytes Relative: 38.4 % (ref 12.0–46.0)
Lymphs Abs: 2.1 10*3/uL (ref 0.7–4.0)
MCHC: 32.8 g/dL (ref 30.0–36.0)
MCV: 88.2 fL (ref 78.0–100.0)
Monocytes Absolute: 0.6 10*3/uL (ref 0.1–1.0)
Monocytes Relative: 10.2 % (ref 3.0–12.0)
Neutro Abs: 2.7 10*3/uL (ref 1.4–7.7)
Neutrophils Relative %: 49.3 % (ref 43.0–77.0)
Platelets: 266 10*3/uL (ref 150.0–400.0)
RBC: 4.89 Mil/uL (ref 4.22–5.81)
RDW: 15.9 % — ABNORMAL HIGH (ref 11.5–15.5)
WBC: 5.5 10*3/uL (ref 4.0–10.5)

## 2023-11-30 LAB — HEMOGLOBIN A1C: Hgb A1c MFr Bld: 5.8 % (ref 4.6–6.5)

## 2023-11-30 LAB — HEPATIC FUNCTION PANEL
ALT: 14 U/L (ref 0–53)
AST: 18 U/L (ref 0–37)
Albumin: 4.5 g/dL (ref 3.5–5.2)
Alkaline Phosphatase: 69 U/L (ref 39–117)
Bilirubin, Direct: 0.1 mg/dL (ref 0.0–0.3)
Total Bilirubin: 0.5 mg/dL (ref 0.2–1.2)
Total Protein: 7.1 g/dL (ref 6.0–8.3)

## 2023-11-30 LAB — VITAMIN D 25 HYDROXY (VIT D DEFICIENCY, FRACTURES): VITD: 22.07 ng/mL — ABNORMAL LOW (ref 30.00–100.00)

## 2023-11-30 LAB — TSH: TSH: 2.58 u[IU]/mL (ref 0.35–5.50)

## 2023-11-30 LAB — PSA: PSA: 0.94 ng/mL (ref 0.10–4.00)

## 2023-11-30 MED ORDER — ZEPBOUND 2.5 MG/0.5ML ~~LOC~~ SOAJ: 2.5000 mg | SUBCUTANEOUS | 0 refills | Status: DC

## 2023-11-30 MED ORDER — VITAMIN D3 1.25 MG (50000 UT) PO TABS: 1.0000 | ORAL_TABLET | ORAL | 0 refills | Status: DC

## 2023-11-30 NOTE — Progress Notes (Signed)
Subjective:  Patient ID: Joseph Wood., male    DOB: 1971-04-06  Age: 52 y.o. MRN: [id number]  CC: Hyperlipidemia, Gastroesophageal Reflux, and Diabetes   HPI Dayon J [employer]. presents for f/up ---  Discussed the use of AI scribe software for clinical note transcription with the patient, who gave verbal consent to proceed.  History of Present Illness   The patient, with a history of diabetes and shoulder injury, reports a period of significant stress due to work-related issues and a shoulder injury that required surgery. He describes a period of financial hardship during which he was unable to afford his diabetes medication, Mounjaro. He has not been taking this medication for a while but has been monitoring his blood sugar levels, which range from 90s to 150s.  The patient also reports occasional episodes of hyperglycemia. He expresses a desire to restart Mounjaro to aid in weight loss, as he had been successful in losing weight while on this medication in the past.  Additionally, the patient has been experiencing indigestion, which he manages with a PPI as needed. He reports good control of these symptoms with this regimen.  The patient also has concerns about his prostate health due to a family history of cancer. He reports a normal prostate blood test a year ago and agrees to a repeat test.  Lastly, the patient mentions a recent dental issue, with a tooth breaking off the previous day. He also reports feeling a small nodule under his jaw, which he believes may be related to the dental issue.       Outpatient Medications Prior to Visit  Medication Sig Dispense Refill   acetaminophen (TYLENOL) 650 MG CR tablet Take 1,300 mg by mouth every 8 (eight) hours as needed for pain.     ASPIRIN 81 PO Take 81 mg by mouth daily.     blood glucose meter kit and supplies KIT Use to test blood sugar daily. DX E11.8 1 each 0   glucose blood (COOL BLOOD GLUCOSE TEST STRIPS) test strip  Use to test blood sugar daily. DX E11.8 100 each 3   Insulin Pen Needle (BD PEN NEEDLE MICRO U/F) 32G X 6 MM MISC Use to inject insulin daily. DX E11.8 100 each 3   Lancets MISC Use to test blood sugar daily. DX E11.8 100 each 3   meloxicam (MOBIC) 15 MG tablet TAKE 1 TABLET (15 MG TOTAL) BY MOUTH DAILY. (Patient taking differently: Take 15 mg by mouth daily as needed for pain.) 30 tablet 3   Multiple Vitamins-Minerals (BARIATRIC MULTIVITAMINS/IRON PO) Take 1 tablet by mouth daily.     omeprazole (PRILOSEC) 40 MG capsule Take 1 capsule (40 mg total) by mouth daily. 90 capsule 0   thiamine (VITAMIN B-1) 100 MG tablet Take 1 tablet (100 mg total) by mouth daily. 90 tablet 0   Cholecalciferol 125 MCG (5000 UT) capsule Take 1 capsule (5,000 Units total) by mouth daily. 90 capsule 1   Misc Natural Products (JOINT HEALTH) CAPS Take 2 tablets by mouth daily.     betamethasone acetate-betamethasone sodium phosphate (CELESTONE) injection 3 mg      No facility-administered medications prior to visit.    ROS Review of Systems  Constitutional:  Positive for unexpected weight change (wt gain). Negative for appetite change, chills, diaphoresis and fatigue.  HENT: Negative.    Eyes: Negative.   Respiratory: Negative.  Negative for chest tightness, shortness of breath and wheezing.   Cardiovascular:  Negative for chest  pain, palpitations and leg swelling.  Gastrointestinal:  Negative for abdominal pain, constipation, diarrhea, nausea and vomiting.  Endocrine: Negative.   Genitourinary: Negative.  Negative for difficulty urinating, penile swelling, scrotal swelling and testicular pain.  Musculoskeletal:  Positive for arthralgias. Negative for back pain and myalgias.  Skin:  Negative for color change and pallor.  Neurological: Negative.  Negative for dizziness and weakness.  Hematological:  Negative for adenopathy. Does not bruise/bleed easily.  Psychiatric/Behavioral: Negative.      Objective:  BP  126/80 (BP Location: Left Arm, Patient Position: Sitting, Cuff Size: Normal)   Pulse 66   Temp 98.4 F (36.9 C) (Oral)   Ht 5\' 11"  (1.803 m)   Wt (!) 300 lb 3.2 oz (136.2 kg)   SpO2 97%   BMI 41.87 kg/m   BP Readings from Last 3 Encounters:  11/30/23 126/80  03/29/23 128/80  12/01/22 124/82    Wt Readings from Last 3 Encounters:  11/30/23 (!) 300 lb 3.2 oz (136.2 kg)  03/29/23 276 lb (125.2 kg)  12/01/22 283 lb 3.2 oz (128.5 kg)    Physical Exam Vitals reviewed.  Constitutional:      General: He is not in acute distress.    Appearance: He is obese. He is not toxic-appearing or diaphoretic.  HENT:     Nose: Nose normal.     Mouth/Throat:     Mouth: Mucous membranes are moist.  Eyes:     General: No scleral icterus.    Conjunctiva/sclera: Conjunctivae normal.  Cardiovascular:     Rate and Rhythm: Normal rate and regular rhythm.     Heart sounds: No murmur heard. Pulmonary:     Breath sounds: No stridor. No wheezing, rhonchi or rales.  Abdominal:     General: Abdomen is protuberant. Bowel sounds are normal. There is no distension.     Palpations: Abdomen is soft. There is no hepatomegaly, splenomegaly or mass.     Tenderness: There is no abdominal tenderness.  Genitourinary:    Comments: He deferred on a GU/DRE Musculoskeletal:        General: Normal range of motion.     Cervical back: Neck supple.     Right lower leg: No edema.     Left lower leg: No edema.  Lymphadenopathy:     Cervical: No cervical adenopathy.  Skin:    General: Skin is warm and dry.     Findings: No rash.  Neurological:     General: No focal deficit present.     Mental Status: He is alert. Mental status is at baseline.  Psychiatric:        Mood and Affect: Mood normal.        Behavior: Behavior normal.     Lab Results  Component Value Date   WBC 5.5 11/30/2023   HGB 14.1 11/30/2023   HCT 43.1 11/30/2023   PLT 266.0 11/30/2023   GLUCOSE 81 11/30/2023   CHOL 170 11/30/2023   TRIG  90.0 11/30/2023   HDL 60.80 11/30/2023   LDLCALC 91 11/30/2023   ALT 14 11/30/2023   AST 18 11/30/2023   NA 142 11/30/2023   K 4.1 11/30/2023   CL 104 11/30/2023   CREATININE 0.92 11/30/2023   BUN 21 11/30/2023   CO2 31 11/30/2023   TSH 2.58 11/30/2023   PSA 0.94 11/30/2023   HGBA1C 5.8 11/30/2023   MICROALBUR <0.7 11/30/2023    No results found.  Assessment & Plan:  Need for immunization against influenza -  Flu vaccine trivalent PF, 6mos and older(Flulaval,Afluria,Fluarix,Fluzone)  HYPERTENSION, BENIGN ESSENTIAL- His BP is well controlled. -     Urinalysis, Routine w reflex microscopic; Future -     Basic metabolic panel; Future  Type II diabetes mellitus with manifestations (HCC) -     Urinalysis, Routine w reflex microscopic; Future -     Microalbumin / creatinine urine ratio; Future -     Basic metabolic panel; Future  Hyperlipidemia with target LDL less than 100 -     Lipid panel; Future -     TSH; Future  Morbid obesity with BMI of 50.0-59.9, adult (HCC) -     TSH; Future -     Hepatic function panel; Future -     Hemoglobin A1c; Future  Family history of prostate cancer in father -     PSA; Future  Vitamin D deficiency disease -     VITAMIN D 25 Hydroxy (Vit-D Deficiency, Fractures); Future -     Vitamin D3; Take 1 tablet by mouth once a week.  Dispense: 12 tablet; Refill: 0  History of gastric bypass -     VITAMIN D 25 Hydroxy (Vit-D Deficiency, Fractures); Future -     Vitamin D3; Take 1 tablet by mouth once a week.  Dispense: 12 tablet; Refill: 0  Thiamine deficiency neuropathy -     CBC with Differential/Platelet; Future  Gastroesophageal reflux disease without esophagitis- Sx's are adequately well controlled. -     CBC with Differential/Platelet; Future     Follow-up: Return in about 6 months (around 05/30/2024).  Sanda Linger, MD

## 2023-11-30 NOTE — Patient Instructions (Signed)
Gastroesophageal Reflux Disease, Adult    Gastroesophageal reflux (GER) happens when acid from the stomach flows up into the tube that connects the mouth and the stomach (esophagus). Normally, food travels down the esophagus and stays in the stomach to be digested. However, when a person has GER, food and stomach acid sometimes move back up into the esophagus. If this becomes a more serious problem, the person may be diagnosed with a disease called gastroesophageal reflux disease (GERD). GERD occurs when the reflux:  Happens often.  Causes frequent or severe symptoms.  Causes problems such as damage to the esophagus.  When stomach acid comes in contact with the esophagus, the acid may cause inflammation in the esophagus. Over time, GERD may create small holes (ulcers) in the lining of the esophagus.  What are the causes?  This condition is caused by a problem with the muscle between the esophagus and the stomach (lower esophageal sphincter, or LES). Normally, the LES muscle closes after food passes through the esophagus to the stomach. When the LES is weakened or abnormal, it does not close properly, and that allows food and stomach acid to go back up into the esophagus.  The LES can be weakened by certain dietary substances, medicines, and medical conditions, including:  Tobacco use.  Pregnancy.  Having a hiatal hernia.  Alcohol use.  Certain foods and beverages, such as coffee, chocolate, onions, and peppermint.  What increases the risk?  You are more likely to develop this condition if you:  Have an increased body weight.  Have a connective tissue disorder.  Take NSAIDs, such as ibuprofen.  What are the signs or symptoms?  Symptoms of this condition include:  Heartburn.  Difficult or painful swallowing and the feeling of having a lump in the throat.  A bitter taste in the mouth.  Bad breath and having a large amount of saliva.  Having an upset or bloated stomach and belching.  Chest pain. Different conditions can  cause chest pain. Make sure you see your health care provider if you experience chest pain.  Shortness of breath or wheezing.  Ongoing (chronic) cough or a nighttime cough.  Wearing away of tooth enamel.  Weight loss.  How is this diagnosed?  This condition may be diagnosed based on a medical history and a physical exam. To determine if you have mild or severe GERD, your health care provider may also monitor how you respond to treatment. You may also have tests, including:  A test to examine your stomach and esophagus with a small camera (endoscopy).  A test that measures the acidity level in your esophagus.  A test that measures how much pressure is on your esophagus.  A barium swallow or modified barium swallow test to show the shape, size, and functioning of your esophagus.  How is this treated?  Treatment for this condition may vary depending on how severe your symptoms are. Your health care provider may recommend:  Changes to your diet.  Medicine.  Surgery.  The goal of treatment is to help relieve your symptoms and to prevent complications.  Follow these instructions at home:  Eating and drinking    Follow a diet as recommended by your health care provider. This may involve avoiding foods and drinks such as:  Coffee and tea, with or without caffeine.  Drinks that contain alcohol.  Energy drinks and sports drinks.  Carbonated drinks or sodas.  Chocolate and cocoa.  Peppermint and mint flavorings.  Garlic and onions.  Horseradish.  Spicy and acidic foods, including peppers, chili powder, curry powder, vinegar, hot sauces, and barbecue sauce.  Citrus fruit juices and citrus fruits, such as oranges, lemons, and limes.  Tomato-based foods, such as red sauce, chili, salsa, and pizza with red sauce.  Fried and fatty foods, such as donuts, french fries, potato chips, and high-fat dressings.  High-fat meats, such as hot dogs and fatty cuts of red and white meats, such as rib eye steak, sausage, ham, and  bacon.  High-fat dairy items, such as whole milk, butter, and cream cheese.  Eat small, frequent meals instead of large meals.  Avoid drinking large amounts of liquid with your meals.  Avoid eating meals during the 2-3 hours before bedtime.  Avoid lying down right after you eat.  Do not exercise right after you eat.  Lifestyle    Do not use any products that contain nicotine or tobacco. These products include cigarettes, chewing tobacco, and vaping devices, such as e-cigarettes. If you need help quitting, ask your health care provider.  Try to reduce your stress by using methods such as yoga or meditation. If you need help reducing stress, ask your health care provider.  If you are overweight, reduce your weight to an amount that is healthy for you. Ask your health care provider for guidance about a safe weight loss goal.  General instructions  Pay attention to any changes in your symptoms.  Take over-the-counter and prescription medicines only as told by your health care provider. Do not take aspirin, ibuprofen, or other NSAIDs unless your health care provider told you to take these medicines.  Wear loose-fitting clothing. Do not wear anything tight around your waist that causes pressure on your abdomen.  Raise (elevate) the head of your bed about 6 inches (15 cm). You can use a wedge to do this.  Avoid bending over if this makes your symptoms worse.  Keep all follow-up visits. This is important.  Contact a health care provider if:  You have:  New symptoms.  Unexplained weight loss.  Difficulty swallowing or it hurts to swallow.  Wheezing or a persistent cough.  A hoarse voice.  Your symptoms do not improve with treatment.  Get help right away if:  You have sudden pain in your arms, neck, jaw, teeth, or back.  You suddenly feel sweaty, dizzy, or light-headed.  You have chest pain or shortness of breath.  You vomit and the vomit is green, yellow, or black, or it looks like blood or coffee grounds.  You faint.  You  have stool that is red, bloody, or black.  You cannot swallow, drink, or eat.  These symptoms may represent a serious problem that is an emergency. Do not wait to see if the symptoms will go away. Get medical help right away. Call your local emergency services (911 in the [place] Do not drive yourself to the hospital.  Summary  Gastroesophageal reflux happens when acid from the stomach flows up into the esophagus. GERD is a disease in which the reflux happens often, causes frequent or severe symptoms, or causes problems such as damage to the esophagus.  Treatment for this condition may vary depending on how severe your symptoms are. Your health care provider may recommend diet and lifestyle changes, medicine, or surgery.  Contact a health care provider if you have new or worsening symptoms.  Take over-the-counter and prescription medicines only as told by your health care provider. Do not take aspirin, ibuprofen, or other NSAIDs  unless your health care provider told you to do so.  Keep all follow-up visits as told by your health care provider. This is important.  This information is not intended to replace advice given to you by your health care provider. Make sure you discuss any questions you have with your health care provider.  Document Revised: 06/07/2020 Document Reviewed: 06/09/2020  Elsevier Patient Education  2024 [employer].

## 2023-12-01 ENCOUNTER — Telehealth: Payer: Self-pay

## 2023-12-01 NOTE — Telephone Encounter (Signed)
Copied from CRM [id number]. Topic: Clinical - Prescription Issue >> Dec 01, 2023  8:14 AM Steele Sizer wrote: Reason for CRM: Patient stated that he went to the pharmacy to pick up his Mounjaro 2.5mg  however they informed him that he would need prior authorization.

## 2023-12-02 ENCOUNTER — Other Ambulatory Visit (HOSPITAL_COMMUNITY): Payer: Self-pay

## 2023-12-02 ENCOUNTER — Telehealth: Payer: Self-pay

## 2023-12-02 NOTE — Telephone Encounter (Signed)
Copied from CRM [id number]. Topic: Clinical - Prescription Issue >> Dec 02, 2023 11:55 AM Clayton Bibles wrote: Reason for CRM: Joseph Wood is calling back about his med Mounjaro 2.5mg  the pharmacy has not received prior authorization. Please order through [facility] as a 90 day supply. Please call Srijan with questions. Thanks

## 2023-12-02 NOTE — Telephone Encounter (Signed)
Pt insurance will not cover Zepbound per other PA pharmacy encounter and they are requesting Mounjaro order instead

## 2023-12-02 NOTE — Telephone Encounter (Signed)
Pharmacy Patient Advocate Encounter   Received notification from Pt Calls Messages that prior authorization for [facility] 2.5mg /0.46ml is required/requested.   Insurance verification completed.   The patient is insured through [place] .   Per test claim: Received a paid claim for the [facility] 2.5mg /0.9ml. Per looking at the med list for the patient. It is listed at [employer] and his insurance does not cover it.   Can you please send in an order for [facility] for the patient? Please cancel the order for Zepbound. Thanks.

## 2023-12-03 ENCOUNTER — Other Ambulatory Visit: Payer: Self-pay | Admitting: Internal Medicine

## 2023-12-03 DIAGNOSIS — E118 Type 2 diabetes mellitus with unspecified complications: Secondary | ICD-10-CM

## 2023-12-03 MED ORDER — MOUNJARO 2.5 MG/0.5ML ~~LOC~~ SOAJ: 2.5000 mg | SUBCUTANEOUS | 0 refills | Status: DC

## 2023-12-09 ENCOUNTER — Telehealth: Payer: Self-pay

## 2023-12-09 ENCOUNTER — Other Ambulatory Visit: Payer: Self-pay

## 2023-12-09 DIAGNOSIS — K2100 Gastro-esophageal reflux disease with esophagitis, without bleeding: Secondary | ICD-10-CM

## 2023-12-09 MED ORDER — OMEPRAZOLE 40 MG PO CPDR: 40.0000 mg | DELAYED_RELEASE_CAPSULE | Freq: Every day | ORAL | 1 refills | Status: DC

## 2023-12-09 NOTE — Telephone Encounter (Signed)
Medication has been refilled.

## 2023-12-09 NOTE — Telephone Encounter (Signed)
Copied from CRM [id number]. Topic: Clinical - Medication Refill >> Dec 09, 2023 12:39 PM Thomes Dinning wrote: Most Recent Primary Care Visit:  Provider: Etta Grandchild  Department: [facility]  Visit Type: OFFICE VISIT  Date: 11/30/2023  Medication: omeprazole (PRILOSEC) 40 MG capsule  Has the patient contacted their pharmacy? Yes (Agent: If no, request that the patient contact the pharmacy for the refill. If patient does not wish to contact the pharmacy document the reason why and proceed with request.) (Agent: If yes, when and what did the pharmacy advise?)  Is this the correct pharmacy for this prescription? Yes If no, delete pharmacy and type the correct one.  This is the patient's preferred pharmacy:  [facility] [address] [place] [facility] [address] [place] [address] Phone: [phone] Fax: [phone]  Has the prescription been filled recently? No  Is the patient out of the medication? Yes  Has the patient been seen for an appointment in the last year OR does the patient have an upcoming appointment? Yes  Can we respond through MyChart? Yes  Agent: Please be advised that Rx refills may take up to 3 business days. We ask that you follow-up with your pharmacy.

## 2023-12-12 ENCOUNTER — Telehealth: Payer: Self-pay

## 2023-12-12 ENCOUNTER — Other Ambulatory Visit (HOSPITAL_COMMUNITY): Payer: Self-pay

## 2023-12-12 NOTE — Telephone Encounter (Signed)
Pharmacy Patient Advocate Encounter   Received notification from Pt Calls Messages that prior authorization for Mounjaro 2.5mg /0.73ml is required/requested.   Insurance verification completed.   The patient is insured through [employer] .   Per test claim: PA required; PA submitted to above mentioned insurance via CoverMyMeds Key/confirmation #/EOC  [id number] Status is pending

## 2023-12-12 NOTE — Telephone Encounter (Signed)
Copied from CRM [id number]. Topic: Clinical - Prescription Issue >> Dec 12, 2023 12:16 PM Elizebeth Brooking wrote:  Reason for CRM: [facility] that the tirzepatide [facility]) 2.5 MG/0.5ML Pen is not covered by insurance. Is asking for understanding if the prescription is covered under his insurance or not he is also requesting a callback

## 2023-12-12 NOTE — Telephone Encounter (Signed)
Copied from CRM [id number]. Topic: Clinical - Prescription Issue >> Dec 09, 2023  5:05 PM Denese Killings wrote:  Reason for CRM: [facility] and [facility] they advised that prescription for tirzepatide [facility]) 2.5 MG/0.5ML Pen was not covered.  Patient is requesting a callback regarding issue.

## 2023-12-12 NOTE — Telephone Encounter (Signed)
Pharmacy Patient Advocate Encounter  Received notification from [facility] that Prior Authorization for [facility] 2.5mg /0.75ml has been APPROVED from 11/12/23 to 12/11/24   PA #/Case ID/Reference #: [id number]

## 2023-12-12 NOTE — Telephone Encounter (Signed)
Handled by other means of communication

## 2023-12-12 NOTE — Telephone Encounter (Signed)
This has been handled by other means of communication.

## 2023-12-12 NOTE — Telephone Encounter (Signed)
Patient needs a PA done for his Mounjaro.

## 2023-12-12 NOTE — Telephone Encounter (Signed)
Spoke with patient and advised him that his medication has been approved and sent to the mail pharmacy . He gave a verbal understanding and stated that he would reach out to the pharmacy to see when his medication would be delivered. I also advised him to give [place] a call if he's having any more issues.

## 2024-02-02 ENCOUNTER — Other Ambulatory Visit: Payer: Self-pay | Admitting: Internal Medicine

## 2024-02-02 DIAGNOSIS — E559 Vitamin D deficiency, unspecified: Secondary | ICD-10-CM

## 2024-02-02 DIAGNOSIS — Z9884 Bariatric surgery status: Secondary | ICD-10-CM

## 2024-02-04 ENCOUNTER — Other Ambulatory Visit: Payer: Self-pay | Admitting: Internal Medicine

## 2024-02-04 DIAGNOSIS — G4733 Obstructive sleep apnea (adult) (pediatric): Secondary | ICD-10-CM

## 2024-02-04 DIAGNOSIS — E118 Type 2 diabetes mellitus with unspecified complications: Secondary | ICD-10-CM

## 2024-02-06 ENCOUNTER — Other Ambulatory Visit: Payer: Self-pay | Admitting: Internal Medicine

## 2024-02-17 ENCOUNTER — Other Ambulatory Visit: Payer: Self-pay | Admitting: Internal Medicine

## 2024-02-20 ENCOUNTER — Other Ambulatory Visit: Payer: Self-pay | Admitting: Internal Medicine

## 2024-02-20 ENCOUNTER — Other Ambulatory Visit (HOSPITAL_COMMUNITY): Payer: Self-pay

## 2024-02-20 DIAGNOSIS — G4733 Obstructive sleep apnea (adult) (pediatric): Secondary | ICD-10-CM

## 2024-02-20 DIAGNOSIS — E118 Type 2 diabetes mellitus with unspecified complications: Secondary | ICD-10-CM

## 2024-02-20 MED ORDER — TIRZEPATIDE 10 MG/0.5ML ~~LOC~~ SOAJ
10.0000 mg | SUBCUTANEOUS | 0 refills | Status: DC
Start: 2024-02-20 — End: 2024-06-12
  Filled 2024-02-20: qty 2, 28d supply, fill #0
  Filled 2024-03-15: qty 2, 28d supply, fill #1

## 2024-03-15 ENCOUNTER — Other Ambulatory Visit (HOSPITAL_COMMUNITY): Payer: Self-pay

## 2024-03-19 ENCOUNTER — Ambulatory Visit
Admission: EM | Admit: 2024-03-19 | Discharge: 2024-03-19 | Disposition: A | Discharge status: Home / Self Care | Attending: Family Medicine | Admitting: Family Medicine

## 2024-03-19 DIAGNOSIS — L03114 Cellulitis of left upper limb: Secondary | ICD-10-CM

## 2024-03-19 DIAGNOSIS — L249 Irritant contact dermatitis, unspecified cause: Secondary | ICD-10-CM

## 2024-03-19 MED ORDER — TRIAMCINOLONE ACETONIDE 0.1 % EX CREA: 1.0000 | TOPICAL_CREAM | Freq: Two times a day (BID) | CUTANEOUS | 0 refills | Status: AC

## 2024-03-19 MED ORDER — CEPHALEXIN 500 MG PO CAPS: 500.0000 mg | ORAL_CAPSULE | Freq: Three times a day (TID) | ORAL | 0 refills | Status: DC

## 2024-03-19 NOTE — ED Provider Notes (Signed)
 [facility]  Note:  This document was prepared using Conservation officer, historic buildings and may include unintentional dictation errors.  MRN: [id number] DOB: 1971-05-04  Subjective:   Joseph Wood. is a 53 y.o. male presenting for 3-day history of acute onset left shoulder redness, irritation with warmth.  Symptoms started from an insect bite.  Patient cannot recall what it was but he was outdoors and felt a quick cracking sensation directly over his left shoulder.  The symptoms developed over the next 3 days.  No fever, drainage of pus or bleeding.  No current facility-administered medications for this encounter.  Current Outpatient Medications:    acetaminophen (TYLENOL) 650 MG CR tablet, Take 1,300 mg by mouth every 8 (eight) hours as needed for pain., Disp: , Rfl:    ASPIRIN 81 PO, Take 81 mg by mouth daily., Disp: , Rfl:    blood glucose meter kit and supplies KIT, Use to test blood sugar daily. DX E11.8, Disp: 1 each, Rfl: 0   Cholecalciferol (VITAMIN D3) 1.25 MG (50000 UT) CAPS, TAKE 1 TABLET BY MOUTH ONE TIME PER WEEK, Disp: 12 capsule, Rfl: 0   glucose blood (COOL BLOOD GLUCOSE TEST STRIPS) test strip, Use to test blood sugar daily. DX E11.8, Disp: 100 each, Rfl: 3   Lancets MISC, Use to test blood sugar daily. DX E11.8, Disp: 100 each, Rfl: 3   meloxicam (MOBIC) 15 MG tablet, TAKE 1 TABLET (15 MG TOTAL) BY MOUTH DAILY. (Patient taking differently: Take 15 mg by mouth daily as needed for pain.), Disp: 30 tablet, Rfl: 3   Multiple Vitamins-Minerals (BARIATRIC MULTIVITAMINS/IRON PO), Take 1 tablet by mouth daily., Disp: , Rfl:    omeprazole (PRILOSEC) 40 MG capsule, Take 1 capsule (40 mg total) by mouth daily., Disp: 90 capsule, Rfl: 1   thiamine (VITAMIN B-1) 100 MG tablet, Take 1 tablet (100 mg total) by mouth daily., Disp: 90 tablet, Rfl: 0   tirzepatide (MOUNJARO) 10 MG/0.5ML Pen, Inject 10 mg into the skin once a week., Disp: 6 mL, Rfl: 0   Insulin  Pen Needle (BD PEN NEEDLE MICRO U/F) 32G X 6 MM MISC, Use to inject insulin daily. DX E11.8, Disp: 100 each, Rfl: 3   Allergies  Allergen Reactions   Benazepril Hcl Cough    Severe cough   Tramadol Other (See Comments)    Severe dizziness    Past Medical History:  Diagnosis Date   Arthritis    GERD (gastroesophageal reflux disease)    Hypertension    Obesity    OSA on CPAP    Type 2 diabetes mellitus (HCC)    followed by pcp   Wears partial dentures    lower     Past Surgical History:  Procedure Laterality Date   COLONOSCOPY WITH PROPOFOL  11/06/2012   Procedure: COLONOSCOPY WITH PROPOFOL;  Surgeon: Beverley Fiedler, MD;  Location: [facility];  Service: Gastroenterology;  Laterality: N/A;  Andrea/   COLONOSCOPY WITH PROPOFOL N/A 04/22/2022   Procedure: COLONOSCOPY WITH PROPOFOL;  Surgeon: Beverley Fiedler, MD;  Location: [facility];  Service: Gastroenterology;  Laterality: N/A;   GASTRIC ROUX-EN-Y N/A 05/02/2018   Procedure: LAPAROSCOPIC ROUX-EN-Y GASTRIC BYPASS WITH UPPER ENDOSCOPY;  Surgeon: Glenna Fellows, MD;  Location: [facility];  Service: General;  Laterality: N/A;   KNEE ARTHROSCOPY W/ ACL RECONSTRUCTION Right 11-24-2009   dr dean    Family History  Problem Relation Age of Onset   Arthritis Mother  Hypertension Mother    Breast cancer Mother    Diabetes Mother    Aneurysm Father        aortic   Prostate cancer Father    Hypertension Father    Diabetes Father    Hypertension Sister    Rheum arthritis Sister    Hypertension Brother    Cancer Other     Social History   Tobacco Use   Smoking status: Former    Types: Cigars    Quit date: 04/24/1989    Years since quitting: 34.9   Smokeless tobacco: Never   Tobacco comments:    occaional cigars, a couple a year  Vaping Use   Vaping status: Never Used  Substance Use Topics   Alcohol use: No   Drug use: No    ROS   Objective:   Vitals: BP 131/84 (BP Location: Left Arm)   Pulse 72   Temp 98.2 F  (36.8 C) (Oral)   Resp 16   SpO2 96%   Physical Exam Constitutional:      General: He is not in acute distress.    Appearance: Normal appearance. He is well-developed and normal weight. He is not ill-appearing, toxic-appearing or diaphoretic.  HENT:     Head: Normocephalic and atraumatic.     Right Ear: External ear normal.     Left Ear: External ear normal.     Nose: Nose normal.     Mouth/Throat:     Pharynx: Oropharynx is clear.  Eyes:     General: No scleral icterus.       Right eye: No discharge.        Left eye: No discharge.     Extraocular Movements: Extraocular movements intact.  Cardiovascular:     Rate and Rhythm: Normal rate.  Pulmonary:     Effort: Pulmonary effort is normal.  Musculoskeletal:     Cervical back: Normal range of motion.  Skin:      Neurological:     Mental Status: He is alert and oriented to person, place, and time.  Psychiatric:        Mood and Affect: Mood normal.        Behavior: Behavior normal.        Thought Content: Thought content normal.        Judgment: Judgment normal.     Assessment and Plan :   PDMP not reviewed this encounter.  1. Irritant contact dermatitis, unspecified trigger   2. Cellulitis of left upper extremity    Suspect primary issue is an irritant contact dermatitis that has complicated to cellulitis.  Recommended using topical steroid and supplement with cephalexin for resulting cellulitis.  Counseled patient on potential for adverse effects with medications prescribed/recommended today, ER and return-to-clinic precautions discussed, patient verbalized understanding.    Wallis Bamberg, [place] 03/19/24 1431

## 2024-03-19 NOTE — Discharge Instructions (Signed)
 Apply the steroid cream to the red area twice daily for 1 week. Take the antibiotic for the resulting cellulitis infection.

## 2024-03-19 NOTE — ED Triage Notes (Signed)
 Pt is here with insect bite to his left shoulder x 3 days. Patient states redness and swelling.  Home Intervention: None

## 2024-03-31 ENCOUNTER — Other Ambulatory Visit: Payer: Self-pay | Admitting: Podiatry

## 2024-05-09 ENCOUNTER — Ambulatory Visit: Payer: Self-pay

## 2024-05-09 NOTE — Telephone Encounter (Signed)
 Copied from CRM [id number]. Topic: Clinical - Medication Prior Auth >> May 09, 2024  5:36 PM Shereese L wrote: Reason for CRM: patient changed insurance to [employer] and needs a prior auth for  tirzepatide  (MOUNJARO ) 10 MG/0.5ML Pen Please call [phone]

## 2024-05-11 ENCOUNTER — Other Ambulatory Visit (HOSPITAL_COMMUNITY): Payer: Self-pay

## 2024-05-11 ENCOUNTER — Telehealth: Payer: Self-pay

## 2024-05-11 NOTE — Telephone Encounter (Signed)
 Pharmacy Patient Advocate Encounter  Received notification from [facility] that Prior Authorization for Mounjaro  10mg /0.58ml has been DENIED.  See denial reason below. No denial letter attached in CMM. Will attach denial letter to Media tab once received.   PA #/Case ID/Reference #: [id number]

## 2024-05-11 NOTE — Telephone Encounter (Signed)
 Pharmacy Patient Advocate Encounter   Received notification from Physician's Office that prior authorization for Mounjaro  10mg /0.45ml is required/requested.   Insurance verification completed.   The patient is insured through [facility] .   Per test claim: PA required; PA submitted to above mentioned insurance via CoverMyMeds Key/confirmation #/EOC [id number] Status is pending

## 2024-05-11 NOTE — Telephone Encounter (Signed)
 Patient has been made aware and gave a verbal understanding.

## 2024-05-11 NOTE — Telephone Encounter (Signed)
**Note De-identified  [place] Obfuscation** Please advise 

## 2024-05-13 ENCOUNTER — Other Ambulatory Visit: Payer: Self-pay | Admitting: Internal Medicine

## 2024-05-13 DIAGNOSIS — K2100 Gastro-esophageal reflux disease with esophagitis, without bleeding: Secondary | ICD-10-CM

## 2024-05-14 ENCOUNTER — Encounter: Payer: Self-pay | Admitting: Podiatry

## 2024-05-14 ENCOUNTER — Ambulatory Visit (INDEPENDENT_AMBULATORY_CARE_PROVIDER_SITE_OTHER)

## 2024-05-14 ENCOUNTER — Ambulatory Visit (INDEPENDENT_AMBULATORY_CARE_PROVIDER_SITE_OTHER): Admitting: Podiatry

## 2024-05-14 DIAGNOSIS — G5792 Unspecified mononeuropathy of left lower limb: Secondary | ICD-10-CM

## 2024-05-14 DIAGNOSIS — G5762 Lesion of plantar nerve, left lower limb: Secondary | ICD-10-CM

## 2024-05-14 DIAGNOSIS — E119 Type 2 diabetes mellitus without complications: Secondary | ICD-10-CM | POA: Diagnosis not present

## 2024-05-14 DIAGNOSIS — M722 Plantar fascial fibromatosis: Secondary | ICD-10-CM

## 2024-05-14 MED ORDER — BETAMETHASONE SOD PHOS & ACET 6 (3-3) MG/ML IJ SUSP
3.0000 mg | Freq: Once | INTRAMUSCULAR | Status: AC
  Administered 2024-05-14: 3 mg via INTRA_ARTICULAR

## 2024-05-14 NOTE — Progress Notes (Signed)
 Chief Complaint  Patient presents with   Foot Pain    "I been having some bad pain in this arch." N - arch pain L - medial midfoot left D - 2 mos O - suddenly C - numb, pins and needles, stings A - up on it a while T - none    Subjective:  53 y.o. male presenting today for evaluation of numbness with pins-and-needles and burning sensation to the medial aspect of the left foot.  Ongoing for about 1-2 months.  No history of injury.  History of triple arthrodesis to the left foot  Past Medical History:  Diagnosis Date   Arthritis    GERD (gastroesophageal reflux disease)    Hypertension    Obesity    OSA on CPAP    Type 2 diabetes mellitus (HCC)    followed by pcp   Wears partial dentures    lower    Past Surgical History:  Procedure Laterality Date   COLONOSCOPY WITH PROPOFOL   11/06/2012   Procedure: COLONOSCOPY WITH PROPOFOL ;  Surgeon: Nannette Babe, MD;  Location: [facility];  Service: Gastroenterology;  Laterality: N/A;  Andrea/   COLONOSCOPY WITH PROPOFOL  N/A 04/22/2022   Procedure: COLONOSCOPY WITH PROPOFOL ;  Surgeon: Nannette Babe, MD;  Location: [facility];  Service: Gastroenterology;  Laterality: N/A;   GASTRIC ROUX-EN-Y N/A 05/02/2018   Procedure: LAPAROSCOPIC ROUX-EN-Y GASTRIC BYPASS WITH UPPER ENDOSCOPY;  Surgeon: Ayesha Lente, MD;  Location: [facility];  Service: General;  Laterality: N/A;   KNEE ARTHROSCOPY W/ ACL RECONSTRUCTION Right 11-24-2009   dr dean    Allergies  Allergen Reactions   Benazepril  Hcl Cough    Severe cough   Tramadol  Other (See Comments)    Severe dizziness    Objective / Physical Exam:  General:  The patient is alert and oriented x3 in no acute distress. Dermatology: Skin is warm, dry and supple bilateral lower extremities. Negative for open lesions or macerations. Vascular: Palpable pedal pulses bilaterally. No edema or erythema noted. Capillary refill within normal limits. Neurological: Grossly intact via light touch.   Positive Tinel sign at the medial aspect of the foot along the previous triple arthrodesis incision site Musculoskeletal Exam: No tenderness throughout palpation.  Stability of the foot noted Radiographic Exam LT foot and ankle 05/14/2024:  Normal osseous mineralization.  Triple arthrodesis with osseous union of the arthrodesis sites.  Hardware is intact and stable.  There does appear to be some osteophyte formation and peritubular spurring to the anterior portion of the ankle joint which correlates with the patient's pain clinically.  Assessment: 1.  DJD left ankle 2.  H/o triple arthrodesis left 3.  Neuritis left foot  -Patient evaluated.  X-rays reviewed -Injection of 0.5 cc Celestone  Soluspan injected along the medial aspect of the left foot more superficial along the neuritis area -Continue wearing good supportive shoes and sneakers.  Advised against going barefoot -Prescription for meloxicam  15 mg daily - OTC power step insoles were dispensed.  Wear daily with good supportive tennis shoes -Handicap parking placard provided for the patient today -Return to clinic PRN   Dot Gazella, DPM [facility]  Dr. Dot Gazella, DPM    [address], [place] [address]  Office [phone]  Fax [phone]

## 2024-05-16 ENCOUNTER — Telehealth: Payer: Self-pay | Admitting: Internal Medicine

## 2024-05-16 NOTE — Telephone Encounter (Signed)
 Copied from CRM [id number]. Topic: Referral - Prior Authorization Question >> May 16, 2024 11:31 AM Luane Rumps D wrote: Reason for CRM: Patient is calling to check that the PA request for tirzepatide  (MOUNJARO ) 10 MG/0.5ML Pen has been received. He also said there were 2 others as well, trulicity and ozempic. Patient also said he needs a new PA for omeprazole  (PRILOSEC) [id number] [place] [place] - [address]

## 2024-05-18 NOTE — Telephone Encounter (Signed)
 Patient was denied for Mounjaro . He said his insurance company states that he will qualify for Trulicity or Ozempic. Is there any way that you could try him on either one of these medication in place of the mounjaro  ?

## 2024-05-21 ENCOUNTER — Other Ambulatory Visit (HOSPITAL_COMMUNITY): Payer: Self-pay

## 2024-05-21 ENCOUNTER — Telehealth: Payer: Self-pay

## 2024-05-21 NOTE — Telephone Encounter (Signed)
 Pharmacy Patient Advocate Encounter   Received notification from CoverMyMeds that prior authorization for Ozempic (0.25 or 0.5 MG/DOSE) 2MG /3ML pen-injectors is required/requested.   Insurance verification completed.   The patient is insured through [facility] .   Per test claim: PA required; PA submitted to above mentioned insurance via CoverMyMeds Key/confirmation #/EOC [id number] Status is pending

## 2024-05-21 NOTE — Telephone Encounter (Signed)
 ERROR

## 2024-05-21 NOTE — Telephone Encounter (Signed)
 Pharmacy Patient Advocate Encounter  Received notification from [facility] that Prior Authorization for Ozempic (0.25 or 0.5 MG/DOSE) 2MG /3ML pen-injectors has been APPROVED from 05/21/24 to 05/21/25   PA #/Case ID/Reference #: [id number]

## 2024-05-23 ENCOUNTER — Telehealth: Payer: Self-pay | Admitting: Internal Medicine

## 2024-05-23 ENCOUNTER — Other Ambulatory Visit: Payer: Self-pay | Admitting: Internal Medicine

## 2024-05-23 DIAGNOSIS — Z9884 Bariatric surgery status: Secondary | ICD-10-CM

## 2024-05-23 DIAGNOSIS — E559 Vitamin D deficiency, unspecified: Secondary | ICD-10-CM

## 2024-05-23 NOTE — Telephone Encounter (Signed)
 Copied from CRM [id number]. Topic: Clinical - Medication Prior Auth >> May 23, 2024 12:46 PM Deaijah H wrote: Reason for CRM: Patient would like to know if he is covered under for [facility] and would like to have a prior authorization to be sent to insurance [employer]. If needed, please call [phone]

## 2024-05-24 NOTE — Telephone Encounter (Signed)
 Please advise. Patient wants to try this medicine since he is being denied for the mounjaro 

## 2024-05-30 NOTE — Telephone Encounter (Signed)
 Copied from CRM [id number]. Topic: Clinical - Medication Question >> May 30, 2024 12:06 PM Antonieta Kitten wrote: Reason for CRM: Patient states that he has been approved for medicaid and would like to know if he can switch from Mounjaro  to Ozempic

## 2024-05-31 NOTE — Telephone Encounter (Signed)
 LVM for patient, overdue for an appt

## 2024-06-12 ENCOUNTER — Encounter: Payer: Self-pay | Admitting: Internal Medicine

## 2024-06-12 ENCOUNTER — Ambulatory Visit: Admitting: Internal Medicine

## 2024-06-12 VITALS — BP 140/82 | HR 84 | Temp 98.3°F | Resp 16 | Ht 71.0 in | Wt 297.2 lb

## 2024-06-12 DIAGNOSIS — Z6841 Body Mass Index (BMI) 40.0 and over, adult: Secondary | ICD-10-CM

## 2024-06-12 DIAGNOSIS — G5793 Unspecified mononeuropathy of bilateral lower limbs: Secondary | ICD-10-CM

## 2024-06-12 DIAGNOSIS — Z23 Encounter for immunization: Secondary | ICD-10-CM | POA: Diagnosis not present

## 2024-06-12 DIAGNOSIS — E559 Vitamin D deficiency, unspecified: Secondary | ICD-10-CM

## 2024-06-12 DIAGNOSIS — I10 Essential (primary) hypertension: Secondary | ICD-10-CM | POA: Diagnosis not present

## 2024-06-12 DIAGNOSIS — Z7985 Long-term (current) use of injectable non-insulin antidiabetic drugs: Secondary | ICD-10-CM

## 2024-06-12 DIAGNOSIS — E5111 Dry beriberi: Secondary | ICD-10-CM

## 2024-06-12 DIAGNOSIS — E118 Type 2 diabetes mellitus with unspecified complications: Secondary | ICD-10-CM | POA: Diagnosis not present

## 2024-06-12 DIAGNOSIS — Z9884 Bariatric surgery status: Secondary | ICD-10-CM

## 2024-06-12 LAB — FOLATE: Folate: >23.2 ng/mL (ref >5.9)

## 2024-06-12 LAB — VITAMIN B12: Vitamin B-12: 287 pg/mL (ref 211–911)

## 2024-06-12 LAB — URINALYSIS, ROUTINE W REFLEX MICROSCOPIC
Bilirubin Urine: NEGATIVE
Hgb urine dipstick: NEGATIVE
Leukocytes,Ua: NEGATIVE
Nitrite: NEGATIVE
Specific Gravity, Urine: >=1.03 — AB (ref 1.000–1.030)
Total Protein, Urine: NEGATIVE
Urine Glucose: NEGATIVE
Urobilinogen, UA: 0.2 (ref 0.0–1.0)
pH: 6 (ref 5.0–8.0)

## 2024-06-12 LAB — CBC WITH DIFFERENTIAL/PLATELET
Basophils Absolute: 0 10*3/uL (ref 0.0–0.1)
Basophils Relative: 0.4 % (ref 0.0–3.0)
Eosinophils Absolute: 0.1 10*3/uL (ref 0.0–0.7)
Eosinophils Relative: 1.5 % (ref 0.0–5.0)
HCT: 41.3 % (ref 39.0–52.0)
Hemoglobin: 13.5 g/dL (ref 13.0–17.0)
Lymphocytes Relative: 24.1 % (ref 12.0–46.0)
Lymphs Abs: 1.7 10*3/uL (ref 0.7–4.0)
MCHC: 32.7 g/dL (ref 30.0–36.0)
MCV: 88.3 fl (ref 78.0–100.0)
Monocytes Absolute: 0.6 10*3/uL (ref 0.1–1.0)
Monocytes Relative: 7.8 % (ref 3.0–12.0)
Neutro Abs: 4.7 10*3/uL (ref 1.4–7.7)
Neutrophils Relative %: 66.2 % (ref 43.0–77.0)
Platelets: 245 10*3/uL (ref 150.0–400.0)
RBC: 4.68 Mil/uL (ref 4.22–5.81)
RDW: 14.3 % (ref 11.5–15.5)
WBC: 7.1 10*3/uL (ref 4.0–10.5)

## 2024-06-12 LAB — BASIC METABOLIC PANEL WITH GFR
BUN: 18 mg/dL (ref 6–23)
CO2: 30 meq/L (ref 19–32)
Calcium: 9.1 mg/dL (ref 8.4–10.5)
Chloride: 106 meq/L (ref 96–112)
Creatinine, Ser: 0.97 mg/dL (ref 0.40–1.50)
GFR: 89.46 mL/min (ref >60.00)
Glucose, Bld: 72 mg/dL (ref 70–99)
Potassium: 3.7 meq/L (ref 3.5–5.1)
Sodium: 143 meq/L (ref 135–145)

## 2024-06-12 LAB — VITAMIN D 25 HYDROXY (VIT D DEFICIENCY, FRACTURES): VITD: 38.86 ng/mL (ref 30.00–100.00)

## 2024-06-12 LAB — MICROALBUMIN / CREATININE URINE RATIO
Creatinine,U: 181.7 mg/dL
Microalb Creat Ratio: 5.7 mg/g (ref 0.0–30.0)
Microalb, Ur: 1 mg/dL (ref 0.0–1.9)

## 2024-06-12 LAB — HEMOGLOBIN A1C: Hgb A1c MFr Bld: 5.6 % (ref 4.6–6.5)

## 2024-06-12 MED ORDER — SEMAGLUTIDE-WEIGHT MANAGEMENT 0.25 MG/0.5ML ~~LOC~~ SOAJ: 0.2500 mg | SUBCUTANEOUS | 0 refills | Status: DC

## 2024-06-12 MED ORDER — SEMAGLUTIDE-WEIGHT MANAGEMENT 2.4 MG/0.75ML ~~LOC~~ SOAJ: 2.4000 mg | SUBCUTANEOUS | 0 refills | Status: DC

## 2024-06-12 MED ORDER — SEMAGLUTIDE-WEIGHT MANAGEMENT 1 MG/0.5ML ~~LOC~~ SOAJ: 1.0000 mg | SUBCUTANEOUS | 0 refills | Status: DC

## 2024-06-12 MED ORDER — VITAMIN B-1 100 MG PO TABS: 100.0000 mg | ORAL_TABLET | Freq: Every day | ORAL | 0 refills | Status: DC

## 2024-06-12 MED ORDER — SEMAGLUTIDE-WEIGHT MANAGEMENT 1.7 MG/0.75ML ~~LOC~~ SOAJ: 1.7000 mg | SUBCUTANEOUS | 0 refills | Status: DC

## 2024-06-12 MED ORDER — SEMAGLUTIDE-WEIGHT MANAGEMENT 0.5 MG/0.5ML ~~LOC~~ SOAJ: 0.5000 mg | SUBCUTANEOUS | 0 refills | Status: DC

## 2024-06-12 MED ORDER — VITAMIN B-1 100 MG PO TABS: 100.0000 mg | ORAL_TABLET | Freq: Every day | ORAL | 1 refills | Status: AC

## 2024-06-12 NOTE — Progress Notes (Unsigned)
 Subjective:  Patient ID: Joseph JINNY Maryruth Mickey., male    DOB: April 10, 1971  Age: 53 y.o. MRN: [id number]  CC: Hypertension and Osteoarthritis   HPI Joseph J Graziani Jr. presents for f/up ---  Discussed the use of AI scribe software for clinical note transcription with the patient, who gave verbal consent to proceed.  History of Present Illness   Joseph J Dakwon Wenberg. is a 53 year old male who presents for follow-up on neuropathy symptoms and medication management.  He experiences neuropathy symptoms in his foot, specifically in the area where he had previous surgery. He describes a 'pins and needles' sensation, which is attributed to nerve involvement. He occasionally takes Tylenol  Arthritis for pain relief.  He is not currently taking Mounjaro  due to insurance coverage issues after a change in employment. He has been approved for Ozempic, which he intends to use for weight reduction. He is also taking meloxicam  for arthritis pain, particularly in his knee, which has been problematic when climbing stairs and has felt unstable at times.  He takes omeprazole  for heartburn and reports no current issues with heartburn or indigestion. He takes a multivitamin and was previously taking vitamin D3 weekly, but he is currently out and needs a refill.  No chest pain, shortness of breath, stomach trouble, or swelling. He mentions starting a walking routine with his girlfriend.       Outpatient Medications Prior to Visit  Medication Sig Dispense Refill   acetaminophen  (TYLENOL ) 650 MG CR tablet Take 1,300 mg by mouth every 8 (eight) hours as needed for pain.     ASPIRIN  81 PO Take 81 mg by mouth daily.     blood glucose meter kit and supplies KIT Use to test blood sugar daily. DX E11.8 1 each 0   glucose blood (COOL BLOOD GLUCOSE TEST STRIPS) test strip Use to test blood sugar daily. DX E11.8 100 each 3   Insulin  Pen Needle (BD PEN NEEDLE MICRO U/F) 32G X 6 MM MISC Use to inject insulin  daily. DX  E11.8 100 each 3   Lancets MISC Use to test blood sugar daily. DX E11.8 100 each 3   meloxicam  (MOBIC ) 15 MG tablet TAKE 1 TABLET (15 MG TOTAL) BY MOUTH DAILY. 30 tablet 3   Multiple Vitamins-Minerals (BARIATRIC MULTIVITAMINS/IRON PO) Take 1 tablet by mouth daily.     omeprazole  (PRILOSEC) 40 MG capsule TAKE 1 CAPSULE (40 MG TOTAL) BY MOUTH DAILY. 90 capsule 1   triamcinolone  cream (KENALOG ) 0.1 % Apply 1 Application topically 2 (two) times daily. 30 g 0   Cholecalciferol  (VITAMIN D3) 1.25 MG (50000 UT) CAPS TAKE 1 TABLET BY MOUTH ONE TIME PER WEEK 12 capsule 0   thiamine  (VITAMIN B-1) 100 MG tablet Take 1 tablet (100 mg total) by mouth daily. 90 tablet 0   cephALEXin  (KEFLEX ) 500 MG capsule Take 1 capsule (500 mg total) by mouth 3 (three) times daily. (Patient not taking: Reported on 05/14/2024) 21 capsule 0   tirzepatide  (MOUNJARO ) 10 MG/0.5ML Pen Inject 10 mg into the skin once a week. 6 mL 0   No facility-administered medications prior to visit.    ROS Review of Systems  Constitutional:  Negative for appetite change, chills, diaphoresis and fatigue.  HENT: Negative.    Eyes: Negative.   Respiratory: Negative.  Negative for cough, chest tightness, shortness of breath and wheezing.   Cardiovascular:  Negative for chest pain, palpitations and leg swelling.  Gastrointestinal: Negative.  Negative for abdominal pain, constipation, diarrhea,  nausea and vomiting.  Genitourinary: Negative.   Musculoskeletal:  Positive for arthralgias. Negative for myalgias.  Skin: Negative.  Negative for color change.  Neurological:  Negative for dizziness and weakness.  Hematological:  Negative for adenopathy. Does not bruise/bleed easily.  Psychiatric/Behavioral: Negative.      Objective:  BP (!) 140/82 (BP Location: Left Arm, Patient Position: Sitting, Cuff Size: Normal)   Pulse 84   Temp 98.3 F (36.8 C) (Oral)   Resp 16   Ht 5' 11 (1.803 m)   Wt 297 lb 3.2 oz (134.8 kg)   SpO2 97%   BMI 41.45  kg/m   BP Readings from Last 3 Encounters:  06/12/24 (!) 140/82  03/19/24 131/84  11/30/23 126/80    Wt Readings from Last 3 Encounters:  06/12/24 297 lb 3.2 oz (134.8 kg)  11/30/23 (!) 300 lb 3.2 oz (136.2 kg)  03/29/23 276 lb (125.2 kg)    Physical Exam Vitals reviewed.  Constitutional:      Appearance: He is obese.  HENT:     Nose: Nose normal.     Mouth/Throat:     Mouth: Mucous membranes are moist.  Eyes:     General: No scleral icterus.    Conjunctiva/sclera: Conjunctivae normal.  Cardiovascular:     Rate and Rhythm: Normal rate and regular rhythm.     Heart sounds: No murmur heard.    No friction rub. No gallop.     Comments: EKG- NSR, 67 bpm No LVH, Q waves, or ST/T wave changes  Unchanged  Pulmonary:     Breath sounds: No stridor. No wheezing, rhonchi or rales.  Abdominal:     General: Abdomen is protuberant. Bowel sounds are normal. There is no distension.     Palpations: Abdomen is soft. There is no hepatomegaly, splenomegaly or mass.     Tenderness: There is no abdominal tenderness. There is no guarding.  Musculoskeletal:        General: Normal range of motion.     Cervical back: Neck supple.     Right lower leg: No edema.     Left lower leg: No edema.  Lymphadenopathy:     Cervical: No cervical adenopathy.  Skin:    General: Skin is warm and dry.  Neurological:     General: No focal deficit present.     Mental Status: He is alert.  Psychiatric:        Mood and Affect: Mood normal.        Behavior: Behavior normal.     Lab Results  Component Value Date   WBC 7.1 06/12/2024   HGB 13.5 06/12/2024   HCT 41.3 06/12/2024   PLT 245.0 06/12/2024   GLUCOSE 72 06/12/2024   CHOL 170 11/30/2023   TRIG 90.0 11/30/2023   HDL 60.80 11/30/2023   LDLCALC 91 11/30/2023   ALT 14 11/30/2023   AST 18 11/30/2023   NA 143 06/12/2024   K 3.7 06/12/2024   CL 106 06/12/2024   CREATININE 0.97 06/12/2024   BUN 18 06/12/2024   CO2 30 06/12/2024   TSH 2.58  11/30/2023   PSA 0.94 11/30/2023   HGBA1C 5.6 06/12/2024   MICROALBUR 1.0 06/12/2024    No results found.  Assessment & Plan:  Type II diabetes mellitus with manifestations (HCC) -     Basic metabolic panel with GFR; Future -     Hemoglobin A1c; Future -     Microalbumin / creatinine urine ratio; Future -  Urinalysis, Routine w reflex microscopic; Future  Vitamin D  deficiency disease -     VITAMIN D  25 Hydroxy (Vit-D Deficiency, Fractures); Future -     Vitamin D3; Take 1 capsule (1.25 mg total) by mouth once a week.  Dispense: 12 capsule; Refill: 0  History of gastric bypass -     Folate; Future -     Vitamin B12; Future -     Vitamin D3; Take 1 capsule (1.25 mg total) by mouth once a week.  Dispense: 12 capsule; Refill: 0  HYPERTENSION, BENIGN ESSENTIAL- He has not achieved his BP goal. EKG is negative for LVH. He is working on his lifestyle modifications. -     Basic metabolic panel with GFR; Future -     CBC with Differential/Platelet; Future -     EKG 12-Lead -     Urinalysis, Routine w reflex microscopic; Future  Thiamine  deficiency neuropathy -     CBC with Differential/Platelet; Future -     Vitamin B-1; Take 1 tablet (100 mg total) by mouth daily.  Dispense: 90 tablet; Refill: 1  Neuropathy involving both lower extremities -     Folate; Future -     Vitamin B12; Future -     Vitamin B-1; Take 1 tablet (100 mg total) by mouth daily.  Dispense: 90 tablet; Refill: 1  Immunization due -     Heplisav-B (HepB-CPG) Vaccine  Morbid obesity with BMI of 40.0-44.9, adult (HCC) -     Semaglutide-Weight Management; Inject 0.25 mg into the skin once a week for 28 days.  Dispense: 2 mL; Refill: 0 -     Semaglutide-Weight Management; Inject 0.5 mg into the skin once a week for 28 days.  Dispense: 2 mL; Refill: 0 -     Semaglutide-Weight Management; Inject 1 mg into the skin once a week for 28 days.  Dispense: 2 mL; Refill: 0 -     Semaglutide-Weight Management; Inject 1.7 mg  into the skin once a week for 28 days.  Dispense: 3 mL; Refill: 0 -     Semaglutide-Weight Management; Inject 2.4 mg into the skin once a week for 28 days.  Dispense: 3 mL; Refill: 0     Follow-up: Return in about 6 months (around 12/13/2024).  Debby Molt, MD

## 2024-06-12 NOTE — Patient Instructions (Signed)
 Hypertension, Adult High blood pressure (hypertension) is when the force of blood pumping through the arteries is too strong. The arteries are the blood vessels that carry blood from the heart throughout the body. Hypertension forces the heart to work harder to pump blood and may cause arteries to become narrow or stiff. Untreated or uncontrolled hypertension can lead to a heart attack, heart failure, a stroke, kidney disease, and other problems. A blood pressure reading consists of a higher number over a lower number. Ideally, your blood pressure should be below 120/80. The first ("top") number is called the systolic pressure. It is a measure of the pressure in your arteries as your heart beats. The second ("bottom") number is called the diastolic pressure. It is a measure of the pressure in your arteries as the heart relaxes. What are the causes? The exact cause of this condition is not known. There are some conditions that result in high blood pressure. What increases the risk? Certain factors may make you more likely to develop high blood pressure. Some of these risk factors are under your control, including: Smoking. Not getting enough exercise or physical activity. Being overweight. Having too much fat, sugar, calories, or salt (sodium) in your diet. Drinking too much alcohol. Other risk factors include: Having a personal history of heart disease, diabetes, high cholesterol, or kidney disease. Stress. Having a family history of high blood pressure and high cholesterol. Having obstructive sleep apnea. Age. The risk increases with age. What are the signs or symptoms? High blood pressure may not cause symptoms. Very high blood pressure (hypertensive crisis) may cause: Headache. Fast or irregular heartbeats (palpitations). Shortness of breath. Nosebleed. Nausea and vomiting. Vision changes. Severe chest pain, dizziness, and seizures. How is this diagnosed? This condition is diagnosed by  measuring your blood pressure while you are seated, with your arm resting on a flat surface, your legs uncrossed, and your feet flat on the floor. The cuff of the blood pressure monitor will be placed directly against the skin of your upper arm at the level of your heart. Blood pressure should be measured at least twice using the same arm. Certain conditions can cause a difference in blood pressure between your right and left arms. If you have a high blood pressure reading during one visit or you have normal blood pressure with other risk factors, you may be asked to: Return on a different day to have your blood pressure checked again. Monitor your blood pressure at home for 1 week or longer. If you are diagnosed with hypertension, you may have other blood or imaging tests to help your health care provider understand your overall risk for other conditions. How is this treated? This condition is treated by making healthy lifestyle changes, such as eating healthy foods, exercising more, and reducing your alcohol intake. You may be referred for counseling on a healthy diet and physical activity. Your health care provider may prescribe medicine if lifestyle changes are not enough to get your blood pressure under control and if: Your systolic blood pressure is above 130. Your diastolic blood pressure is above 80. Your personal target blood pressure may vary depending on your medical conditions, your age, and other factors. Follow these instructions at home: Eating and drinking  Eat a diet that is high in fiber and potassium, and low in sodium, added sugar, and fat. An example of this eating plan is called the DASH diet. DASH stands for Dietary Approaches to Stop Hypertension. To eat this way: Eat  plenty of fresh fruits and vegetables. Try to fill one half of your plate at each meal with fruits and vegetables. Eat whole grains, such as whole-wheat pasta, brown rice, or whole-grain bread. Fill about one  fourth of your plate with whole grains. Eat or drink low-fat dairy products, such as skim milk or low-fat yogurt. Avoid fatty cuts of meat, processed or cured meats, and poultry with skin. Fill about one fourth of your plate with lean proteins, such as fish, chicken without skin, beans, eggs, or tofu. Avoid pre-made and processed foods. These tend to be higher in sodium, added sugar, and fat. Reduce your daily sodium intake. Many people with hypertension should eat less than 1,500 mg of sodium a day. Do not drink alcohol if: Your health care provider tells you not to drink. You are pregnant, may be pregnant, or are planning to become pregnant. If you drink alcohol: Limit how much you have to: 0-1 drink a day for women. 0-2 drinks a day for men. Know how much alcohol is in your drink. In the [place] one drink equals one 12 oz bottle of beer (355 mL), one 5 oz glass of wine (148 mL), or one 1 oz glass of hard liquor (44 mL). Lifestyle  Work with your health care provider to maintain a healthy body weight or to lose weight. Ask what an ideal weight is for you. Get at least 30 minutes of exercise that causes your heart to beat faster (aerobic exercise) most days of the week. Activities may include walking, swimming, or biking. Include exercise to strengthen your muscles (resistance exercise), such as Pilates or lifting weights, as part of your weekly exercise routine. Try to do these types of exercises for 30 minutes at least 3 days a week. Do not use any products that contain nicotine or tobacco. These products include cigarettes, chewing tobacco, and vaping devices, such as e-cigarettes. If you need help quitting, ask your health care provider. Monitor your blood pressure at home as told by your health care provider. Keep all follow-up visits. This is important. Medicines Take over-the-counter and prescription medicines only as told by your health care provider. Follow directions carefully. Blood  pressure medicines must be taken as prescribed. Do not skip doses of blood pressure medicine. Doing this puts you at risk for problems and can make the medicine less effective. Ask your health care provider about side effects or reactions to medicines that you should watch for. Contact a health care provider if you: Think you are having a reaction to a medicine you are taking. Have headaches that keep coming back (recurring). Feel dizzy. Have swelling in your ankles. Have trouble with your vision. Get help right away if you: Develop a severe headache or confusion. Have unusual weakness or numbness. Feel faint. Have severe pain in your chest or abdomen. Vomit repeatedly. Have trouble breathing. These symptoms may be an emergency. Get help right away. Call 911. Do not wait to see if the symptoms will go away. Do not drive yourself to the hospital. Summary Hypertension is when the force of blood pumping through your arteries is too strong. If this condition is not controlled, it may put you at risk for serious complications. Your personal target blood pressure may vary depending on your medical conditions, your age, and other factors. For most people, a normal blood pressure is less than 120/80. Hypertension is treated with lifestyle changes, medicines, or a combination of both. Lifestyle changes include losing weight, eating a healthy,  low-sodium diet, exercising more, and limiting alcohol. This information is not intended to replace advice given to you by your health care provider. Make sure you discuss any questions you have with your health care provider. Document Revised: 10/06/2021 Document Reviewed: 10/06/2021 Elsevier Patient Education  2024 [employer].

## 2024-06-13 ENCOUNTER — Telehealth: Payer: Self-pay

## 2024-06-13 ENCOUNTER — Other Ambulatory Visit (HOSPITAL_COMMUNITY): Payer: Self-pay

## 2024-06-13 ENCOUNTER — Ambulatory Visit: Payer: Self-pay | Admitting: Internal Medicine

## 2024-06-13 MED ORDER — VITAMIN D3 1.25 MG (50000 UT) PO CAPS: 1.0000 | ORAL_CAPSULE | ORAL | 0 refills | Status: AC

## 2024-06-13 NOTE — Telephone Encounter (Signed)
 Pharmacy Patient Advocate Encounter   Received notification from CoverMyMeds that prior authorization for Wegovy 0.25 is required/requested.   Insurance verification completed.   The patient is insured through [facility] . Patient has diabetes and chart notes show patient is on Ozempic. Patient already has approved PA for Ozempic, see encounter 05/21/24.   Per test claim: ran test bill for Ozempic, co-pay for 28 day supply is $4.00

## 2024-06-14 NOTE — Telephone Encounter (Signed)
 Patient has been made aware.

## 2024-06-18 ENCOUNTER — Telehealth: Payer: Self-pay

## 2024-06-18 NOTE — Telephone Encounter (Signed)
 Copied from CRM [id number]. Topic: Clinical - Prescription Issue >> Jun 18, 2024  1:07 PM Berneda FALCON wrote: Reason for CRM: Pt states that he called the pharmacy states they did not get the prescription for Semaglutide -Weight Management 0.25 MG/0.5ML SOAJ, but I see that it was called in on 06/12/2024. Pt would like to pick this up today. Is there any way we can call the pharmacy and get this worked out, please?  Pt callback is [phone], please call back to discuss.

## 2024-06-18 NOTE — Telephone Encounter (Signed)
 Patient would like to know why Wegovy  is being sent in. He says he has been requesting Ozempic . Best callback is [phone].

## 2024-06-18 NOTE — Telephone Encounter (Signed)
 Copied from CRM [id number]. Topic: Clinical - Medication Prior Auth >> Jun 14, 2024  4:51 PM Zebedee SAUNDERS wrote: Reason for CRM: Pt stated Semaglutide -Weight Management 0.25 MG/0.5ML SOAJ needs a prior authorization. Please call pt at [phone] when acquired.

## 2024-06-19 NOTE — Telephone Encounter (Signed)
 Copied from CRM [id number]. Topic: Clinical - Medication Question >> Jun 18, 2024  4:15 PM Joseph Wood wrote: Reason for CRM: Patient is supposed to be getting Ozempic  but wegovy  is being sent over- [phone]

## 2024-06-19 NOTE — Telephone Encounter (Signed)
 Copied from CRM [id number]. Topic: Clinical - Prescription Issue >> Jun 19, 2024 10:52 AM Macario HERO wrote: Reason for CRM: Patient is calling back very frustrated that he has not received his Ozempic  and the pharmacy stated that they have the prescription for Wegovy  only. Patient is requesting a we send a prescription to the pharmacy for his Ozempic  because that's what his insurance will cover. Patient is requesting a call back once it's completed.

## 2024-06-20 NOTE — Telephone Encounter (Signed)
 Any updates on this encounter? Pt called again about this concern. Please advise, Thanks

## 2024-06-22 ENCOUNTER — Other Ambulatory Visit: Payer: Self-pay | Admitting: Internal Medicine

## 2024-06-22 DIAGNOSIS — E118 Type 2 diabetes mellitus with unspecified complications: Secondary | ICD-10-CM

## 2024-06-22 DIAGNOSIS — G4733 Obstructive sleep apnea (adult) (pediatric): Secondary | ICD-10-CM

## 2024-06-22 MED ORDER — OZEMPIC (0.25 OR 0.5 MG/DOSE) 2 MG/3ML ~~LOC~~ SOPN: 0.2500 mg | PEN_INJECTOR | SUBCUTANEOUS | 0 refills | Status: DC

## 2024-06-22 MED ORDER — INSULIN PEN NEEDLE 32G X 6 MM MISC: 1.0000 | 1 refills | Status: AC

## 2024-06-22 NOTE — Telephone Encounter (Signed)
 Aptient states his insurance will not cover the Wegovy  can you please cancel those prescriptions and send in ozempic  ?

## 2024-07-13 ENCOUNTER — Ambulatory Visit (INDEPENDENT_AMBULATORY_CARE_PROVIDER_SITE_OTHER): Admitting: Radiology

## 2024-07-13 DIAGNOSIS — Z23 Encounter for immunization: Secondary | ICD-10-CM

## 2024-07-13 NOTE — Progress Notes (Signed)
 Patient here for 2nd Hep B injection. Patient tolerated well with no complications. Injection was given in left deltoid. He had no further questions

## 2024-08-01 ENCOUNTER — Telehealth: Payer: Self-pay

## 2024-08-01 NOTE — Telephone Encounter (Signed)
 Copied from CRM #[id number]. Topic: Clinical - Medication Question >> Jul 31, 2024  4:21 PM Chiquita SQUIBB wrote: Reason for CRM: Patient is calling in asking when his Semaglutide ,0.25 or 0.5MG /DOS, (OZEMPIC , 0.25 OR 0.5 MG/DOSE,) 2 MG/3ML SOPN, should be upped to the next dose and when his next refill is. Please advise patient

## 2024-08-02 ENCOUNTER — Other Ambulatory Visit: Payer: Self-pay | Admitting: Internal Medicine

## 2024-08-02 DIAGNOSIS — E118 Type 2 diabetes mellitus with unspecified complications: Secondary | ICD-10-CM

## 2024-08-02 MED ORDER — SEMAGLUTIDE (1 MG/DOSE) 4 MG/3ML ~~LOC~~ SOPN: 1.0000 mg | PEN_INJECTOR | SUBCUTANEOUS | 0 refills | Status: DC

## 2024-08-02 NOTE — Telephone Encounter (Signed)
**Note De-identified  [place] Obfuscation** Please advise 

## 2024-08-11 DIAGNOSIS — M791 Myalgia, unspecified site: Secondary | ICD-10-CM | POA: Diagnosis not present

## 2024-08-11 DIAGNOSIS — U071 COVID-19: Secondary | ICD-10-CM | POA: Diagnosis not present

## 2024-08-11 DIAGNOSIS — R0981 Nasal congestion: Secondary | ICD-10-CM | POA: Diagnosis not present

## 2024-08-11 DIAGNOSIS — R051 Acute cough: Secondary | ICD-10-CM | POA: Diagnosis not present

## 2024-08-30 ENCOUNTER — Other Ambulatory Visit: Payer: Self-pay | Admitting: Podiatry

## 2024-10-15 ENCOUNTER — Other Ambulatory Visit: Payer: Self-pay | Admitting: Internal Medicine

## 2024-10-15 DIAGNOSIS — E118 Type 2 diabetes mellitus with unspecified complications: Secondary | ICD-10-CM

## 2024-11-16 ENCOUNTER — Ambulatory Visit

## 2024-11-16 ENCOUNTER — Encounter: Payer: Self-pay | Admitting: Internal Medicine

## 2024-11-16 ENCOUNTER — Ambulatory Visit: Admitting: Internal Medicine

## 2024-11-16 VITALS — BP 134/76 | HR 71 | Temp 98.1°F | Ht 71.0 in | Wt 311.0 lb

## 2024-11-16 DIAGNOSIS — M79642 Pain in left hand: Secondary | ICD-10-CM

## 2024-11-16 DIAGNOSIS — M2550 Pain in unspecified joint: Secondary | ICD-10-CM

## 2024-11-16 DIAGNOSIS — M1711 Unilateral primary osteoarthritis, right knee: Secondary | ICD-10-CM | POA: Diagnosis not present

## 2024-11-16 DIAGNOSIS — M79641 Pain in right hand: Secondary | ICD-10-CM | POA: Diagnosis not present

## 2024-11-16 DIAGNOSIS — M25561 Pain in right knee: Secondary | ICD-10-CM

## 2024-11-16 LAB — SEDIMENTATION RATE: Sed Rate: 18 mm/h (ref 0–20)

## 2024-11-16 LAB — COMPREHENSIVE METABOLIC PANEL WITH GFR
ALT: 19 U/L (ref 0–53)
AST: 20 U/L (ref 0–37)
Albumin: 4.5 g/dL (ref 3.5–5.2)
Alkaline Phosphatase: 64 U/L (ref 39–117)
BUN: 23 mg/dL (ref 6–23)
CO2: 32 meq/L (ref 19–32)
Calcium: 8.9 mg/dL (ref 8.4–10.5)
Chloride: 101 meq/L (ref 96–112)
Creatinine, Ser: 0.96 mg/dL (ref 0.40–1.50)
GFR: 90.3 mL/min (ref >60.00)
Glucose, Bld: 76 mg/dL (ref 70–99)
Potassium: 4.1 meq/L (ref 3.5–5.1)
Sodium: 139 meq/L (ref 135–145)
Total Bilirubin: 0.6 mg/dL (ref 0.2–1.2)
Total Protein: 7.1 g/dL (ref 6.0–8.3)

## 2024-11-16 LAB — CBC WITH DIFFERENTIAL/PLATELET
Basophils Absolute: 0 K/uL (ref 0.0–0.1)
Basophils Relative: 0.6 % (ref 0.0–3.0)
Eosinophils Absolute: 0.1 K/uL (ref 0.0–0.7)
Eosinophils Relative: 1.5 % (ref 0.0–5.0)
HCT: 41.5 % (ref 39.0–52.0)
Hemoglobin: 13.3 g/dL (ref 13.0–17.0)
Lymphocytes Relative: 27.3 % (ref 12.0–46.0)
Lymphs Abs: 2 K/uL (ref 0.7–4.0)
MCHC: 32 g/dL (ref 30.0–36.0)
MCV: 85.7 fl (ref 78.0–100.0)
Monocytes Absolute: 0.7 K/uL (ref 0.1–1.0)
Monocytes Relative: 10 % (ref 3.0–12.0)
Neutro Abs: 4.4 K/uL (ref 1.4–7.7)
Neutrophils Relative %: 60.6 % (ref 43.0–77.0)
Platelets: 260 K/uL (ref 150.0–400.0)
RBC: 4.85 Mil/uL (ref 4.22–5.81)
RDW: 16.9 % — ABNORMAL HIGH (ref 11.5–15.5)
WBC: 7.3 K/uL (ref 4.0–10.5)

## 2024-11-16 LAB — C-REACTIVE PROTEIN: CRP: <0.5 mg/dL (ref 0.5–20.0)

## 2024-11-16 NOTE — Progress Notes (Signed)
 Subjective:    Patient ID: Joseph Wood., male    DOB: 1971/09/16, 53 y.o.   MRN: [id number]      HPI Joseph Wood is here for  Chief Complaint  Patient presents with   Hand Pain    Bilateral hand pain   Shoulder Pain    Bilateral shoulder pain     Discussed the use of AI scribe software for clinical note transcription with the patient, who gave verbal consent to proceed.  History of Present Illness Joseph J Khaleef Ruby. is a 53 year old male who presents with worsening joint pain in his hands, shoulders, and knees.  He experiences significant sharp pain in his hands, particularly severe in the knuckles and the top of the hand, which has been worsening over time. He reports a family history of rheumatoid arthritis affecting his mother and sister.  He has a history of shoulder issues, including a torn bicep and previous surgery. Despite the surgery, he continues to experience pain and popping in both shoulders, which limits his ability to perform tasks such as weed eating and chainsaw running at work.  His right knee, previously injured with torn ligaments and treated with screws, is also painful. It grinds and pops frequently, causing difficulty with stairs and carrying heavy objects.  He has a fused left foot, which limits his ability to stand for long periods and contributes to his part-time work status. He has experienced falls at work due to balance issues.  He takes meloxicam  and Tylenol  Arthritis for pain management, with variable effectiveness. He also wears a copper bracelet in hopes of alleviating symptoms. He has been informed of having two collapsed vertebrae in his lower back with associated arthritis, contributing to his overall pain.   His pain level has increased and is not currently well-controlled.   Medications and allergies reviewed with patient and updated if appropriate.  Current Outpatient Medications on File Prior to Visit  Medication Sig  Dispense Refill   acetaminophen  (TYLENOL ) 650 MG CR tablet Take 1,300 mg by mouth every 8 (eight) hours as needed for pain.     ASPIRIN  81 PO Take 81 mg by mouth daily.     blood glucose meter kit and supplies KIT Use to test blood sugar daily. DX E11.8 1 each 0   Cholecalciferol  (VITAMIN D3) 1.25 MG (50000 UT) CAPS Take 1 capsule (1.25 mg total) by mouth once a week. 12 capsule 0   glucose blood (COOL BLOOD GLUCOSE TEST STRIPS) test strip Use to test blood sugar daily. DX E11.8 100 each 3   Insulin  Pen Needle 32G X 6 MM MISC 1 Act by Does not apply route once a week. 30 each 1   Lancets MISC Use to test blood sugar daily. DX E11.8 100 each 3   meloxicam  (MOBIC ) 15 MG tablet TAKE 1 TABLET (15 MG TOTAL) BY MOUTH DAILY. 30 tablet 3   Multiple Vitamins-Minerals (BARIATRIC MULTIVITAMINS/IRON PO) Take 1 tablet by mouth daily.     omeprazole  (PRILOSEC) 40 MG capsule TAKE 1 CAPSULE (40 MG TOTAL) BY MOUTH DAILY. 90 capsule 1   Semaglutide , 1 MG/DOSE, (OZEMPIC , 1 MG/DOSE,) 4 MG/3ML SOPN INJECT 1 MG ONCE A WEEK AS DIRECTED 9 mL 1   thiamine  (VITAMIN B-1) 100 MG tablet Take 1 tablet (100 mg total) by mouth daily. 90 tablet 1   triamcinolone  cream (KENALOG ) 0.1 % Apply 1 Application topically 2 (two) times daily. 30 g 0   No current facility-administered medications  on file prior to visit.    Review of Systems     Objective:   Vitals:   11/16/24 1554  BP: 134/76  Pulse: 71  Temp: 98.1 F (36.7 C)  SpO2: 97%   BP Readings from Last 3 Encounters:  11/16/24 134/76  06/12/24 (!) 140/82  03/19/24 131/84   Wt Readings from Last 3 Encounters:  11/16/24 (!) 311 lb (141.1 kg)  06/12/24 297 lb 3.2 oz (134.8 kg)  11/30/23 (!) 300 lb 3.2 oz (136.2 kg)   Body mass index is 43.38 kg/m.    Physical Exam Constitutional:      General: He is not in acute distress.    Appearance: Normal appearance. He is not ill-appearing.  HENT:     Head: Normocephalic and atraumatic.  Musculoskeletal:         General: Swelling (Slight swelling in PIP joints and hands) present. No deformity.     Right lower leg: No edema.     Left lower leg: No edema.  Skin:    General: Skin is warm and dry.     Findings: No erythema or rash.  Neurological:     Mental Status: He is alert.            Assessment & Plan:       Assessment and Plan Assessment & Plan Chronic joint pain involving hands, shoulders, knees, and left foot with suspected autoimmune and osteoarthritis etiologies Chronic joint pain in hands, shoulders, knees, and left foot, worsening over time. Differential includes autoimmune arthritis and osteoarthritis, with possible post-traumatic arthritis in knees. Current medications include meloxicam  and Tylenol  arthritis, with variable effectiveness. - Ordered autoimmune blood work to evaluate for rheumatoid arthritis and other autoimmune conditions-ANA, ESR, CRP, CCP, RF, CBC, CMP. - Ordered x-rays of hands and right knee to assess for osteoarthritis and post-traumatic changes. - Referred to rheumatology for further evaluation of potential autoimmune arthritis. - Advised to discuss with orthopedics regarding shoulder and knee limitations for potential disability documentation.

## 2024-11-16 NOTE — Patient Instructions (Addendum)
      Blood work was ordered.   X-rays ordered.      Medications changes include :   None    A referral was ordered Rheumatology and someone will call you to schedule an appointment.

## 2024-11-17 ENCOUNTER — Ambulatory Visit: Payer: Self-pay | Admitting: Internal Medicine

## 2024-11-19 LAB — CYCLIC CITRUL PEPTIDE ANTIBODY, IGG: Cyclic Citrullin Peptide Ab: <16 U

## 2024-11-19 LAB — RHEUMATOID FACTOR: Rheumatoid fact SerPl-aCnc: <10 [IU]/mL (ref <14)

## 2024-11-19 LAB — ANA: Anti Nuclear Antibody (ANA): NEGATIVE

## 2024-11-21 ENCOUNTER — Ambulatory Visit: Admitting: Podiatry

## 2024-11-21 ENCOUNTER — Encounter: Payer: Self-pay | Admitting: Podiatry

## 2024-11-21 VITALS — Ht 71.0 in | Wt 311.0 lb

## 2024-11-21 DIAGNOSIS — M216X2 Other acquired deformities of left foot: Secondary | ICD-10-CM

## 2024-11-21 DIAGNOSIS — M216X1 Other acquired deformities of right foot: Secondary | ICD-10-CM | POA: Diagnosis not present

## 2024-11-21 DIAGNOSIS — M19072 Primary osteoarthritis, left ankle and foot: Secondary | ICD-10-CM | POA: Diagnosis not present

## 2024-11-21 MED ORDER — BETAMETHASONE SOD PHOS & ACET 6 (3-3) MG/ML IJ SUSP
3.0000 mg | Freq: Once | INTRAMUSCULAR | Status: AC
  Administered 2024-11-21: 3 mg via INTRA_ARTICULAR

## 2024-11-21 NOTE — Progress Notes (Signed)
 Chief Complaint  Patient presents with   Foot Pain    Pt is here due to left foot pain, he states he has been here before for the same issue, states the foot has been bothering him for a while, states he can not walk on it for long periods of time, he takes OTC and prescribed meds for the pain with no relief.     Subjective:  53 y.o. male presenting today for evaluation of numbness with pins-and-needles and burning sensation to the medial aspect of the left foot.  Ongoing for about 1-2 months.  No history of injury.  History of triple arthrodesis to the left foot  Past Medical History:  Diagnosis Date   Arthritis    GERD (gastroesophageal reflux disease)    Hypertension    Obesity    OSA on CPAP    Type 2 diabetes mellitus (HCC)    followed by pcp   Wears partial dentures    lower    Past Surgical History:  Procedure Laterality Date   COLONOSCOPY WITH PROPOFOL   11/06/2012   Procedure: COLONOSCOPY WITH PROPOFOL ;  Surgeon: Gordy CHRISTELLA Starch, MD;  Location: [facility];  Service: Gastroenterology;  Laterality: N/A;  Andrea/   COLONOSCOPY WITH PROPOFOL  N/A 04/22/2022   Procedure: COLONOSCOPY WITH PROPOFOL ;  Surgeon: Starch Gordy CHRISTELLA, MD;  Location: [facility];  Service: Gastroenterology;  Laterality: N/A;   GASTRIC ROUX-EN-Y N/A 05/02/2018   Procedure: LAPAROSCOPIC ROUX-EN-Y GASTRIC BYPASS WITH UPPER ENDOSCOPY;  Surgeon: Mikell Katz, MD;  Location: [facility];  Service: General;  Laterality: N/A;   KNEE ARTHROSCOPY W/ ACL RECONSTRUCTION Right 11-24-2009   dr dean    Allergies  Allergen Reactions   Benazepril  Hcl Cough    Severe cough   Tramadol  Other (See Comments)    Severe dizziness    Objective / Physical Exam:  General:  The patient is alert and oriented x3 in no acute distress. Dermatology: Skin is warm, dry and supple bilateral lower extremities. Negative for open lesions or macerations. Vascular: Palpable pedal pulses bilaterally. No edema or erythema noted. Capillary  refill within normal limits. Neurological: Grossly intact via light touch.  Positive Tinel sign at the medial aspect of the foot along the previous triple arthrodesis incision site Musculoskeletal Exam: No tenderness throughout palpation.  Stability of the foot noted Radiographic Exam LT foot and ankle 05/14/2024:  Normal osseous mineralization.  Triple arthrodesis with osseous union of the arthrodesis sites.  Hardware is intact and stable.  There does appear to be some osteophyte formation and periarticular spurring to the anterior portion of the ankle joint which correlates with the patient's pain clinically.  Assessment: 1.  DJD left ankle; medial aspect 2.  H/o triple arthrodesis left   -Patient evaluated.  Unfortunately the patient has chronic condition of foot pathology.  He has undergone surgery and continues to have ankle pain and pain throughout different parts of his lower and upper extremities.  Likely will have chronic conditions intermittently associated to the foot.  Currently considering disability and I do believe this is appropriate given the multiple comorbidities and pathologies that he is experiencing currently including left shoulder injury, right knee pathology, DDD, and chronic left foot pain -Injection of 0.5 cc Celestone  Soluspan injected along the medial aspect of the ankle -Continue wearing good supportive shoes and sneakers.  Advised against going barefoot - Continue meloxicam  15 mg daily - I do believe the patient would benefit from custom molded orthotics to support the medial longitudinal  arch of the foot.  This should help potentially alleviate a lot of his foot pain as well as knee and back pain as well potentially. -Today the patient was molded for custom orthotics -Return to clinic orthotics pickup  Thresa EMERSON Sar, DPM [facility]  Dr. Thresa EMERSON Sar, DPM    [address] [place], [place] [address]                 Office [phone]  Fax [phone]

## 2024-11-21 NOTE — Progress Notes (Signed)
 ORTHOTIC SCAN/ EVALUATION  Patient presented for evaluation with Dr. Janit-  while at this appointment I performed a scan for custom molded foot orthotics.  Patient will benefit from custom foot orthotics to provide total contact to bilateral medial longitudinal arches to help balance and distribute body weight more evenly.  Thus reducing plantar pressure and pain.   Orthotic will encourage forefoot and rearfoot alignment.    Patient was scanned today with OHI scanner.    Orthotics are ordered.  Signature obtained for notification of pricing/ fees for the device.  When the orthotic is ready for pick up, will call to make an appointment for a fitting.

## 2024-11-26 DIAGNOSIS — M7582 Other shoulder lesions, left shoulder: Secondary | ICD-10-CM | POA: Diagnosis not present

## 2024-11-26 DIAGNOSIS — M7581 Other shoulder lesions, right shoulder: Secondary | ICD-10-CM | POA: Diagnosis not present

## 2024-12-17 ENCOUNTER — Ambulatory Visit (INDEPENDENT_AMBULATORY_CARE_PROVIDER_SITE_OTHER)

## 2024-12-17 ENCOUNTER — Encounter: Payer: Self-pay | Admitting: Internal Medicine

## 2024-12-17 ENCOUNTER — Ambulatory Visit: Admitting: Internal Medicine

## 2024-12-17 ENCOUNTER — Ambulatory Visit: Payer: Self-pay | Admitting: Internal Medicine

## 2024-12-17 VITALS — BP 130/84 | HR 67 | Temp 98.4°F | Ht 71.0 in | Wt 314.4 lb

## 2024-12-17 DIAGNOSIS — R052 Subacute cough: Secondary | ICD-10-CM | POA: Diagnosis not present

## 2024-12-17 DIAGNOSIS — Z125 Encounter for screening for malignant neoplasm of prostate: Secondary | ICD-10-CM | POA: Diagnosis not present

## 2024-12-17 DIAGNOSIS — L03311 Cellulitis of abdominal wall: Secondary | ICD-10-CM | POA: Diagnosis not present

## 2024-12-17 DIAGNOSIS — E785 Hyperlipidemia, unspecified: Secondary | ICD-10-CM

## 2024-12-17 DIAGNOSIS — J22 Unspecified acute lower respiratory infection: Secondary | ICD-10-CM | POA: Diagnosis not present

## 2024-12-17 DIAGNOSIS — E118 Type 2 diabetes mellitus with unspecified complications: Secondary | ICD-10-CM | POA: Diagnosis not present

## 2024-12-17 DIAGNOSIS — I491 Atrial premature depolarization: Secondary | ICD-10-CM | POA: Diagnosis not present

## 2024-12-17 DIAGNOSIS — I10 Essential (primary) hypertension: Secondary | ICD-10-CM | POA: Diagnosis not present

## 2024-12-17 DIAGNOSIS — Z23 Encounter for immunization: Secondary | ICD-10-CM | POA: Insufficient documentation

## 2024-12-17 LAB — LIPID PANEL
Cholesterol: 180 mg/dL (ref 28–200)
HDL: 58.8 mg/dL
LDL Cholesterol: 102 mg/dL — ABNORMAL HIGH (ref 10–99)
NonHDL: 121.36
Total CHOL/HDL Ratio: 3
Triglycerides: 95 mg/dL (ref 10.0–149.0)
VLDL: 19 mg/dL (ref 0.0–40.0)

## 2024-12-17 LAB — HEMOGLOBIN A1C: Hgb A1c MFr Bld: 5.9 % (ref 4.6–6.5)

## 2024-12-17 LAB — PSA: PSA: 0.76 ng/mL (ref 0.10–4.00)

## 2024-12-17 LAB — TSH: TSH: 1.55 u[IU]/mL (ref 0.35–5.50)

## 2024-12-17 MED ORDER — COVID-19 MRNA VAC-TRIS(PFIZER) 30 MCG/0.3ML IM SUSY: 0.3000 mL | PREFILLED_SYRINGE | Freq: Once | INTRAMUSCULAR | 0 refills | Status: AC

## 2024-12-17 MED ORDER — SEMAGLUTIDE (2 MG/DOSE) 8 MG/3ML ~~LOC~~ SOPN: 2.0000 mg | PEN_INJECTOR | SUBCUTANEOUS | 1 refills | Status: AC

## 2024-12-17 MED ORDER — SULFAMETHOXAZOLE-TRIMETHOPRIM 800-160 MG PO TABS: 1.0000 | ORAL_TABLET | Freq: Two times a day (BID) | ORAL | 0 refills | Status: AC

## 2024-12-17 NOTE — Progress Notes (Signed)
 "     Subjective:  Patient ID: Joseph Wood., male    DOB: Nov 07, 1971  Age: 54 y.o. MRN: [id number]  CC: Diabetes (6 month follow up )   HPI Joseph J Behrendt Jr. presents for f/up -----  Discussed the use of AI scribe software for clinical note transcription with the patient, who gave verbal consent to proceed.  History of Present Illness Joseph J Lavone Weisel. is a 54 year old male who presents with a two-week history of cough and congestion.  He has experienced a two-week history of cough, congestion, sore throat, and phlegm production, with the phlegm being sometimes clear and sometimes yellowish. No fever, chills, or hemoptysis. He experiences slight wheezing and chest tightness, especially when coughing, and describes a 'crackling' sensation in his voice and chest but denies pain. He has been taking Theraflu and Nyquil for symptom relief.  He reports chronic joint pain, particularly in his hands, knuckles, and shoulders, with sharp pain in the top of his hand when lifting heavy objects and soreness in his knuckles. Meloxicam  does not provide significant relief. Occasional swelling in his joints is noted, but no redness. He received cortisone shots in both shoulders a couple of weeks ago and previously had one in his foot, which is fused. He reports having been told he has arthritis in his back and suspects arthritis in his foot and knee, and mentions a family history of arthritis. Previous blood work and x-rays have been done, but he is unsure of the results.  He mentions skin issues with bumps that sometimes fill with blood and pus, which then clear up. A spot last week mostly cleared up but left a hole. He experiences these bumps intermittently.     Outpatient Medications Prior to Visit  Medication Sig Dispense Refill   acetaminophen  (TYLENOL ) 650 MG CR tablet Take 1,300 mg by mouth every 8 (eight) hours as needed for pain.     ASPIRIN  81 PO Take 81 mg by mouth daily.      blood glucose meter kit and supplies KIT Use to test blood sugar daily. DX E11.8 1 each 0   Cholecalciferol  (VITAMIN D3) 1.25 MG (50000 UT) CAPS Take 1 capsule (1.25 mg total) by mouth once a week. 12 capsule 0   glucose blood (COOL BLOOD GLUCOSE TEST STRIPS) test strip Use to test blood sugar daily. DX E11.8 100 each 3   Insulin  Pen Needle 32G X 6 MM MISC 1 Act by Does not apply route once a week. 30 each 1   Lancets MISC Use to test blood sugar daily. DX E11.8 100 each 3   meloxicam  (MOBIC ) 15 MG tablet TAKE 1 TABLET (15 MG TOTAL) BY MOUTH DAILY. 30 tablet 3   Multiple Vitamins-Minerals (BARIATRIC MULTIVITAMINS/IRON PO) Take 1 tablet by mouth daily.     omeprazole  (PRILOSEC) 40 MG capsule TAKE 1 CAPSULE (40 MG TOTAL) BY MOUTH DAILY. 90 capsule 1   thiamine  (VITAMIN B-1) 100 MG tablet Take 1 tablet (100 mg total) by mouth daily. 90 tablet 1   triamcinolone  cream (KENALOG ) 0.1 % Apply 1 Application topically 2 (two) times daily. 30 g 0   Semaglutide , 1 MG/DOSE, (OZEMPIC , 1 MG/DOSE,) 4 MG/3ML SOPN INJECT 1 MG ONCE A WEEK AS DIRECTED 9 mL 1   No facility-administered medications prior to visit.    ROS Review of Systems  Constitutional:  Negative for appetite change, chills, diaphoresis, fatigue and fever.  HENT: Negative.  Negative for sore throat and  trouble swallowing.   Respiratory:  Positive for cough and wheezing. Negative for chest tightness and shortness of breath.   Cardiovascular:  Negative for chest pain, palpitations and leg swelling.  Gastrointestinal: Negative.  Negative for abdominal pain, constipation, diarrhea, nausea and vomiting.  Genitourinary: Negative.  Negative for difficulty urinating.  Musculoskeletal:  Positive for arthralgias and back pain. Negative for joint swelling and myalgias.  Skin: Negative.   Neurological: Negative.  Negative for dizziness and weakness.  Hematological:  Negative for adenopathy. Does not bruise/bleed easily.    Objective:  BP 130/84 (BP  Location: Left Arm, Patient Position: Sitting, Cuff Size: Normal)   Pulse 67   Temp 98.4 F (36.9 C) (Oral)   Ht 5' 11 (1.803 m)   Wt (!) 314 lb 6.4 oz (142.6 kg)   SpO2 96%   BMI 43.85 kg/m   BP Readings from Last 3 Encounters:  12/17/24 130/84  11/16/24 134/76  06/12/24 (!) 140/82    Wt Readings from Last 3 Encounters:  12/17/24 (!) 314 lb 6.4 oz (142.6 kg)  11/21/24 (!) 311 lb (141.1 kg)  11/16/24 (!) 311 lb (141.1 kg)    Physical Exam Vitals reviewed.  Constitutional:      General: He is not in acute distress.    Appearance: He is not ill-appearing or toxic-appearing.  HENT:     Nose: Nose normal.     Mouth/Throat:     Mouth: Mucous membranes are moist.  Eyes:     General: No scleral icterus.    Conjunctiva/sclera: Conjunctivae normal.  Cardiovascular:     Rate and Rhythm: Normal rate. Occasional Extrasystoles are present.    Heart sounds: Normal heart sounds, S1 normal and S2 normal. No murmur heard.    Comments: EKG--- SR with 1st degree AV block with PAC's (new), 74 bpm No LVH or Q waves  Pulmonary:     Breath sounds: No stridor. No wheezing, rhonchi or rales.  Abdominal:     General: Abdomen is protuberant. Bowel sounds are normal. There is no distension.     Palpations: Abdomen is soft. There is no hepatomegaly, splenomegaly or mass.     Tenderness: There is no abdominal tenderness. There is no guarding.  Musculoskeletal:        General: No swelling or tenderness. Normal range of motion.     Cervical back: Neck supple.     Right lower leg: No edema.     Left lower leg: No edema.  Skin:    General: Skin is warm and dry.     Coloration: Skin is not jaundiced.  Neurological:     General: No focal deficit present.     Mental Status: He is alert.  Psychiatric:        Mood and Affect: Mood normal.        Behavior: Behavior normal.     Lab Results  Component Value Date   WBC 7.3 11/16/2024   HGB 13.3 11/16/2024   HCT 41.5 11/16/2024   PLT 260.0  11/16/2024   GLUCOSE 76 11/16/2024   CHOL 180 12/17/2024   TRIG 95.0 12/17/2024   HDL 58.80 12/17/2024   LDLCALC 102 (H) 12/17/2024   ALT 19 11/16/2024   AST 20 11/16/2024   NA 139 11/16/2024   K 4.1 11/16/2024   CL 101 11/16/2024   CREATININE 0.96 11/16/2024   BUN 23 11/16/2024   CO2 32 11/16/2024   TSH 1.55 12/17/2024   PSA 0.76 12/17/2024   HGBA1C 5.9 12/17/2024  MICROALBUR 1.0 06/12/2024    DG Chest 2 View Result Date: 12/17/2024 CLINICAL DATA:  Cough for 2 weeks EXAM: DG CHEST 2V COMPARISON:  01/23/2018 FINDINGS: The heart size and mediastinal contours are within normal limits. Both lungs are clear. Multilevel degenerative changes of the spine. IMPRESSION: No active cardiopulmonary disease. Electronically Signed   By: Luke Bun M.D.   On: 12/17/2024 15:38    The 10-year ASCVD risk score (Arnett DK, et al., 2019) is: 6.8%   Values used to calculate the score:     Age: 13 years     Clinically relevant sex: Male     Is Non-Hispanic African American: No     Diabetic: Yes     Tobacco smoker: No     Systolic Blood Pressure: 130 mmHg     Is BP treated: No     HDL Cholesterol: 58.8 mg/dL     Total Cholesterol: 180 mg/dL   Assessment & Plan:  Type II diabetes mellitus with manifestations (HCC)- Blood sugar is well controlled. -     Hemoglobin A1c; Future -     COVID-19 mRNA Vac-TriS(Pfizer); Inject 0.3 mLs into the muscle once for 1 dose.  Dispense: 0.3 mL; Refill: 0 -     Ambulatory referral to Ophthalmology -     Semaglutide  (2 MG/DOSE); Inject 2 mg as directed once a week.  Dispense: 9 mL; Refill: 1  Hyperlipidemia with target LDL less than 100- Statin is not indicated. -     Lipid panel; Future -     TSH; Future  Prostate cancer screening -     PSA; Future  Subacute cough -     DG Chest 2 View; Future  Need for immunization against influenza -     Flu vaccine trivalent PF, 6mos and older(Flulaval,Afluria,Fluarix,Fluzone)  Essential hypertension, benign- BP  is well controlled. -     EKG 12-Lead  PAC (premature atrial contraction)- He is asx.  LRTI (lower respiratory tract infection) -     Sulfamethoxazole -Trimethoprim ; Take 1 tablet by mouth 2 (two) times daily for 7 days.  Dispense: 14 tablet; Refill: 0  Cellulitis of abdominal wall -     Sulfamethoxazole -Trimethoprim ; Take 1 tablet by mouth 2 (two) times daily for 7 days.  Dispense: 14 tablet; Refill: 0     Follow-up: Return in about 6 months (around 06/16/2025).  Debby Molt, MD "

## 2024-12-17 NOTE — Patient Instructions (Signed)
Premature Atrial Contraction  A premature atrial contraction [facility]) is a kind of irregular heartbeat (arrhythmia). The heart has four chambers, including the upper chambers (atria) and lower chambers (ventricles). Normally, an electrical signal starts in a group of cells called the sinoatrial node (SA node) and travels through the atria, causing them to pump blood into the ventricles. During a PAC, the atria beat too early, before they have had time to fill with blood. The heartbeat pauses afterward so the heart can fill with blood for the next beat. Sometimes PAC can be a warning sign of another type of arrhythmia called atrial fibrillation. Atrial fibrillation may allow blood to pool in the atria and form clots. If a clot travels to the brain, it can cause a stroke. What are the causes? The cause of this condition is often unknown. Sometimes, this condition may be caused by heart disease or injury to the heart. What increases the risk? You are more likely to develop this condition if: You have heart disease. You are 40 years of age or older. Episodes may be triggered by: Tobacco, alcohol, or caffeine use. Stimulant drugs. Some medicines or supplements. Stress and anxiety. What are the signs or symptoms? Symptoms of this condition include: The feeling of a pause in the heartbeat. The first heartbeat after the "skipped" beat may feel more forceful. A feeling that your heart is fluttering. A quick feeling of dizziness or faintness. How is this diagnosed? This condition is diagnosed based on: Your medical history or symptoms. A physical exam. Your health care provider may listen to your heart. An ECG (electrocardiogram) to monitor the electrical activity of your heart. An ambulatory cardiac monitor that records your heartbeats for 24 hours or more. You may also have: Blood tests. An echocardiogram, which creates an image of your heart. How is this treated? Treatment depends on how often  your symptoms happen and other risk factors. Treatments may include: Medicines. A catheter ablation procedure to destroy the part of the heart tissue that sends abnormal signals. In many cases, treatment may not be needed. Follow these instructions at home: Lifestyle  Do not use any products that contain nicotine or tobacco. These products include cigarettes, chewing tobacco, and vaping devices, such as e-cigarettes. If you need help quitting, ask your health care provider. Exercise regularly. Ask your health care provider what type of exercise is safe for you. Try to get at least 7-9 hours of sleep each night. Find healthy ways to manage stress. Avoid stressful situations when possible. Alcohol use Do not drink alcohol if: Your health care provider tells you not to drink. You are pregnant, may be pregnant, or are planning to become pregnant. Alcohol triggers your episodes. If you drink alcohol: Limit how much you have to: 0-1 drink a day for women. 0-2 drinks a day for men. Know how much alcohol is in your drink. In the [place] one drink equals one 12 oz bottle of beer (355 mL), one 5 oz glass of wine (148 mL), or one 1 oz glass of hard liquor (44 mL). General instructions Take over-the-counter and prescription medicines only as told by your health care provider. If caffeine triggers episodes, do not eat, drink, or use anything with caffeine in it. Contact a health care provider if: You feel your heart is fluttering. Your heart skips beats and you feel dizzy, light-headed, or very tired. Get help right away if: You have chest pain. You have trouble breathing. You faint. You have any symptoms of  a stroke. "BE FAST" is an easy way to remember the main warning signs of a stroke: B - Balance. Signs are dizziness, sudden trouble walking, or loss of balance. E - Eyes. Signs are trouble seeing or a sudden change in vision. F - Face. Signs are sudden weakness or numbness of the face, or the  face or eyelid drooping on one side. A - Arms. Signs are weakness or numbness in an arm. This happens suddenly and usually on one side of the body. S - Speech. Signs are sudden trouble speaking, slurred speech, or trouble understanding what people say. T - Time. Time to call emergency services. Write down what time symptoms started. You have other signs of stroke, such as: A sudden, severe headache with no known cause. Nausea or vomiting. Seizure. These symptoms may be an emergency. Get help right away. Call 911. Do not wait to see if the symptoms will go away. Do not drive yourself to the hospital. This information is not intended to replace advice given to you by your health care provider. Make sure you discuss any questions you have with your health care provider. Document Revised: 04/30/2022 Document Reviewed: 04/30/2022 Elsevier Patient Education  2024 [employer].

## 2024-12-18 ENCOUNTER — Telehealth: Payer: Self-pay

## 2024-12-18 ENCOUNTER — Ambulatory Visit (INDEPENDENT_AMBULATORY_CARE_PROVIDER_SITE_OTHER): Admitting: Podiatrist

## 2024-12-18 DIAGNOSIS — M216X1 Other acquired deformities of right foot: Secondary | ICD-10-CM

## 2024-12-18 DIAGNOSIS — M19072 Primary osteoarthritis, left ankle and foot: Secondary | ICD-10-CM

## 2024-12-18 DIAGNOSIS — M216X2 Other acquired deformities of left foot: Secondary | ICD-10-CM

## 2024-12-18 NOTE — Progress Notes (Signed)

## 2024-12-18 NOTE — Telephone Encounter (Signed)
 Orthotics are in [place] office. Tried calling patient, phone rang then recording came on saying call can't be completed.Sending a fpl group.

## 2025-01-10 ENCOUNTER — Other Ambulatory Visit: Payer: Self-pay | Admitting: Internal Medicine

## 2025-01-10 DIAGNOSIS — K2100 Gastro-esophageal reflux disease with esophagitis, without bleeding: Secondary | ICD-10-CM

## 2025-01-16 ENCOUNTER — Other Ambulatory Visit: Payer: Self-pay | Admitting: Podiatry

## 2025-03-06 ENCOUNTER — Ambulatory Visit: Admitting: Internal Medicine

## 2025-06-17 ENCOUNTER — Ambulatory Visit: Admitting: Internal Medicine
